# Patient Record
Sex: Male | Born: 1955 | Race: White | Hispanic: No | Marital: Single | Smoking: Current every day smoker
Health system: Southern US, Community
[De-identification: ages and names within clinical notes are randomized; demographics above are authoritative.]

## PROBLEM LIST (undated history)

## (undated) DIAGNOSIS — B1920 Unspecified viral hepatitis C without hepatic coma: Secondary | ICD-10-CM

## (undated) DIAGNOSIS — Z550 Illiteracy and low-level literacy: Secondary | ICD-10-CM

## (undated) DIAGNOSIS — C801 Malignant (primary) neoplasm, unspecified: Secondary | ICD-10-CM

## (undated) DIAGNOSIS — D649 Anemia, unspecified: Secondary | ICD-10-CM

## (undated) DIAGNOSIS — F419 Anxiety disorder, unspecified: Secondary | ICD-10-CM

## (undated) DIAGNOSIS — Z789 Other specified health status: Secondary | ICD-10-CM

## (undated) DIAGNOSIS — I739 Peripheral vascular disease, unspecified: Secondary | ICD-10-CM

## (undated) DIAGNOSIS — K219 Gastro-esophageal reflux disease without esophagitis: Secondary | ICD-10-CM

## (undated) DIAGNOSIS — F1090 Alcohol use, unspecified, uncomplicated: Secondary | ICD-10-CM

## (undated) DIAGNOSIS — I8500 Esophageal varices without bleeding: Secondary | ICD-10-CM

## (undated) DIAGNOSIS — I10 Essential (primary) hypertension: Secondary | ICD-10-CM

## (undated) DIAGNOSIS — Z720 Tobacco use: Secondary | ICD-10-CM

## (undated) DIAGNOSIS — Z7289 Other problems related to lifestyle: Secondary | ICD-10-CM

## (undated) DIAGNOSIS — B182 Chronic viral hepatitis C: Secondary | ICD-10-CM

## (undated) DIAGNOSIS — J449 Chronic obstructive pulmonary disease, unspecified: Secondary | ICD-10-CM

## (undated) DIAGNOSIS — M1990 Unspecified osteoarthritis, unspecified site: Secondary | ICD-10-CM

## (undated) DIAGNOSIS — K7460 Unspecified cirrhosis of liver: Secondary | ICD-10-CM

## (undated) DIAGNOSIS — F1010 Alcohol abuse, uncomplicated: Secondary | ICD-10-CM

## (undated) DIAGNOSIS — R188 Other ascites: Secondary | ICD-10-CM

## (undated) HISTORY — PX: PARACENTESIS: SHX844

## (undated) HISTORY — PX: SPINE SURGERY: SHX786

## (undated) HISTORY — PX: BACK SURGERY: SHX140

---

## 2004-03-03 ENCOUNTER — Inpatient Hospital Stay: Payer: Self-pay | Admitting: Internal Medicine

## 2004-03-03 ENCOUNTER — Other Ambulatory Visit: Payer: Self-pay

## 2006-06-30 ENCOUNTER — Ambulatory Visit: Payer: Self-pay | Admitting: Pain Medicine

## 2006-07-07 ENCOUNTER — Emergency Department: Payer: Self-pay | Admitting: Emergency Medicine

## 2006-07-07 ENCOUNTER — Ambulatory Visit: Payer: Self-pay | Admitting: Pain Medicine

## 2006-07-16 ENCOUNTER — Emergency Department: Payer: Self-pay | Admitting: Emergency Medicine

## 2006-07-26 ENCOUNTER — Ambulatory Visit: Payer: Self-pay | Admitting: Pain Medicine

## 2007-01-21 ENCOUNTER — Ambulatory Visit: Payer: Self-pay | Admitting: Family Medicine

## 2010-05-12 ENCOUNTER — Ambulatory Visit: Payer: Self-pay | Admitting: Internal Medicine

## 2011-12-07 ENCOUNTER — Emergency Department: Payer: Self-pay | Admitting: Emergency Medicine

## 2011-12-07 LAB — CBC
HCT: 46.2 % (ref 40.0–52.0)
HGB: 16.7 g/dL (ref 13.0–18.0)
MCH: 33.4 pg (ref 26.0–34.0)
MCHC: 36.1 g/dL — ABNORMAL HIGH (ref 32.0–36.0)
Platelet: 147 10*3/uL — ABNORMAL LOW (ref 150–440)

## 2011-12-07 LAB — COMPREHENSIVE METABOLIC PANEL
Alkaline Phosphatase: 114 U/L (ref 50–136)
BUN: 10 mg/dL (ref 7–18)
Bilirubin,Total: 0.4 mg/dL (ref 0.2–1.0)
Chloride: 100 mmol/L (ref 98–107)
Co2: 23 mmol/L (ref 21–32)
Creatinine: 0.67 mg/dL (ref 0.60–1.30)
EGFR (African American): >60
EGFR (Non-African Amer.): >60
Osmolality: 266 (ref 275–301)
Sodium: 133 mmol/L — ABNORMAL LOW (ref 136–145)

## 2011-12-07 LAB — PROTIME-INR: INR: 0.9

## 2011-12-16 ENCOUNTER — Ambulatory Visit: Payer: Self-pay | Admitting: Family Medicine

## 2013-02-24 DIAGNOSIS — G894 Chronic pain syndrome: Secondary | ICD-10-CM | POA: Insufficient documentation

## 2015-07-02 DIAGNOSIS — B182 Chronic viral hepatitis C: Secondary | ICD-10-CM | POA: Insufficient documentation

## 2016-09-07 ENCOUNTER — Other Ambulatory Visit: Payer: Self-pay | Admitting: Gastroenterology

## 2016-09-07 DIAGNOSIS — R945 Abnormal results of liver function studies: Principal | ICD-10-CM

## 2016-09-07 DIAGNOSIS — B182 Chronic viral hepatitis C: Secondary | ICD-10-CM

## 2016-09-07 DIAGNOSIS — R7989 Other specified abnormal findings of blood chemistry: Secondary | ICD-10-CM

## 2016-09-16 ENCOUNTER — Ambulatory Visit: Payer: Medicaid Other

## 2016-12-24 ENCOUNTER — Emergency Department: Payer: Medicaid Other

## 2016-12-24 ENCOUNTER — Encounter: Payer: Self-pay | Admitting: Emergency Medicine

## 2016-12-24 ENCOUNTER — Inpatient Hospital Stay
Admission: EM | Admit: 2016-12-24 | Discharge: 2016-12-27 | DRG: 433 | Disposition: A | Discharge status: Home / Self Care | Payer: Medicaid Other | Attending: Internal Medicine | Admitting: Internal Medicine

## 2016-12-24 DIAGNOSIS — D6959 Other secondary thrombocytopenia: Secondary | ICD-10-CM | POA: Diagnosis present

## 2016-12-24 DIAGNOSIS — K5900 Constipation, unspecified: Secondary | ICD-10-CM | POA: Diagnosis present

## 2016-12-24 DIAGNOSIS — Z7289 Other problems related to lifestyle: Secondary | ICD-10-CM

## 2016-12-24 DIAGNOSIS — R39198 Other difficulties with micturition: Secondary | ICD-10-CM | POA: Diagnosis not present

## 2016-12-24 DIAGNOSIS — J441 Chronic obstructive pulmonary disease with (acute) exacerbation: Secondary | ICD-10-CM | POA: Diagnosis present

## 2016-12-24 DIAGNOSIS — K7031 Alcoholic cirrhosis of liver with ascites: Secondary | ICD-10-CM | POA: Diagnosis present

## 2016-12-24 DIAGNOSIS — Z79899 Other long term (current) drug therapy: Secondary | ICD-10-CM

## 2016-12-24 DIAGNOSIS — I10 Essential (primary) hypertension: Secondary | ICD-10-CM | POA: Diagnosis present

## 2016-12-24 DIAGNOSIS — R188 Other ascites: Secondary | ICD-10-CM | POA: Diagnosis present

## 2016-12-24 DIAGNOSIS — R109 Unspecified abdominal pain: Secondary | ICD-10-CM

## 2016-12-24 DIAGNOSIS — D684 Acquired coagulation factor deficiency: Secondary | ICD-10-CM | POA: Diagnosis present

## 2016-12-24 DIAGNOSIS — F17210 Nicotine dependence, cigarettes, uncomplicated: Secondary | ICD-10-CM | POA: Diagnosis present

## 2016-12-24 DIAGNOSIS — R1084 Generalized abdominal pain: Secondary | ICD-10-CM

## 2016-12-24 DIAGNOSIS — B182 Chronic viral hepatitis C: Secondary | ICD-10-CM | POA: Diagnosis present

## 2016-12-24 HISTORY — DX: Unspecified viral hepatitis C without hepatic coma: B19.20

## 2016-12-24 HISTORY — DX: Essential (primary) hypertension: I10

## 2016-12-24 HISTORY — DX: Chronic obstructive pulmonary disease, unspecified: J44.9

## 2016-12-24 LAB — COMPREHENSIVE METABOLIC PANEL
ALBUMIN: 3.8 g/dL (ref 3.5–5.0)
ALK PHOS: 77 U/L (ref 38–126)
ALT: 46 U/L (ref 17–63)
ANION GAP: 9 (ref 5–15)
AST: 74 U/L — ABNORMAL HIGH (ref 15–41)
BILIRUBIN TOTAL: 3.3 mg/dL — AB (ref 0.3–1.2)
BUN: 7 mg/dL (ref 6–20)
CALCIUM: 9.2 mg/dL (ref 8.9–10.3)
CO2: 27 mmol/L (ref 22–32)
CREATININE: 0.76 mg/dL (ref 0.61–1.24)
Chloride: 94 mmol/L — ABNORMAL LOW (ref 101–111)
GFR calc Af Amer: >60 mL/min (ref >60)
GFR calc non Af Amer: >60 mL/min (ref >60)
GLUCOSE: 114 mg/dL — AB (ref 65–99)
Potassium: 3.6 mmol/L (ref 3.5–5.1)
SODIUM: 130 mmol/L — AB (ref 135–145)
TOTAL PROTEIN: 8.3 g/dL — AB (ref 6.5–8.1)

## 2016-12-24 LAB — CBC
HCT: 43.7 % (ref 40.0–52.0)
Hemoglobin: 15.3 g/dL (ref 13.0–18.0)
MCH: 32.6 pg (ref 26.0–34.0)
MCHC: 35 g/dL (ref 32.0–36.0)
MCV: 93.2 fL (ref 80.0–100.0)
PLATELETS: 123 10*3/uL — AB (ref 150–440)
RBC: 4.69 MIL/uL (ref 4.40–5.90)
RDW: 14.3 % (ref 11.5–14.5)
WBC: 7.6 10*3/uL (ref 3.8–10.6)

## 2016-12-24 LAB — PATHOLOGIST SMEAR REVIEW

## 2016-12-24 LAB — PROTIME-INR
INR: 1.26
Prothrombin Time: 15.7 seconds — ABNORMAL HIGH (ref 11.4–15.2)

## 2016-12-24 LAB — AMYLASE, PLEURAL OR PERITONEAL FLUID: Amylase, Fluid: 14 U/L

## 2016-12-24 LAB — BODY FLUID CELL COUNT WITH DIFFERENTIAL
EOS FL: 0 %
Lymphs, Fluid: 56 %
Monocyte-Macrophage-Serous Fluid: 32 %
NEUTROPHIL FLUID: 12 %
Total Nucleated Cell Count, Fluid: 294 cu mm

## 2016-12-24 LAB — URINALYSIS, COMPLETE (UACMP) WITH MICROSCOPIC
Bacteria, UA: NONE SEEN
Bilirubin Urine: NEGATIVE
GLUCOSE, UA: NEGATIVE mg/dL
HGB URINE DIPSTICK: NEGATIVE
Ketones, ur: NEGATIVE mg/dL
Leukocytes, UA: NEGATIVE
NITRITE: NEGATIVE
PH: 6 (ref 5.0–8.0)
PROTEIN: NEGATIVE mg/dL
SPECIFIC GRAVITY, URINE: 1.014 (ref 1.005–1.030)
Squamous Epithelial / LPF: NONE SEEN

## 2016-12-24 LAB — ALBUMIN, PLEURAL OR PERITONEAL FLUID

## 2016-12-24 LAB — TROPONIN I: Troponin I: <0.03 ng/mL (ref <0.03)

## 2016-12-24 LAB — APTT: APTT: 31 s (ref 24–36)

## 2016-12-24 LAB — LIPASE, BLOOD: Lipase: 20 U/L (ref 11–51)

## 2016-12-24 LAB — LACTATE DEHYDROGENASE, PLEURAL OR PERITONEAL FLUID: LD FL: 36 U/L — AB (ref 3–23)

## 2016-12-24 MED ORDER — METHYLPREDNISOLONE SODIUM SUCC 125 MG IJ SOLR
125.0000 mg | Freq: Once | INTRAMUSCULAR | Status: AC
  Administered 2016-12-24: 125 mg via INTRAVENOUS
  Filled 2016-12-24: qty 2

## 2016-12-24 MED ORDER — ALBUTEROL SULFATE (2.5 MG/3ML) 0.083% IN NEBU
2.5000 mg | INHALATION_SOLUTION | RESPIRATORY_TRACT | Status: DC | PRN
  Administered 2016-12-24: 2.5 mg via RESPIRATORY_TRACT
  Filled 2016-12-24: qty 3

## 2016-12-24 MED ORDER — CLONAZEPAM 0.5 MG PO TABS
0.5000 mg | ORAL_TABLET | Freq: Two times a day (BID) | ORAL | Status: DC | PRN
  Administered 2016-12-24 – 2016-12-27 (×6): 0.5 mg via ORAL
  Filled 2016-12-24 (×8): qty 1

## 2016-12-24 MED ORDER — MORPHINE SULFATE (PF) 4 MG/ML IV SOLN
4.0000 mg | Freq: Once | INTRAVENOUS | Status: AC
  Administered 2016-12-24: 4 mg via INTRAVENOUS
  Filled 2016-12-24: qty 1

## 2016-12-24 MED ORDER — TRAMADOL HCL 50 MG PO TABS
50.0000 mg | ORAL_TABLET | Freq: Four times a day (QID) | ORAL | Status: DC | PRN
  Administered 2016-12-24 – 2016-12-27 (×7): 50 mg via ORAL
  Filled 2016-12-24 (×7): qty 1

## 2016-12-24 MED ORDER — ONDANSETRON HCL 4 MG/2ML IJ SOLN
4.0000 mg | Freq: Once | INTRAMUSCULAR | Status: AC
  Administered 2016-12-24: 4 mg via INTRAVENOUS
  Filled 2016-12-24: qty 2

## 2016-12-24 MED ORDER — PREDNISONE 50 MG PO TABS
50.0000 mg | ORAL_TABLET | Freq: Every day | ORAL | Status: DC
  Administered 2016-12-25 – 2016-12-27 (×3): 50 mg via ORAL
  Filled 2016-12-24 (×3): qty 1

## 2016-12-24 MED ORDER — MOMETASONE FURO-FORMOTEROL FUM 200-5 MCG/ACT IN AERO: 2.0000 | INHALATION_SPRAY | Freq: Two times a day (BID) | RESPIRATORY_TRACT | Status: DC

## 2016-12-24 MED ORDER — SPIRONOLACTONE 25 MG PO TABS
25.0000 mg | ORAL_TABLET | Freq: Every day | ORAL | Status: DC
  Administered 2016-12-24 – 2016-12-25 (×2): 25 mg via ORAL
  Filled 2016-12-24 (×2): qty 1

## 2016-12-24 MED ORDER — FUROSEMIDE 10 MG/ML IJ SOLN
20.0000 mg | Freq: Two times a day (BID) | INTRAMUSCULAR | Status: DC
  Administered 2016-12-24 – 2016-12-27 (×6): 20 mg via INTRAVENOUS
  Filled 2016-12-24 (×6): qty 4

## 2016-12-24 MED ORDER — NICOTINE 7 MG/24HR TD PT24
7.0000 mg | MEDICATED_PATCH | Freq: Every day | TRANSDERMAL | Status: DC
  Administered 2016-12-24 – 2016-12-27 (×5): 7 mg via TRANSDERMAL
  Filled 2016-12-24 (×5): qty 1

## 2016-12-24 MED ORDER — ONDANSETRON HCL 4 MG PO TABS: 4.0000 mg | ORAL_TABLET | Freq: Four times a day (QID) | ORAL | Status: DC | PRN

## 2016-12-24 MED ORDER — DOCUSATE SODIUM 100 MG PO CAPS
100.0000 mg | ORAL_CAPSULE | Freq: Two times a day (BID) | ORAL | Status: DC
  Administered 2016-12-24 – 2016-12-25 (×3): 100 mg via ORAL
  Filled 2016-12-24 (×3): qty 1

## 2016-12-24 MED ORDER — DEXTROSE 5 % IV SOLN
500.0000 mg | Freq: Once | INTRAVENOUS | Status: AC
  Administered 2016-12-24: 500 mg via INTRAVENOUS
  Filled 2016-12-24: qty 500

## 2016-12-24 MED ORDER — MOMETASONE FURO-FORMOTEROL FUM 200-5 MCG/ACT IN AERO
2.0000 | INHALATION_SPRAY | Freq: Two times a day (BID) | RESPIRATORY_TRACT | Status: DC
  Administered 2016-12-24 – 2016-12-25 (×2): 2 via RESPIRATORY_TRACT
  Filled 2016-12-24: qty 8.8

## 2016-12-24 MED ORDER — ONDANSETRON HCL 4 MG/2ML IJ SOLN: 4.0000 mg | Freq: Four times a day (QID) | INTRAMUSCULAR | Status: DC | PRN

## 2016-12-24 MED ORDER — IPRATROPIUM-ALBUTEROL 0.5-2.5 (3) MG/3ML IN SOLN
9.0000 mL | Freq: Once | RESPIRATORY_TRACT | Status: AC
  Administered 2016-12-24: 9 mL via RESPIRATORY_TRACT
  Filled 2016-12-24: qty 9

## 2016-12-24 MED ORDER — ALBUTEROL SULFATE HFA 108 (90 BASE) MCG/ACT IN AERS: 2.0000 | INHALATION_SPRAY | RESPIRATORY_TRACT | Status: DC | PRN

## 2016-12-24 NOTE — ED Provider Notes (Signed)
[facility] Emergency Department Provider Note  ____________________________________________   First MD Initiated Contact with Patient 12/24/16 0840     (approximate)  I have reviewed the triage vital signs and the nursing notes.   HISTORY  Chief Complaint Abdominal Pain and Shortness of Breath   HPI Bradley Dunlap is a 61 y.o. male with a history of hepatitis C as well as liver cirrhosis was presenting to the emergency department with worsening abdominal pain and distention over the past several weeks. He says that it is now gotten to the point where he is having trouble breathing. He says his pain is a "12 out of 10." He denies ever having a paracentesis in the past. Says that he hasn't had anything to drink for one half months at this time.   Past Medical History:  Diagnosis Date  . COPD (chronic obstructive pulmonary disease) ([place])   . Hepatitis C   . Hypertension     There are no active problems to display for this patient.   No past surgical history on file.  Prior to Admission medications   Not on File    Allergies Patient has no known allergies.  No family history on file.  Social History Social History  Substance Use Topics  . Smoking status: Not on file  . Smokeless tobacco: Not on file  . Alcohol use Not on file    Review of Systems  Level V caveat secondary to altered mental status.  ____________________________________________   PHYSICAL EXAM:  VITAL SIGNS: ED Triage Vitals [12/24/16 0822]  Enc Vitals Group     BP 130/82     Pulse Rate (!) 110     Resp (!) 24     Temp 97.9 F (36.6 C)     Temp Source Oral     SpO2 97 %     Weight 230 lb (104.3 kg)     Height 5\' 8"  (1.727 m)     Head Circumference      Peak Flow      Pain Score 10     Pain Loc      Pain Edu?      Excl. in [place]?     Constitutional: Alert and oriented. Well appearing and in no acute distress. Eyes: Conjunctivae are normal.  Head:  Atraumatic. Nose: No congestion/rhinnorhea. Mouth/Throat: Mucous membranes are moist.  Neck: No stridor.   Cardiovascular: Normal rate, regular rhythm. Grossly normal heart sounds.   Respiratory: Normal respiratory effort.  No retractions. Wheezing throughout all fields. Prolonged history phase. Gastrointestinal: Tense with a large amount of distention and diffusely tender. No CVA tenderness. Musculoskeletal: No lower extremity tenderness nor edema.  No joint effusions. Neurologic:  Normal speech and language. No gross focal neurologic deficits are appreciated. Skin:  Skin is warm, dry and intact. No rash noted. Psychiatric: Mood and affect are normal. Speech and behavior are normal.  ____________________________________________   LABS (all labs ordered are listed, but only abnormal results are displayed)  Labs Reviewed  COMPREHENSIVE METABOLIC PANEL - Abnormal; Notable for the following:       Result Value   Sodium 130 (*)    Chloride 94 (*)    Glucose, Bld 114 (*)    Total Protein 8.3 (*)    AST 74 (*)    Total Bilirubin 3.3 (*)    All other components within normal limits  CBC - Abnormal; Notable for the following:    Platelets 123 (*)  All other components within normal limits  LIPASE, BLOOD  URINALYSIS, COMPLETE (UACMP) WITH MICROSCOPIC  TROPONIN I  PROTIME-INR  APTT   ____________________________________________  EKG   ED ECG REPORT I, Journiee Feldkamp,  Youlanda Roys, the attending physician, personally viewed and interpreted this ECG.   Date: 12/24/2016  EKG Time: 0 852  Rate: 102  Rhythm: normal EKG, normal sinus rhythm, unchanged from previous tracings, sinus tachycardia. EKG machine read as atrial fibrillation but believe this is due to baseline disturbance. There T waves seen in V4, V5 and V6 before every QRS.  Axis: Normal  Intervals:nonspecific intraventricular conduction delay  ST&T Change: No ST segment elevation or depression. No abnormal T-wave  inversion.  ____________________________________________  RADIOLOGY  Low lung vitals without any acute cardiopulmonary disease. ____________________________________________   PROCEDURES  Procedure(s) performed:   Procedures  Critical Care performed:   ____________________________________________   INITIAL IMPRESSION / ASSESSMENT AND PLAN / ED COURSE  Pertinent labs & imaging results that were available during my care of the patient were reviewed by me and considered in my medical decision making (see chart for details).  ----------------------------------------- 12:17 PM on 12/24/2016 -----------------------------------------  Patient had paracentesis and is feeling especially improved but still with diffuse abdominal tenderness to palpation. Also with continual wheezing despite COPD treatment. Patient will be admitted to the hospital. Pending abdominal fluid labs at this time. Signed out to Dr. Posey Pronto. Patient is aware of the need for Mr. the hospital for further workup and is willing to comply. Likely COPD exacerbation from abdominal competition because of his large volume ascites.      ____________________________________________   FINAL CLINICAL IMPRESSION(S) / ED DIAGNOSES  Final diagnoses:  Abdominal pain  CPD. Abdominal ascites.    NEW MEDICATIONS STARTED DURING THIS VISIT:  New Prescriptions   No medications on file     Note:  This document was prepared using Dragon voice recognition software and may include unintentional dictation errors.     Orbie Pyo, MD 12/24/16 [phone]

## 2016-12-24 NOTE — H&P (Signed)
[facility] NAME: Mccrae Speciale    MR#:  [id number]  DATE OF BIRTH:  1956/01/27  DATE OF ADMISSION:  12/24/2016  PRIMARY CARE PHYSICIAN: Patient, No Pcp Per   REQUESTING/REFERRING PHYSICIAN: Dr Cinda Quest  CHIEF COMPLAINT:   Increasing shortness of breath and bloating but distended abdomen for 2 weeks HISTORY OF PRESENT ILLNESS:  Galo Sayed  is a 61 y.o. male with a known history of cirrhosis of liver secondary to hepatitis C, COPD with ongoing tobacco abuse, hypertension comes to the emergency room with increasing shortness of breath and abdominal distention. Patient reports difficulty walking around given his significant abdominal distention. He was found to have massive ascites and underwent paracentesis ultrasound-guided with 5 L. Patient is still quite distended with tight and has significant amount of fluid still remaining. He was wheezing earlier received breathing treatments. He is being admitted with acute COPD exacerbation with massive ascites secondary to cirrhosis of liver due to hepatitis C  PAST MEDICAL HISTORY:   Past Medical History:  Diagnosis Date  . COPD (chronic obstructive pulmonary disease) ([place])   . Hepatitis C   . Hypertension     PAST SURGICAL HISTOIRY:   Past Surgical History:  Procedure Laterality Date  . BACK SURGERY      SOCIAL HISTORY:   Social History  Substance Use Topics  . Smoking status: Current Every Day Smoker    Packs/day: 0.50    Types: Cigarettes  . Smokeless tobacco: Former Systems developer  . Alcohol use 1.8 oz/week    3 Cans of beer per week     Comment: no alcohol in 4 or 5 weeks    FAMILY HISTORY:  History reviewed. No pertinent family history.  DRUG ALLERGIES:  No Known Allergies  REVIEW OF SYSTEMS:  Review of Systems  Constitutional: Negative for chills, fever and weight loss.  HENT: Negative for ear discharge, ear pain and nosebleeds.   Eyes: Negative for blurred  vision, pain and discharge.  Respiratory: Positive for shortness of breath. Negative for sputum production, wheezing and stridor.   Cardiovascular: Negative for chest pain, palpitations, orthopnea and PND.  Gastrointestinal: Positive for abdominal pain. Negative for diarrhea, nausea and vomiting.  Genitourinary: Negative for frequency and urgency.  Musculoskeletal: Negative for back pain and joint pain.  Neurological: Positive for weakness. Negative for sensory change, speech change and focal weakness.  Psychiatric/Behavioral: Negative for depression and hallucinations. The patient is not nervous/anxious.      MEDICATIONS AT HOME:   Prior to Admission medications   Medication Sig Start Date End Date Taking? Authorizing Provider  Fluticasone-Salmeterol (ADVAIR DISKUS) 250-50 MCG/DOSE AEPB Inhale 1 puff into the lungs 2 (two) times daily. 08/14/16  Yes [provider]  albuterol (PROVENTIL HFA;VENTOLIN HFA) 108 (90 Base) MCG/ACT inhaler Inhale 2 puffs into the lungs every 4 (four) hours as needed for wheezing. 08/14/16   [provider]  albuterol (PROVENTIL) (2.5 MG/3ML) 0.083% nebulizer solution Take 2.5 mg by nebulization every 4 (four) hours as needed for wheezing. 08/14/16   [provider]  clonazePAM (KLONOPIN) 0.5 MG tablet Take 0.5 mg by mouth 2 (two) times daily as needed for anxiety. 08/14/16   [provider]      VITAL SIGNS:  Blood pressure (!) 125/91, pulse 91, temperature (!) 97.5 F (36.4 C), temperature source Oral, resp. rate 18, height 5\' 8"  (1.727 m), weight 104.3 kg (230 lb), SpO2 95 %.  PHYSICAL EXAMINATION:  GENERAL:  61 y.o.-year-old patient lying in the bed with no acute distress. dishevelrd EYES: Pupils equal, round, reactive to light and accommodation. No scleral icterus. Extraocular muscles intact.  HEENT: Head atraumatic, normocephalic. Oropharynx and nasopharynx clear.  NECK:  Supple, no jugular venous distention. No thyroid  enlargement, no tenderness.  LUNGS: distantbreath sounds bilaterally, scattered wheezing, no rales,rhonchi or crepitation. No use of accessory muscles of respiration.  CARDIOVASCULAR: S1, S2 normal. No murmurs, rubs, or gallops.  ABDOMEN: Soft, nontender, nondistended. Bowel sounds present. No organomegaly or mass.  EXTREMITIES: No pedal edema, cyanosis, or clubbing.  NEUROLOGIC: Cranial nerves II through XII are intact. Muscle strength 5/5 in all extremities. Sensation intact. Gait not checked.  PSYCHIATRIC: The patient is alert and oriented x 3.  SKIN: No obvious rash, lesion, or ulcer.   LABORATORY PANEL:   CBC  Recent Labs Lab 12/24/16 0825  WBC 7.6  HGB 15.3  HCT 43.7  PLT 123*   ------------------------------------------------------------------------------------------------------------------  Chemistries   Recent Labs Lab 12/24/16 0825  NA 130*  K 3.6  CL 94*  CO2 27  GLUCOSE 114*  BUN 7  CREATININE 0.76  CALCIUM 9.2  AST 74*  ALT 46  ALKPHOS 77  BILITOT 3.3*   ------------------------------------------------------------------------------------------------------------------  Cardiac Enzymes  Recent Labs Lab 12/24/16 0854  TROPONINI <0.03   ------------------------------------------------------------------------------------------------------------------  RADIOLOGY:  Dg Chest 1 View  Result Date: 12/24/2016 CLINICAL DATA:  Abdominal pain. Shortness of breath. History of cirrhosis. EXAM: CHEST 1 VIEW COMPARISON:  12/07/2011. FINDINGS: Mediastinum hilar structures normal. Heart size normal. No focal infiltrate. Low lung volumes. No pleural effusion or pneumothorax. IMPRESSION: Low lung volumes.  No acute cardiopulmonary disease identified. Electronically Signed   By: Marcello Moores  Register   On: 12/24/2016 09:07    EKG:    IMPRESSION AND PLAN:   Shawntez Dickison  is a 61 y.o. male with a known history of cirrhosis of liver secondary to hepatitis C, COPD with  ongoing tobacco abuse, hypertension comes to the emergency room with increasing shortness of breath and abdominal distention  1. Massive ascites secondary to certain known history of cirrhosis of liver secondary to chronic hepatitis C -patient has been seen with [facility] GI clinic -he underwent ultrasound-guided paracentesis with removal of 5 L of fluid. -we'll order another ultrasound-guided paracentesis for tomorrow -Started on Lasix IV 20 twice a day and spironolactone -Patient will need to follow up with  GI clinic routine paracentesis if he feels upquickly  2.Acute COPD exacerbation with ongoing tobacco abuse -Received IV Solu-Medrol in the ER, continue nebulizers or inhalers -Oral prednisone taper  3. Hypertension -currently on Lasix and spironolactone -Add BP meds depending on blood pressure  4.chronic hyperbilirubinemia secondary to chronic cirrhosis of liver  5. Hypercoagulability secondary to cirrhosis of liver -No active bleeding  6. DVT prophylaxis  SCD  All the records are reviewed and case discussed with ED provider. Management plans discussed with the patient, family and they are in agreement.  CODE STATUS: full  TOTAL TIME TAKING CARE OF THIS PATIENT: *50* minutes.    Avacyn Kloosterman M.D on 12/24/2016 at 4:05 PM  Between 7am to 6pm - Pager - [phone]  After 6pm go to [email or web address] - password [email or web address] [email or web address]  Office  [phone]  CC: Primary care physician; Patient, No Pcp Per

## 2016-12-24 NOTE — ED Notes (Signed)
Pt returned from [place], resting in bed in no distress

## 2016-12-24 NOTE — Progress Notes (Signed)
Patient smokes 1/2 PPD or less.  Order received for nicoderm

## 2016-12-24 NOTE — ED Notes (Signed)
Patient transported to Ultrasound 

## 2016-12-24 NOTE — ED Triage Notes (Signed)
Pt reports increasing fluid collection on abdomen. Pt reports abdominal pain and shortness of breath. Pt reports history of cirrhosis and hepatitis C.

## 2016-12-24 NOTE — ED Notes (Signed)
Pt resting in bed, denies any needs, awake and alert

## 2016-12-25 ENCOUNTER — Inpatient Hospital Stay: Payer: Medicaid Other

## 2016-12-25 LAB — BASIC METABOLIC PANEL
Anion gap: 7 (ref 5–15)
BUN: 9 mg/dL (ref 6–20)
CALCIUM: 8.8 mg/dL — AB (ref 8.9–10.3)
CHLORIDE: 96 mmol/L — AB (ref 101–111)
CO2: 29 mmol/L (ref 22–32)
CREATININE: 0.7 mg/dL (ref 0.61–1.24)
Glucose, Bld: 150 mg/dL — ABNORMAL HIGH (ref 65–99)
Potassium: 3.8 mmol/L (ref 3.5–5.1)
SODIUM: 132 mmol/L — AB (ref 135–145)

## 2016-12-25 MED ORDER — BUDESONIDE 0.5 MG/2ML IN SUSP
0.5000 mg | Freq: Two times a day (BID) | RESPIRATORY_TRACT | Status: DC
  Administered 2016-12-25 – 2016-12-27 (×5): 0.5 mg via RESPIRATORY_TRACT
  Filled 2016-12-25 (×5): qty 2

## 2016-12-25 MED ORDER — LACTULOSE 10 GM/15ML PO SOLN
30.0000 g | Freq: Every day | ORAL | Status: DC
  Administered 2016-12-25: 30 g via ORAL
  Filled 2016-12-25: qty 60

## 2016-12-25 MED ORDER — CEFTRIAXONE SODIUM 1 G IJ SOLR
1.0000 g | Freq: Every day | INTRAMUSCULAR | Status: DC
  Administered 2016-12-25 – 2016-12-26 (×2): 1 g via INTRAVENOUS
  Filled 2016-12-25 (×3): qty 10

## 2016-12-25 MED ORDER — SPIRONOLACTONE 25 MG PO TABS
25.0000 mg | ORAL_TABLET | Freq: Two times a day (BID) | ORAL | Status: DC
  Administered 2016-12-25 – 2016-12-27 (×4): 25 mg via ORAL
  Filled 2016-12-25 (×4): qty 1

## 2016-12-25 MED ORDER — ALBUTEROL SULFATE (2.5 MG/3ML) 0.083% IN NEBU
2.5000 mg | INHALATION_SOLUTION | Freq: Four times a day (QID) | RESPIRATORY_TRACT | Status: DC
  Administered 2016-12-25 – 2016-12-27 (×7): 2.5 mg via RESPIRATORY_TRACT
  Filled 2016-12-25 (×9): qty 3

## 2016-12-25 NOTE — Progress Notes (Signed)
Patient ID: Bradley Dunlap, male   DOB: 1955-12-18, 61 y.o.   MRN: [id number]  [employer] PROGRESS NOTE  Bradley Dunlap [id number] DOB: 1955/07/16 DOA: 12/24/2016 PCP: Patient, No Pcp Per  HPI/Subjective: The patient had 8.6 L taken off with paracentesis yesterday and today. He still feels distended in his abdomen. He is urinating well. He has a history of COPD and wheezing. He's complains of constipation.  Objective: Vitals:   12/25/16 0943 12/25/16 1227  BP: 108/65 119/68  Pulse: 84 81  Resp: 18   Temp: 98 F (36.7 C) 97.8 F (36.6 C)  SpO2: 93% 93%    Filed Weights   12/24/16 0822  Weight: 104.3 kg (230 lb)    ROS: Review of Systems  Constitutional: Negative for chills and fever.  Eyes: Negative for blurred vision.  Respiratory: Positive for shortness of breath and wheezing. Negative for cough.   Cardiovascular: Negative for chest pain.  Gastrointestinal: Positive for abdominal pain and constipation. Negative for diarrhea, nausea and vomiting.  Genitourinary: Negative for dysuria.  Musculoskeletal: Negative for joint pain.  Neurological: Negative for dizziness and headaches.   Exam: Physical Exam  Constitutional: He is oriented to person, place, and time.  HENT:  Nose: No mucosal edema.  Mouth/Throat: No oropharyngeal exudate or posterior oropharyngeal edema.  Eyes: Pupils are equal, round, and reactive to light. Conjunctivae, EOM and lids are normal.  Neck: No JVD present. Carotid bruit is not present. No edema present. No thyroid mass and no thyromegaly present.  Cardiovascular: S1 normal and S2 normal.  Exam reveals no gallop.   No murmur heard. Pulses:      Dorsalis pedis pulses are 2+ on the right side, and 2+ on the left side.  Respiratory: No respiratory distress. He has decreased breath sounds in the right middle field, the right lower field, the left middle field and the left lower field. He has wheezes in the right middle field, the right lower  field, the left middle field and the left lower field. He has no rhonchi. He has no rales.  GI: Soft. Bowel sounds are normal. He exhibits distension. There is no tenderness.  Musculoskeletal:       Right shoulder: He exhibits no swelling.  Lymphadenopathy:    He has no cervical adenopathy.  Neurological: He is alert and oriented to person, place, and time. No cranial nerve deficit.  Skin: Skin is warm. No rash noted. Nails show no clubbing.  Psychiatric: He has a normal mood and affect.      Data Reviewed: Basic Metabolic Panel:  Recent Labs Lab 12/24/16 0825 12/25/16 0322  NA 130* 132*  K 3.6 3.8  CL 94* 96*  CO2 27 29  GLUCOSE 114* 150*  BUN 7 9  CREATININE 0.76 0.70  CALCIUM 9.2 8.8*   Liver Function Tests:  Recent Labs Lab 12/24/16 0825  AST 74*  ALT 46  ALKPHOS 77  BILITOT 3.3*  PROT 8.3*  ALBUMIN 3.8    Recent Labs Lab 12/24/16 0825  LIPASE 20   CBC:  Recent Labs Lab 12/24/16 0825  WBC 7.6  HGB 15.3  HCT 43.7  MCV 93.2  PLT 123*   Cardiac Enzymes:  Recent Labs Lab 12/24/16 0854  TROPONINI <0.03    Recent Results (from the past 240 hour(s))  Body fluid culture     Status: None (Preliminary result)   Collection Time: 12/24/16 10:18 AM  Result Value Ref Range Status   Specimen Description PERITONEAL  Final  Special Requests NONE  Final   Gram Stain   Final    ABUNDANT WBC PRESENT, PREDOMINANTLY MONONUCLEAR NO ORGANISMS SEEN    Culture   Final    NO GROWTH < 24 HOURS Performed at [facility] [address], [place], [place] [address]    Report Status PENDING  Incomplete     Studies: Dg Chest 1 View  Result Date: 12/24/2016 CLINICAL DATA:  Abdominal pain. Shortness of breath. History of cirrhosis. EXAM: CHEST 1 VIEW COMPARISON:  12/07/2011. FINDINGS: Mediastinum hilar structures normal. Heart size normal. No focal infiltrate. Low lung volumes. No pleural effusion or pneumothorax. IMPRESSION: Low lung volumes.  No acute  cardiopulmonary disease identified. Electronically Signed   By: Marcello Moores  Register   On: 12/24/2016 09:07   US Paracentesis  Result Date: 12/25/2016 INDICATION: History of cirrhosis secondary to hepatitis C. Ascites present. Request for therapeutic paracentesis with a limit of 5L. EXAM: ULTRASOUND GUIDED THERAPEUTIC PARACENTESIS MEDICATIONS: 1% lidocaine COMPLICATIONS: None immediate. PROCEDURE: Informed written consent was obtained from the patient after a discussion of the risks, benefits and alternatives to treatment. A timeout was performed prior to the initiation of the procedure. Initial ultrasound scanning demonstrates a moderate amount of ascites within the right lower abdominal quadrant. The right lower abdomen was prepped and draped in the usual sterile fashion. 1% lidocaine was used for local anesthesia. Following this, a Safe-T-Centesis catheter was introduced. An ultrasound image was saved for documentation purposes. The paracentesis was performed. The catheter was removed and a dressing was applied. The patient tolerated the procedure well without immediate post procedural complication. FINDINGS: A total of approximately 3.8 L of serous fluid was removed. IMPRESSION: Successful ultrasound-guided paracentesis yielding 3.8 liters of peritoneal fluid. Read by: Saverio Danker, PA-C Electronically Signed   By: Markus Daft M.D.   On: 12/25/2016 09:30   US Paracentesis  Result Date: 12/24/2016 INDICATION: 61 year old male with large volume symptomatic ascites. EXAM: ULTRASOUND GUIDED  PARACENTESIS MEDICATIONS: None. COMPLICATIONS: None immediate. PROCEDURE: Informed written consent was obtained from the patient after a discussion of the risks, benefits and alternatives to treatment. A timeout was performed prior to the initiation of the procedure. Initial ultrasound scanning demonstrates a large amount of ascites within the right lower abdominal quadrant. The right lower abdomen was prepped and draped in  the usual sterile fashion. 1% lidocaine with epinephrine was used for local anesthesia. Following this, a 6 Fr Safe-T-Centesis catheter was introduced. An ultrasound image was saved for documentation purposes. The paracentesis was performed. The catheter was removed and a dressing was applied. The patient tolerated the procedure well without immediate post procedural complication. FINDINGS: A total of approximately 5000 mL of clear yellow ascitic fluid was removed. Samples were sent to the laboratory as requested by the clinical team. IMPRESSION: Successful ultrasound-guided paracentesis yielding 5 liters of peritoneal fluid. Electronically Signed   By: Jacqulynn Cadet M.D.   On: 12/24/2016 16:47    Scheduled Meds: . albuterol  2.5 mg Nebulization Q6H  . budesonide (PULMICORT) nebulizer solution  0.5 mg Nebulization BID  . furosemide  20 mg Intravenous BID  . lactulose  30 g Oral Daily  . nicotine  7 mg Transdermal Daily  . predniSONE  50 mg Oral Q breakfast  . spironolactone  25 mg Oral BID    Assessment/Plan:  1. Massive ascites with history of cirrhosis of the liver and hepatitis C. Patient is status post 2 paracentesis removing 8.6 L. Increase spironolactone to twice a day  dosing. Continue IV Lasix. Will need follow-up in the gastroenterology clinic to get order for paracentesis on a regular basis. We'll give empiric antibiotics for high white blood cell count in fluid. 2. COPD exacerbation. Continue prednisone nebulizer treatments. Add budesonide nebulizers. 3. Essential hypertension on Lasix and spironolactone 4. 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     Disposition Plan: Likely home over the weekend  Time spent: 28 minutes  [place], [place]

## 2016-12-25 NOTE — Plan of Care (Signed)
Problem: Bowel/Gastric: Goal: Will not experience complications related to bowel motility Outcome: Not Progressing Abdominal distension continues.  Pain relieved with prn meds this shift.

## 2016-12-25 NOTE — Care Management (Signed)
RNCM consult for new PCP.  Patient has Medicaid and has [facility] listed as his PCP.  Patient states that he wishes to switch practices.  I showed the patient the phone number on his card to call, in order to contact the Medicaid office to request to switch practices.   Patient expressers concerns on obtaining medication .  Patient states that he only fills his prescriptions at [facility] pharmacy.  RNCM called [facility].  Saturday they close at 1pm, Sunday - Closed, Monday are closed for the Holiday.  I have informed the patient and MD that should the patient discharge after the pharmacy closes tomorrow he will have to take his prescriptions and his Medicaid card to a pharmacy of his choice and pick them up there.

## 2016-12-25 NOTE — Procedures (Signed)
Ultrasound-guided therapeutic paracentesis performed yielding 3.8 liters of serous colored fluid. No immediate complications.  Bradley Dunlap E 9:28 AM 12/25/2016

## 2016-12-26 LAB — BASIC METABOLIC PANEL
ANION GAP: 6 (ref 5–15)
BUN: 10 mg/dL (ref 6–20)
CHLORIDE: 99 mmol/L — AB (ref 101–111)
CO2: 29 mmol/L (ref 22–32)
Calcium: 8.6 mg/dL — ABNORMAL LOW (ref 8.9–10.3)
Creatinine, Ser: 0.75 mg/dL (ref 0.61–1.24)
GFR calc non Af Amer: >60 mL/min (ref >60)
Glucose, Bld: 107 mg/dL — ABNORMAL HIGH (ref 65–99)
POTASSIUM: 3.6 mmol/L (ref 3.5–5.1)
SODIUM: 134 mmol/L — AB (ref 135–145)

## 2016-12-26 MED ORDER — POLYETHYLENE GLYCOL 3350 17 G PO PACK
17.0000 g | PACK | Freq: Every day | ORAL | Status: DC
  Administered 2016-12-26 – 2016-12-27 (×2): 17 g via ORAL
  Filled 2016-12-26 (×2): qty 1

## 2016-12-26 MED ORDER — TAMSULOSIN HCL 0.4 MG PO CAPS
0.4000 mg | ORAL_CAPSULE | Freq: Every day | ORAL | Status: DC
  Administered 2016-12-26 – 2016-12-27 (×2): 0.4 mg via ORAL
  Filled 2016-12-26 (×2): qty 1

## 2016-12-26 MED ORDER — FLEET ENEMA 7-19 GM/118ML RE ENEM
1.0000 | ENEMA | Freq: Every day | RECTAL | Status: DC | PRN
  Administered 2016-12-27: 1 via RECTAL
  Filled 2016-12-26: qty 1

## 2016-12-26 NOTE — Progress Notes (Signed)
Patient ID: Bradley Dunlap, male   DOB: [birth year], 61 y.o.   MRN: [id number]  [facility] PROGRESS NOTE  SOLLIE VULTAGGIO GLO:[id number] DOB: 08-Jun-1955 DOA: 12/24/2016 PCP: Patient, No Pcp Per  HPI/Subjective: Patient's wheezing. Abdomen still distended. Patient states that he still has not had a bowel movement yet.  Objective: Vitals:   12/26/16 0759 12/26/16 1159  BP:  129/75  Pulse:  90  Resp:    Temp:  97.9 F (36.6 C)  SpO2: 92% 95%    Filed Weights   12/24/16 0822  Weight: 104.3 kg (230 lb)    ROS: Review of Systems  Constitutional: Negative for chills and fever.  Eyes: Negative for blurred vision.  Respiratory: Positive for shortness of breath and wheezing. Negative for cough.   Cardiovascular: Negative for chest pain.  Gastrointestinal: Positive for abdominal pain and constipation. Negative for diarrhea, nausea and vomiting.  Genitourinary: Negative for dysuria.  Musculoskeletal: Negative for joint pain.  Neurological: Negative for dizziness and headaches.   Exam: Physical Exam  Constitutional: He is oriented to person, place, and time.  HENT:  Nose: No mucosal edema.  Mouth/Throat: No oropharyngeal exudate or posterior oropharyngeal edema.  Eyes: Pupils are equal, round, and reactive to light. Conjunctivae, EOM and lids are normal.  Neck: No JVD present. Carotid bruit is not present. No edema present. No thyroid mass and no thyromegaly present.  Cardiovascular: S1 normal and S2 normal.  Exam reveals no gallop.   No murmur heard. Pulses:      Dorsalis pedis pulses are 2+ on the right side, and 2+ on the left side.  Respiratory: No respiratory distress. He has no wheezes. He has no rhonchi. He has no rales.  GI: Soft. Bowel sounds are normal. There is no tenderness.  Musculoskeletal:       Right ankle: He exhibits no swelling.       Left ankle: He exhibits no swelling.  Lymphadenopathy:    He has no cervical adenopathy.  Neurological: He is alert and  oriented to person, place, and time. No cranial nerve deficit.  Skin: Skin is warm. No rash noted. Nails show no clubbing.  Psychiatric: He has a normal mood and affect.      Data Reviewed: Basic Metabolic Panel:  Recent Labs Lab 12/24/16 0825 12/25/16 0322 12/26/16 0355  NA 130* 132* 134*  K 3.6 3.8 3.6  CL 94* 96* 99*  CO2 27 29 29   GLUCOSE 114* 150* 107*  BUN 7 9 10   CREATININE 0.76 0.70 0.75  CALCIUM 9.2 8.8* 8.6*   Liver Function Tests:  Recent Labs Lab 12/24/16 0825  AST 74*  ALT 46  ALKPHOS 77  BILITOT 3.3*  PROT 8.3*  ALBUMIN 3.8    Recent Labs Lab 12/24/16 0825  LIPASE 20   CBC:  Recent Labs Lab 12/24/16 0825  WBC 7.6  HGB 15.3  HCT 43.7  MCV 93.2  PLT 123*   Cardiac Enzymes:  Recent Labs Lab 12/24/16 0854  TROPONINI <0.03     Recent Results (from the past 240 hour(s))  Body fluid culture     Status: None (Preliminary result)   Collection Time: 12/24/16 10:18 AM  Result Value Ref Range Status   Specimen Description PERITONEAL  Final   Special Requests NONE  Final   Gram Stain   Final    ABUNDANT WBC PRESENT, PREDOMINANTLY MONONUCLEAR NO ORGANISMS SEEN    Culture   Final    NO GROWTH 2 DAYS Performed at  [facility], [place] [address] [place], [place] [address]    Report Status PENDING  Incomplete     Studies: US Paracentesis  Result Date: 12/25/2016 INDICATION: History of cirrhosis secondary to hepatitis C. Ascites present. Request for therapeutic paracentesis with a limit of 5L. EXAM: ULTRASOUND GUIDED THERAPEUTIC PARACENTESIS MEDICATIONS: 1% lidocaine COMPLICATIONS: None immediate. PROCEDURE: Informed written consent was obtained from the patient after a discussion of the risks, benefits and alternatives to treatment. A timeout was performed prior to the initiation of the procedure. Initial ultrasound scanning demonstrates a moderate amount of ascites within the right lower abdominal quadrant. The right lower abdomen was  prepped and draped in the usual sterile fashion. 1% lidocaine was used for local anesthesia. Following this, a Safe-T-Centesis catheter was introduced. An ultrasound image was saved for documentation purposes. The paracentesis was performed. The catheter was removed and a dressing was applied. The patient tolerated the procedure well without immediate post procedural complication. FINDINGS: A total of approximately 3.8 L of serous fluid was removed. IMPRESSION: Successful ultrasound-guided paracentesis yielding 3.8 liters of peritoneal fluid. Read by: Saverio Danker, PA-C Electronically Signed   By: Markus Daft M.D.   On: 12/25/2016 09:30    Scheduled Meds: . albuterol  2.5 mg Nebulization Q6H  . budesonide (PULMICORT) nebulizer solution  0.5 mg Nebulization BID  . furosemide  20 mg Intravenous BID  . nicotine  7 mg Transdermal Daily  . polyethylene glycol  17 g Oral Daily  . predniSONE  50 mg Oral Q breakfast  . spironolactone  25 mg Oral BID  . tamsulosin  0.4 mg Oral Daily   Continuous Infusions: . cefTRIAXone (ROCEPHIN)  IV Stopped (12/25/16 1544)    Assessment/Plan:  1. Massive ascites with history of cirrhosis of the liver and hepatitis C. The patient has had 2 paracentesis removing 8.6 L. Continue IV Lasix if able to get IV. Continue spironolactone. Empiric antibiotics for a high white blood cell count in the paracentesis fluid. 2. COPD exacerbation. Continue prednisone and nebulizer treatments. Added budesonide nebulizers yesterday. 3. Essential hypertension on Lasix and spironolactone 4. 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 Constipation. Start MiraLAX. Patient states this has helped him. 7. Difficult getting his urine out. Start Flomax  Code Status:     Code Status Orders        Start     Ordered   12/24/16 1338  Full code  Continuous     12/24/16 1337    Code Status History    Date Active Date Inactive Code  Status Order ID Comments User Context   This patient has a current code status but no historical code status.     Disposition Plan: Hopefully home will be able to get out of the hospital next day or so  Antibiotics:  Rocephin  Time spent: 25 minutes  [place], [place]

## 2016-12-27 LAB — COMPREHENSIVE METABOLIC PANEL
ALBUMIN: 3.2 g/dL — AB (ref 3.5–5.0)
ALK PHOS: 86 U/L (ref 38–126)
ALT: 42 U/L (ref 17–63)
ANION GAP: 6 (ref 5–15)
AST: 56 U/L — AB (ref 15–41)
BILIRUBIN TOTAL: 0.9 mg/dL (ref 0.3–1.2)
BUN: 9 mg/dL (ref 6–20)
CO2: 26 mmol/L (ref 22–32)
CREATININE: 0.68 mg/dL (ref 0.61–1.24)
Calcium: 9 mg/dL (ref 8.9–10.3)
Chloride: 99 mmol/L — ABNORMAL LOW (ref 101–111)
GFR calc Af Amer: >60 mL/min (ref >60)
GFR calc non Af Amer: >60 mL/min (ref >60)
GLUCOSE: 100 mg/dL — AB (ref 65–99)
Potassium: 4 mmol/L (ref 3.5–5.1)
SODIUM: 131 mmol/L — AB (ref 135–145)
TOTAL PROTEIN: 7.1 g/dL (ref 6.5–8.1)

## 2016-12-27 MED ORDER — CIPROFLOXACIN HCL 500 MG PO TABS: 500.0000 mg | ORAL_TABLET | Freq: Every day | ORAL | 0 refills | Status: DC

## 2016-12-27 MED ORDER — ALBUTEROL SULFATE HFA 108 (90 BASE) MCG/ACT IN AERS: 2.0000 | INHALATION_SPRAY | RESPIRATORY_TRACT | 0 refills | Status: DC | PRN

## 2016-12-27 MED ORDER — ALBUTEROL SULFATE (2.5 MG/3ML) 0.083% IN NEBU: 2.5000 mg | INHALATION_SOLUTION | RESPIRATORY_TRACT | 0 refills | Status: AC | PRN

## 2016-12-27 MED ORDER — NICOTINE 7 MG/24HR TD PT24: 7.0000 mg | MEDICATED_PATCH | Freq: Every day | TRANSDERMAL | 0 refills | Status: DC

## 2016-12-27 MED ORDER — POLYETHYLENE GLYCOL 3350 17 G PO PACK
17.0000 g | PACK | Freq: Every day | ORAL | 0 refills | Status: DC | PRN
Start: 2016-12-27 — End: 2017-12-06

## 2016-12-27 MED ORDER — TIOTROPIUM BROMIDE MONOHYDRATE 18 MCG IN CAPS: 18.0000 ug | ORAL_CAPSULE | Freq: Every day | RESPIRATORY_TRACT | 0 refills | Status: DC

## 2016-12-27 MED ORDER — SPIRONOLACTONE 25 MG PO TABS: 25.0000 mg | ORAL_TABLET | Freq: Two times a day (BID) | ORAL | 0 refills | Status: DC

## 2016-12-27 MED ORDER — TRAMADOL HCL 50 MG PO TABS: 50.0000 mg | ORAL_TABLET | Freq: Four times a day (QID) | ORAL | 0 refills | Status: DC | PRN

## 2016-12-27 MED ORDER — BISACODYL 10 MG RE SUPP: 10.0000 mg | Freq: Every day | RECTAL | Status: DC | PRN

## 2016-12-27 MED ORDER — PREDNISONE 10 MG PO TABS: ORAL_TABLET | ORAL | 0 refills | Status: DC

## 2016-12-27 MED ORDER — FUROSEMIDE 20 MG PO TABS
20.0000 mg | ORAL_TABLET | Freq: Two times a day (BID) | ORAL | 0 refills | Status: DC
Start: 2016-12-27 — End: 2017-01-05

## 2016-12-27 MED ORDER — FLUTICASONE-SALMETEROL 250-50 MCG/DOSE IN AEPB: 1.0000 | INHALATION_SPRAY | Freq: Two times a day (BID) | RESPIRATORY_TRACT | 0 refills | Status: DC

## 2016-12-27 MED ORDER — CLONAZEPAM 0.5 MG PO TABS: 0.5000 mg | ORAL_TABLET | Freq: Two times a day (BID) | ORAL | 0 refills | Status: DC | PRN

## 2016-12-27 MED ORDER — LACTULOSE 10 GM/15ML PO SOLN: 30.0000 g | Freq: Every day | ORAL | Status: DC | PRN

## 2016-12-27 NOTE — Discharge Summary (Signed)
[facility] [place] NAME: Bradley Dunlap    MR#:  [id number]  DATE OF BIRTH:  [birth year]  DATE OF ADMISSION:  12/24/2016 ADMITTING PHYSICIAN: Fritzi Mandes, MD  DATE OF DISCHARGE: 12/27/2016  PRIMARY CARE PHYSICIAN: Doctor on his medicaid card    ADMISSION DIAGNOSIS:  Generalized abdominal pain [R10.84] COPD exacerbation ([place]) [J44.1] Abdominal pain [R10.9] Ascites due to alcoholic cirrhosis ([place]) [A35.57]  DISCHARGE DIAGNOSIS:  Active Problems:   Ascites   SECONDARY DIAGNOSIS:   Past Medical History:  Diagnosis Date  . COPD (chronic obstructive pulmonary disease) ([place])   . Hepatitis C   . Hypertension     HOSPITAL COURSE:   1. Massive ascites with history of cirrhosis of the liver and hepatitis C. During the hospital course he had 2 paracentesis removing a total of 8.6 L.  The patient was diuresed with IV Lasix during the hospital course. Patient be switched over to oral Lasix 20 mg twice daily upon discharge home. Patient was started on spironolactone and increased to twice a day dosing on that. Continue titrating the spironolactone as outpatient.  SBP prophylaxis with Cipro. Patient will likely need paracentesis every few weeks. He will need a prescription for this from either his medical doctor or gastroenterologist. I did give him the number for interventional radiology. 2. COPD exacerbation. Continue prednisone and nebulizer treatments. Scripts written for albuterol inhaler, Spiriva and Advair. Also 2 more days of prednisone. 3. Essential hypertension on Lasix and spironolactone 4. Hyperbilirubinemia secondary liver disease 5. Thrombocytopenia secondary liver disease 6. Constipation. MiraLAX at home. In the hospital use Fleet enema suppository lactulose and MiraLAX.  DISCHARGE CONDITIONS:   satisfactory  CONSULTS OBTAINED:  none  DRUG ALLERGIES:  No Known Allergies  DISCHARGE MEDICATIONS:   Current Discharge Medication  List    START taking these medications   Details  ciprofloxacin (CIPRO) 500 MG tablet Take 1 tablet (500 mg total) by mouth daily with breakfast. Qty: 30 tablet, Refills: 0    furosemide (LASIX) 20 MG tablet Take 1 tablet (20 mg total) by mouth 2 (two) times daily. Qty: 60 tablet, Refills: 0    nicotine (NICODERM CQ - DOSED IN MG/24 HR) 7 mg/24hr patch Place 1 patch (7 mg total) onto the skin daily. Qty: 28 patch, Refills: 0    polyethylene glycol (MIRALAX / GLYCOLAX) packet Take 17 g by mouth daily as needed. Qty: 30 each, Refills: 0    predniSONE (DELTASONE) 10 MG tablet 4 tablets daily for 2 more days Qty: 8 tablet, Refills: 0    spironolactone (ALDACTONE) 25 MG tablet Take 1 tablet (25 mg total) by mouth 2 (two) times daily. Qty: 60 tablet, Refills: 0    tiotropium (SPIRIVA HANDIHALER) 18 MCG inhalation capsule Place 1 capsule (18 mcg total) into inhaler and inhale daily. Qty: 30 capsule, Refills: 0    traMADol (ULTRAM) 50 MG tablet Take 1 tablet (50 mg total) by mouth every 6 (six) hours as needed for moderate pain. Qty: 20 tablet, Refills: 0      CONTINUE these medications which have CHANGED   Details  albuterol (PROVENTIL HFA;VENTOLIN HFA) 108 (90 Base) MCG/ACT inhaler Inhale 2 puffs into the lungs every 4 (four) hours as needed for wheezing. Qty: 1 Inhaler, Refills: 0    albuterol (PROVENTIL) (2.5 MG/3ML) 0.083% nebulizer solution Take 3 mLs (2.5 mg total) by nebulization every 4 (four) hours as needed for wheezing. Qty: 100 vial, Refills: 0    clonazePAM (KLONOPIN)  0.5 MG tablet Take 1 tablet (0.5 mg total) by mouth 2 (two) times daily as needed for anxiety. Qty: 10 tablet, Refills: 0    Fluticasone-Salmeterol (ADVAIR DISKUS) 250-50 MCG/DOSE AEPB Inhale 1 puff into the lungs 2 (two) times daily. Qty: 31 each, Refills: 0         DISCHARGE INSTRUCTIONS:   Follow-up PMD one week Follow-up [facility] gastroenterology   If you experience worsening of  your admission symptoms, develop shortness of breath, life threatening emergency, suicidal or homicidal thoughts you must seek medical attention immediately by calling 911 or calling your MD immediately  if symptoms less severe.  You Must read complete instructions/literature along with all the possible adverse reactions/side effects for all the Medicines you take and that have been prescribed to you. Take any new Medicines after you have completely understood and accept all the possible adverse reactions/side effects.   Please note  You were cared for by a hospitalist during your hospital stay. If you have any questions about your discharge medications or the care you received while you were in the hospital after you are discharged, you can call the unit and asked to speak with the hospitalist on call if the hospitalist that took care of you is not available. Once you are discharged, your primary care physician will handle any further medical issues. Please note that NO REFILLS for any discharge medications will be authorized once you are discharged, as it is imperative that you return to your primary care physician (or establish a relationship with a primary care physician if you do not have one) for your aftercare needs so that they can reassess your need for medications and monitor your lab values.    Today   CHIEF COMPLAINT:   Chief Complaint  Patient presents with  . Abdominal Pain  . Shortness of Breath    HISTORY OF PRESENT ILLNESS:  Bradley Dunlap  is a 61 y.o. male with a known history of Cirrhosis presents with abdominal pain and shortness of breath   VITAL SIGNS:  Blood pressure 120/62, pulse 84, temperature 97.8 F (36.6 C), temperature source Oral, resp. rate 20, height 5\' 8"  (1.727 m), weight 104.3 kg (230 lb), SpO2 93 %.    PHYSICAL EXAMINATION:  GENERAL:  61 y.o.-year-old patient lying in the bed with no acute distress.  EYES: Pupils equal, round, reactive to light and  accommodation. No scleral icterus. Extraocular muscles intact.  HEENT: Head atraumatic, normocephalic. Oropharynx and nasopharynx clear.  NECK:  Supple, no jugular venous distention. No thyroid enlargement, no tenderness.  LUNGS:  betterbreath sounds bilaterally, slight expiratory wheezing. norales,rhonchi or crepitation. No use of accessory muscles of respiration.  CARDIOVASCULAR: S1, S2 normal. No murmurs, rubs, or gallops.  ABDOMEN: Soft, non-tender, distended. Bowel sounds present. No organomegaly or mass.  EXTREMITIES: No pedal edema, cyanosis, or clubbing.  NEUROLOGIC: Cranial nerves II through XII are intact. Muscle strength 5/5 in all extremities. Sensation intact. Gait not checked.  PSYCHIATRIC: The patient is alert and oriented x 3.  SKIN: No obvious rash, lesion, or ulcer.   DATA REVIEW:   CBC  Recent Labs Lab 12/24/16 0825  WBC 7.6  HGB 15.3  HCT 43.7  PLT 123*    Chemistries   Recent Labs Lab 12/27/16 0420  NA 131*  K 4.0  CL 99*  CO2 26  GLUCOSE 100*  BUN 9  CREATININE 0.68  CALCIUM 9.0  AST 56*  ALT 42  ALKPHOS 86  BILITOT 0.9  Cardiac Enzymes  Recent Labs Lab 12/24/16 0854  TROPONINI <0.03    Microbiology Results  Results for orders placed or performed during the hospital encounter of 12/24/16  Body fluid culture     Status: None (Preliminary result)   Collection Time: 12/24/16 10:18 AM  Result Value Ref Range Status   Specimen Description PERITONEAL  Final   Special Requests NONE  Final   Gram Stain   Final    ABUNDANT WBC PRESENT, PREDOMINANTLY MONONUCLEAR NO ORGANISMS SEEN    Culture   Final    NO GROWTH 3 DAYS Performed at [facility], [place] [address], [place], [place] [address]    Report Status PENDING  Incomplete     Management plans discussed with the patient, and he is in agreement.  CODE STATUS:     Code Status Orders        Start     Ordered   12/24/16 1338  Full code  Continuous     12/24/16 1337     Code Status History    Date Active Date Inactive Code Status Order ID Comments User Context   This patient has a current code status but no historical code status.      TOTAL TIME TAKING CARE OF THIS PATIENT: 35 minutes.    Loletha Grayer M.D on 12/27/2016 at 1:58 PM  Between 7am to 6pm - Pager - [phone]  After 6pm go to [email or web address] - password [email or web address] [facility]  [facility] Office  [phone]  CC: Primary care physician; Patient, No Pcp Per

## 2016-12-27 NOTE — Care Management Note (Signed)
Case Management Note  Patient Details  Name: Bradley Dunlap MRN: [id number] Date of Birth: [birth year]  Subjective/Objective:     Levada Dy, RN to give [name] his prescriptions to take to the pharmacy of his choice today because his regular pharmacy, [facility], is closed on Sunday.                Action/Plan:   Expected Discharge Date:  12/27/16               Expected Discharge Plan:     In-House Referral:     Discharge planning Services     Post Acute Care Choice:    Choice offered to:     DME Arranged:    DME Agency:     HH Arranged:    HH Agency:     Status of Service:     If discussed at [employer], dates discussed:    Additional Comments:  Tali Cleaves A, RN 12/27/2016, 10:42 AM

## 2016-12-27 NOTE — Discharge Instructions (Signed)
Ascites °Ascites is a collection of excess fluid in the abdomen. Ascites can range from mild to severe. It can get worse without treatment. °What are the causes? °Possible causes include: °· Cirrhosis. This is the most common cause of ascites. °· Infection or inflammation in the abdomen. °· Cancer in the abdomen. °· Heart failure. °· Kidney disease. °· Inflammation of the pancreas. °· Clots in the veins of the liver. ° °What are the signs or symptoms? °Signs and symptoms may include: °· A feeling of fullness in your abdomen. This is common. °· An increase in the size of your abdomen or your waist. °· Swelling in your legs. °· Swelling of the scrotum in men. °· Difficulty breathing. °· Abdominal pain. °· Sudden weight gain. ° °If the condition is mild, you may not have symptoms. °How is this diagnosed? °To make a diagnosis, your health care provider will: °· Ask about your medical history. °· Perform a physical exam. °· Order imaging tests, such as an ultrasound or CT scan of your abdomen. ° °How is this treated? °Treatment depends on the cause of the ascites. It may include: °· Taking a pill to make you urinate. This is called a water pill (diuretic pill). °· Strictly reducing your salt (sodium) intake. Salt can cause extra fluid to be kept in the body, and this makes ascites worse. °· Having a procedure to remove fluid from your abdomen (paracentesis). °· Having a procedure to transfer fluid from your abdomen into a vein. °· Having a procedure that connects two of the major veins within your liver and relieves pressure on your liver (TIPS procedure). ° °Ascites may go away or improve with treatment of the condition that caused it. °Follow these instructions at home: °· Keep track of your weight. To do this, weigh yourself at the same time every day and record your weight. °· Keep track of how much you drink and any changes in the amount you urinate. °· Follow any instructions that your health care provider gives  you about how much to drink. °· Try not to eat salty (high-sodium) foods. °· Take medicines only as directed by your health care provider. °· Keep all follow-up visits as directed by your health care provider. This is important. °· Report any changes in your health to your health care provider, especially if you develop new symptoms or your symptoms get worse. °Contact a health care provider if: °· Your gain more than 3 pounds in 3 days. °· Your abdominal size or your waist size increases. °· You have new swelling in your legs. °· The swelling in your legs gets worse. °Get help right away if: °· You develop a fever. °· You develop confusion. °· You develop new or worsening difficulty breathing. °· You develop new or worsening abdominal pain. °· You develop new or worsening swelling in the scrotum (in men). °This information is not intended to replace advice given to you by your health care provider. Make sure you discuss any questions you have with your health care provider. °Document Released: 04/13/2005 Document Revised: 08/21/2015 Document Reviewed: 11/10/2013 °Elsevier Interactive Patient Education © 2018 [employer] ° °

## 2016-12-27 NOTE — Progress Notes (Signed)
Patient IV removed. Discharged instructions giving to patient and patient verbalizes understanding of discharge instructions. Discharged via wheelchair by staff. Family waiting at the visitors entrance.

## 2016-12-28 LAB — BODY FLUID CULTURE: CULTURE: NO GROWTH

## 2016-12-30 ENCOUNTER — Other Ambulatory Visit: Payer: Self-pay | Admitting: Gastroenterology

## 2016-12-30 ENCOUNTER — Other Ambulatory Visit
Admission: RE | Admit: 2016-12-30 | Discharge: 2016-12-30 | Disposition: A | Discharge status: Home / Self Care | Payer: Medicaid Other | Source: Ambulatory Visit | Attending: Gastroenterology | Admitting: Gastroenterology

## 2016-12-30 ENCOUNTER — Ambulatory Visit
Admission: RE | Admit: 2016-12-30 | Discharge: 2016-12-30 | Disposition: A | Discharge status: Home / Self Care | Payer: Medicaid Other | Source: Ambulatory Visit | Attending: Gastroenterology | Admitting: Gastroenterology

## 2016-12-30 DIAGNOSIS — K7031 Alcoholic cirrhosis of liver with ascites: Secondary | ICD-10-CM

## 2016-12-30 DIAGNOSIS — R1084 Generalized abdominal pain: Secondary | ICD-10-CM

## 2016-12-30 DIAGNOSIS — R109 Unspecified abdominal pain: Secondary | ICD-10-CM | POA: Diagnosis not present

## 2016-12-30 LAB — CBC WITH DIFFERENTIAL/PLATELET
BASOS PCT: 0 %
Basophils Absolute: 0 10*3/uL (ref 0–0.1)
EOS ABS: 0.6 10*3/uL (ref 0–0.7)
Eosinophils Relative: 4 %
HCT: 43.1 % (ref 40.0–52.0)
HEMOGLOBIN: 15.3 g/dL (ref 13.0–18.0)
Lymphocytes Relative: 15 %
Lymphs Abs: 2.1 10*3/uL (ref 1.0–3.6)
MCH: 33.4 pg (ref 26.0–34.0)
MCHC: 35.4 g/dL (ref 32.0–36.0)
MCV: 94.3 fL (ref 80.0–100.0)
MONO ABS: 1.5 10*3/uL — AB (ref 0.2–1.0)
MONOS PCT: 11 %
NEUTROS PCT: 70 %
Neutro Abs: 9.9 10*3/uL — ABNORMAL HIGH (ref 1.4–6.5)
Platelets: 142 10*3/uL — ABNORMAL LOW (ref 150–440)
RBC: 4.58 MIL/uL (ref 4.40–5.90)
RDW: 14.6 % — AB (ref 11.5–14.5)
WBC: 14.1 10*3/uL — ABNORMAL HIGH (ref 3.8–10.6)

## 2016-12-30 LAB — BASIC METABOLIC PANEL
Anion gap: 8 (ref 5–15)
BUN: 11 mg/dL (ref 6–20)
CALCIUM: 9.3 mg/dL (ref 8.9–10.3)
CO2: 24 mmol/L (ref 22–32)
CREATININE: 0.78 mg/dL (ref 0.61–1.24)
Chloride: 96 mmol/L — ABNORMAL LOW (ref 101–111)
Glucose, Bld: 101 mg/dL — ABNORMAL HIGH (ref 65–99)
Potassium: 3.7 mmol/L (ref 3.5–5.1)
Sodium: 128 mmol/L — ABNORMAL LOW (ref 135–145)

## 2016-12-30 LAB — ALBUMIN, PLEURAL OR PERITONEAL FLUID: Albumin, Fluid: <1 g/dL

## 2016-12-30 LAB — HEPATIC FUNCTION PANEL
ALBUMIN: 3.8 g/dL (ref 3.5–5.0)
ALK PHOS: 106 U/L (ref 38–126)
ALT: 63 U/L (ref 17–63)
AST: 75 U/L — ABNORMAL HIGH (ref 15–41)
Bilirubin, Direct: 0.3 mg/dL (ref 0.1–0.5)
Indirect Bilirubin: 0.9 mg/dL (ref 0.3–0.9)
Total Bilirubin: 1.2 mg/dL (ref 0.3–1.2)
Total Protein: 8 g/dL (ref 6.5–8.1)

## 2016-12-30 LAB — PROTIME-INR
INR: 1.21
PROTHROMBIN TIME: 15.2 s (ref 11.4–15.2)

## 2016-12-30 LAB — BODY FLUID CELL COUNT WITH DIFFERENTIAL
Eos, Fluid: 0 %
LYMPHS FL: 31 %
MONOCYTE-MACROPHAGE-SEROUS FLUID: 40 %
NEUTROPHIL FLUID: 29 %
Other Cells, Fluid: 0 %
WBC FLUID: 110 uL

## 2016-12-30 LAB — PROTEIN, PLEURAL OR PERITONEAL FLUID: Total protein, fluid: <3 g/dL

## 2016-12-30 LAB — APTT: aPTT: 25 seconds (ref 24–36)

## 2016-12-31 ENCOUNTER — Other Ambulatory Visit: Payer: Self-pay | Admitting: Gastroenterology

## 2016-12-31 DIAGNOSIS — R1084 Generalized abdominal pain: Secondary | ICD-10-CM

## 2016-12-31 LAB — MISC LABCORP TEST (SEND OUT): LABCORP TEST CODE: 19588

## 2017-01-01 LAB — CYTOLOGY - NON PAP

## 2017-01-02 LAB — BODY FLUID CULTURE: Culture: NO GROWTH

## 2017-01-03 ENCOUNTER — Inpatient Hospital Stay
Admission: EM | Admit: 2017-01-03 | Discharge: 2017-01-05 | DRG: 432 | Disposition: A | Discharge status: Home Health | Payer: Medicaid Other | Attending: Internal Medicine | Admitting: Internal Medicine

## 2017-01-03 ENCOUNTER — Encounter: Payer: Self-pay | Admitting: Anesthesiology

## 2017-01-03 ENCOUNTER — Encounter
Admission: EM | Disposition: A | Discharge status: Home Health | Payer: Self-pay | Source: Home / Self Care | Attending: Internal Medicine

## 2017-01-03 DIAGNOSIS — D72829 Elevated white blood cell count, unspecified: Secondary | ICD-10-CM | POA: Diagnosis present

## 2017-01-03 DIAGNOSIS — R140 Abdominal distension (gaseous): Secondary | ICD-10-CM | POA: Diagnosis present

## 2017-01-03 DIAGNOSIS — K7031 Alcoholic cirrhosis of liver with ascites: Principal | ICD-10-CM

## 2017-01-03 DIAGNOSIS — K921 Melena: Secondary | ICD-10-CM | POA: Diagnosis present

## 2017-01-03 DIAGNOSIS — B182 Chronic viral hepatitis C: Secondary | ICD-10-CM | POA: Diagnosis present

## 2017-01-03 DIAGNOSIS — E871 Hypo-osmolality and hyponatremia: Secondary | ICD-10-CM | POA: Diagnosis present

## 2017-01-03 DIAGNOSIS — Z7982 Long term (current) use of aspirin: Secondary | ICD-10-CM | POA: Diagnosis not present

## 2017-01-03 DIAGNOSIS — R188 Other ascites: Secondary | ICD-10-CM | POA: Diagnosis not present

## 2017-01-03 DIAGNOSIS — J449 Chronic obstructive pulmonary disease, unspecified: Secondary | ICD-10-CM | POA: Diagnosis present

## 2017-01-03 DIAGNOSIS — C229 Malignant neoplasm of liver, not specified as primary or secondary: Secondary | ICD-10-CM | POA: Diagnosis present

## 2017-01-03 DIAGNOSIS — Z79899 Other long term (current) drug therapy: Secondary | ICD-10-CM | POA: Diagnosis not present

## 2017-01-03 DIAGNOSIS — I8511 Secondary esophageal varices with bleeding: Secondary | ICD-10-CM | POA: Diagnosis present

## 2017-01-03 DIAGNOSIS — K920 Hematemesis: Secondary | ICD-10-CM | POA: Diagnosis present

## 2017-01-03 DIAGNOSIS — Z6833 Body mass index (BMI) 33.0-33.9, adult: Secondary | ICD-10-CM | POA: Diagnosis not present

## 2017-01-03 DIAGNOSIS — C220 Liver cell carcinoma: Secondary | ICD-10-CM | POA: Diagnosis not present

## 2017-01-03 DIAGNOSIS — K766 Portal hypertension: Secondary | ICD-10-CM | POA: Diagnosis present

## 2017-01-03 DIAGNOSIS — Z7952 Long term (current) use of systemic steroids: Secondary | ICD-10-CM

## 2017-01-03 DIAGNOSIS — I10 Essential (primary) hypertension: Secondary | ICD-10-CM | POA: Diagnosis present

## 2017-01-03 DIAGNOSIS — I8500 Esophageal varices without bleeding: Secondary | ICD-10-CM

## 2017-01-03 DIAGNOSIS — K3189 Other diseases of stomach and duodenum: Secondary | ICD-10-CM | POA: Diagnosis present

## 2017-01-03 DIAGNOSIS — F17210 Nicotine dependence, cigarettes, uncomplicated: Secondary | ICD-10-CM | POA: Diagnosis present

## 2017-01-03 DIAGNOSIS — I8501 Esophageal varices with bleeding: Secondary | ICD-10-CM | POA: Diagnosis not present

## 2017-01-03 DIAGNOSIS — Z7951 Long term (current) use of inhaled steroids: Secondary | ICD-10-CM | POA: Diagnosis not present

## 2017-01-03 DIAGNOSIS — F419 Anxiety disorder, unspecified: Secondary | ICD-10-CM | POA: Diagnosis present

## 2017-01-03 HISTORY — DX: Alcohol use, unspecified, uncomplicated: F10.90

## 2017-01-03 HISTORY — DX: Other specified health status: Z78.9

## 2017-01-03 HISTORY — DX: Other problems related to lifestyle: Z72.89

## 2017-01-03 HISTORY — DX: Tobacco use: Z72.0

## 2017-01-03 HISTORY — DX: Other ascites: R18.8

## 2017-01-03 HISTORY — DX: Unspecified cirrhosis of liver: K74.60

## 2017-01-03 LAB — COMPREHENSIVE METABOLIC PANEL
ALT: 51 U/L (ref 17–63)
AST: 59 U/L — AB (ref 15–41)
Albumin: 3.6 g/dL (ref 3.5–5.0)
Alkaline Phosphatase: 115 U/L (ref 38–126)
Anion gap: 10 (ref 5–15)
BUN: 17 mg/dL (ref 6–20)
CHLORIDE: 92 mmol/L — AB (ref 101–111)
CO2: 26 mmol/L (ref 22–32)
CREATININE: 0.95 mg/dL (ref 0.61–1.24)
Calcium: 9 mg/dL (ref 8.9–10.3)
Glucose, Bld: 118 mg/dL — ABNORMAL HIGH (ref 65–99)
POTASSIUM: 3.8 mmol/L (ref 3.5–5.1)
SODIUM: 128 mmol/L — AB (ref 135–145)
Total Bilirubin: 1.7 mg/dL — ABNORMAL HIGH (ref 0.3–1.2)
Total Protein: 8 g/dL (ref 6.5–8.1)

## 2017-01-03 LAB — TYPE AND SCREEN
ABO/RH(D): O NEG
Antibody Screen: NEGATIVE

## 2017-01-03 LAB — CBC
HEMATOCRIT: 41.2 % (ref 40.0–52.0)
Hemoglobin: 14.4 g/dL (ref 13.0–18.0)
MCH: 32.6 pg (ref 26.0–34.0)
MCHC: 34.9 g/dL (ref 32.0–36.0)
MCV: 93.4 fL (ref 80.0–100.0)
Platelets: 153 10*3/uL (ref 150–440)
RBC: 4.42 MIL/uL (ref 4.40–5.90)
RDW: 14 % (ref 11.5–14.5)
WBC: 13.9 10*3/uL — AB (ref 3.8–10.6)

## 2017-01-03 LAB — PROTIME-INR
INR: 1.13
PROTHROMBIN TIME: 14.4 s (ref 11.4–15.2)

## 2017-01-03 LAB — HEMOGLOBIN: Hemoglobin: 13.1 g/dL (ref 13.0–18.0)

## 2017-01-03 SURGERY — EGD (ESOPHAGOGASTRODUODENOSCOPY)
Anesthesia: General

## 2017-01-03 MED ORDER — SODIUM CHLORIDE 0.9 % IV SOLN
8.0000 mg/h | INTRAVENOUS | Status: DC
  Administered 2017-01-03 – 2017-01-05 (×4): 8 mg/h via INTRAVENOUS
  Filled 2017-01-03 (×4): qty 80

## 2017-01-03 MED ORDER — SODIUM CHLORIDE 0.9 % IV BOLUS (SEPSIS)
500.0000 mL | Freq: Once | INTRAVENOUS | Status: AC
  Administered 2017-01-03: 500 mL via INTRAVENOUS

## 2017-01-03 MED ORDER — ONDANSETRON HCL 4 MG/2ML IJ SOLN
4.0000 mg | Freq: Four times a day (QID) | INTRAMUSCULAR | Status: DC | PRN
  Administered 2017-01-05: 4 mg via INTRAVENOUS
  Filled 2017-01-03: qty 2

## 2017-01-03 MED ORDER — TRAMADOL HCL 50 MG PO TABS
50.0000 mg | ORAL_TABLET | Freq: Four times a day (QID) | ORAL | Status: DC | PRN
  Administered 2017-01-03 – 2017-01-05 (×5): 50 mg via ORAL
  Filled 2017-01-03 (×5): qty 1

## 2017-01-03 MED ORDER — SODIUM CHLORIDE 0.9 % IV SOLN
80.0000 mg | Freq: Once | INTRAVENOUS | Status: AC
  Administered 2017-01-03: 80 mg via INTRAVENOUS
  Filled 2017-01-03: qty 80

## 2017-01-03 MED ORDER — ACETAMINOPHEN 325 MG PO TABS
650.0000 mg | ORAL_TABLET | Freq: Four times a day (QID) | ORAL | Status: DC | PRN
  Administered 2017-01-05: 650 mg via ORAL
  Filled 2017-01-03: qty 2

## 2017-01-03 MED ORDER — SODIUM CHLORIDE 0.9 % IV SOLN
50.0000 ug/h | INTRAVENOUS | Status: DC
  Administered 2017-01-03 – 2017-01-05 (×4): 50 ug/h via INTRAVENOUS
  Filled 2017-01-03 (×9): qty 1

## 2017-01-03 MED ORDER — NICOTINE 14 MG/24HR TD PT24
14.0000 mg | MEDICATED_PATCH | Freq: Every day | TRANSDERMAL | Status: DC
  Administered 2017-01-03 – 2017-01-05 (×3): 14 mg via TRANSDERMAL
  Filled 2017-01-03 (×3): qty 1

## 2017-01-03 MED ORDER — ONDANSETRON HCL 4 MG PO TABS
4.0000 mg | ORAL_TABLET | Freq: Four times a day (QID) | ORAL | Status: DC | PRN
  Administered 2017-01-05: 4 mg via ORAL
  Filled 2017-01-03: qty 1

## 2017-01-03 MED ORDER — DEXTROSE 5 % IV SOLN
2.0000 g | INTRAVENOUS | Status: DC
  Administered 2017-01-04: 2 g via INTRAVENOUS
  Filled 2017-01-03 (×2): qty 2

## 2017-01-03 MED ORDER — ONDANSETRON HCL 4 MG/2ML IJ SOLN
4.0000 mg | Freq: Once | INTRAMUSCULAR | Status: AC
  Administered 2017-01-03: 4 mg via INTRAVENOUS
  Filled 2017-01-03: qty 2

## 2017-01-03 MED ORDER — SPIRONOLACTONE 25 MG PO TABS
100.0000 mg | ORAL_TABLET | Freq: Every day | ORAL | Status: DC
  Administered 2017-01-03 – 2017-01-04 (×2): 100 mg via ORAL
  Filled 2017-01-03 (×2): qty 4

## 2017-01-03 MED ORDER — MOMETASONE FURO-FORMOTEROL FUM 200-5 MCG/ACT IN AERO
2.0000 | INHALATION_SPRAY | Freq: Two times a day (BID) | RESPIRATORY_TRACT | Status: DC
  Administered 2017-01-03 – 2017-01-05 (×4): 2 via RESPIRATORY_TRACT
  Filled 2017-01-03: qty 8.8

## 2017-01-03 MED ORDER — IPRATROPIUM-ALBUTEROL 0.5-2.5 (3) MG/3ML IN SOLN
3.0000 mL | Freq: Four times a day (QID) | RESPIRATORY_TRACT | Status: DC
  Administered 2017-01-03 – 2017-01-05 (×7): 3 mL via RESPIRATORY_TRACT
  Filled 2017-01-03 (×7): qty 3

## 2017-01-03 MED ORDER — DEXTROSE 5 % IV SOLN
1.0000 g | Freq: Once | INTRAVENOUS | Status: DC
  Filled 2017-01-03 (×2): qty 10

## 2017-01-03 MED ORDER — OCTREOTIDE LOAD VIA INFUSION
50.0000 ug | Freq: Once | INTRAVENOUS | Status: AC
  Administered 2017-01-03: 50 ug via INTRAVENOUS
  Filled 2017-01-03: qty 25

## 2017-01-03 MED ORDER — CLONAZEPAM 0.5 MG PO TABS
0.5000 mg | ORAL_TABLET | Freq: Two times a day (BID) | ORAL | Status: DC
  Administered 2017-01-03 – 2017-01-05 (×4): 0.5 mg via ORAL
  Filled 2017-01-03 (×4): qty 1

## 2017-01-03 MED ORDER — ACETAMINOPHEN 650 MG RE SUPP: 650.0000 mg | Freq: Four times a day (QID) | RECTAL | Status: DC | PRN

## 2017-01-03 MED ORDER — FUROSEMIDE 10 MG/ML IJ SOLN
40.0000 mg | Freq: Every day | INTRAMUSCULAR | Status: DC
  Administered 2017-01-03 – 2017-01-04 (×2): 40 mg via INTRAVENOUS
  Filled 2017-01-03 (×2): qty 4

## 2017-01-03 MED ORDER — ALBUMIN HUMAN 25 % IV SOLN
12.5000 g | Freq: Once | INTRAVENOUS | Status: DC
  Filled 2017-01-03: qty 50

## 2017-01-03 MED ORDER — DEXTROSE 5 % IV SOLN
1.0000 g | Freq: Once | INTRAVENOUS | Status: AC
  Administered 2017-01-03: 1 g via INTRAVENOUS
  Filled 2017-01-03: qty 10

## 2017-01-03 NOTE — ED Provider Notes (Signed)
[facility] Emergency Department Provider Note  ____________________________________________   First MD Initiated Contact with Patient 01/03/17 1253     (approximate)  I have reviewed the triage vital signs and the nursing notes.   HISTORY  Chief Complaint Hematemesis  HPI Bradley Dunlap is a 61 y.o. male here for vomiting blood  Reports he was at the store buying things, suddenly felt nauseated and then vomited a large amount of blood. No shortness of breath. Reports mild discomfort in the upper abdomen. Feels nauseous and though he wants to vomit again. Denies seeing any blood thinners, does take aspirin occasionally. Gets headaches from time to time but reports this is a not unusual for him.  No fevers or chills. Stomach is been swollen, reports this goes on and off and is not having any trouble breathing reports it is swollen but not as much it has been in the past   Past Medical History:  Diagnosis Date  . Alcohol use   . COPD (chronic obstructive pulmonary disease) ([place])   . Hepatitis C   . Hypertension   . Liver cirrhosis ([place])   . Tobacco use     Patient Active Problem List   Diagnosis Date Noted  . Ascites 12/24/2016    Past Surgical History:  Procedure Laterality Date  . BACK SURGERY      Prior to Admission medications   Medication Sig Start Date End Date Taking? Authorizing Provider  lisinopril (PRINIVIL,ZESTRIL) 20 MG tablet Take 20 mg by mouth daily. 08/14/16 08/14/17 Yes [provider]  albuterol (PROVENTIL HFA;VENTOLIN HFA) 108 (90 Base) MCG/ACT inhaler Inhale 2 puffs into the lungs every 4 (four) hours as needed for wheezing. 12/27/16   Loletha Grayer, MD  albuterol (PROVENTIL) (2.5 MG/3ML) 0.083% nebulizer solution Take 3 mLs (2.5 mg total) by nebulization every 4 (four) hours as needed for wheezing. 12/27/16   Loletha Grayer, MD  ciprofloxacin (CIPRO) 500 MG tablet Take 1 tablet (500 mg total) by mouth daily with  breakfast. 12/27/16   Loletha Grayer, MD  clonazePAM (KLONOPIN) 0.5 MG tablet Take 1 tablet (0.5 mg total) by mouth 2 (two) times daily as needed for anxiety. 12/27/16   Loletha Grayer, MD  Fluticasone-Salmeterol (ADVAIR DISKUS) 250-50 MCG/DOSE AEPB Inhale 1 puff into the lungs 2 (two) times daily. 12/27/16   Loletha Grayer, MD  furosemide (LASIX) 20 MG tablet Take 1 tablet (20 mg total) by mouth 2 (two) times daily. 12/27/16 12/27/17  Loletha Grayer, MD  nicotine (NICODERM CQ - DOSED IN MG/24 HR) 7 mg/24hr patch Place 1 patch (7 mg total) onto the skin daily. 12/27/16   Loletha Grayer, MD  polyethylene glycol (MIRALAX / GLYCOLAX) packet Take 17 g by mouth daily as needed. 12/27/16   Loletha Grayer, MD  predniSONE (DELTASONE) 10 MG tablet 4 tablets daily for 2 more days 12/27/16   Loletha Grayer, MD  spironolactone (ALDACTONE) 25 MG tablet Take 1 tablet (25 mg total) by mouth 2 (two) times daily. 12/27/16   Loletha Grayer, MD  tiotropium (SPIRIVA HANDIHALER) 18 MCG inhalation capsule Place 1 capsule (18 mcg total) into inhaler and inhale daily. 12/27/16 12/27/17  Loletha Grayer, MD  traMADol (ULTRAM) 50 MG tablet Take 1 tablet (50 mg total) by mouth every 6 (six) hours as needed for moderate pain. 12/27/16   Loletha Grayer, MD    Allergies Patient has no known allergies.  No family history on file.  Social History Social History  Substance Use Topics  .  Smoking status: Current Every Day Smoker    Packs/day: 0.50    Types: Cigarettes  . Smokeless tobacco: Former Systems developer  . Alcohol use 1.8 oz/week    3 Cans of beer per week     Comment: no alcohol in 4 or 5 weeks    Review of Systems Constitutional: No fever/chills Eyes: No visual changes. ENT: No sore throat. Cardiovascular: Denies chest pain. Respiratory: Denies shortness of breath. Gastrointestinal:   No diarrhea.  No constipation. Genitourinary: Negative for dysuria. Musculoskeletal: Negative for back pain. Skin: Negative for  rash. Neurological: Negative for  focal weakness or numbness. Occasional headaches off and on for years. Had one last night, but that is gone now.    ____________________________________________   PHYSICAL EXAM:  VITAL SIGNS: ED Triage Vitals  Enc Vitals Group     BP 01/03/17 1226 132/82     Pulse Rate 01/03/17 1226 (!) 118     Resp 01/03/17 1226 20     Temp 01/03/17 1226 98.6 F (37 C)     Temp Source 01/03/17 1226 Oral     SpO2 01/03/17 1226 98 %     Weight 01/03/17 1227 230 lb (104.3 kg)     Height 01/03/17 1227 5\' 8"  (1.727 m)     Head Circumference --      Peak Flow --      Pain Score 01/03/17 1226 9     Pain Loc --      Pain Edu? --      Excl. in [place]? --     Constitutional: Alert and oriented. Well appearing and in no acute distress. Eyes: Conjunctivae are normal. Head: Atraumatic. Nose: No congestion/rhinnorhea. Mouth/Throat: Mucous membranes are Lightly dry with dried blood noted around the lips and slightly around his. Neck: No stridor.   Cardiovascular: Tachycardic rate, regular rhythm. Grossly normal heart sounds.  Good peripheral circulation. Respiratory: Normal respiratory effort.  No retractions. Lungs CTAB. Gastrointestinal: Soft and nontender. Moderately distended with fluid wave. Denies pain to palpation or evaluation of the abdomen. Musculoskeletal: No lower extremity tenderness nor edema. Neurologic:  Normal speech and language. No gross focal neurologic deficits are appreciated.  Skin:  Skin is warm, dry and intact. No rash noted. Psychiatric: Mood and affect are normal. Speech and behavior are normal.  ____________________________________________   LABS (all labs ordered are listed, but only abnormal results are displayed)  Labs Reviewed  COMPREHENSIVE METABOLIC PANEL - Abnormal; Notable for the following:       Result Value   Sodium 128 (*)    Chloride 92 (*)    Glucose, Bld 118 (*)    AST 59 (*)    Total Bilirubin 1.7 (*)    All other  components within normal limits  CBC - Abnormal; Notable for the following:    WBC 13.9 (*)    All other components within normal limits  PROTIME-INR  POC OCCULT BLOOD, ED  TYPE AND SCREEN   ____________________________________________  EKG  Reviewed injury by me at 1232 Heart rate 1:15 QRS 100 C4 50 Sinus tachycardia, probable incomplete run up from the branch block. No clear evidence of serious disease ____________________________________________  RADIOLOGY   ____________________________________________   PROCEDURES  Procedure(s) performed: None  Procedures  Critical Care performed: No  ____________________________________________   INITIAL IMPRESSION / ASSESSMENT AND PLAN / ED COURSE  Pertinent labs & imaging results that were available during my care of the patient were reviewed by me and considered in my medical decision making (  see chart for details).  Symptoms for evaluation after vomiting blood with nausea. 4 sudden onset of emesis of blood. Describes a large amount of bloody emesis 1. Denies history of same in the past. Has a history of known cirrhosis, unknown if there is serious but does occasionally get ascites  Denies fevers or chills. No chest pain or trouble breathing. Denies taking anticoagulants but does take aspirin occasionally for headaches which she last used yesterday  Discussed case with Dr. Marius Ditch recommends admission with octreotide drip, proton pump inhibitor drip, and ceftriaxone. She will see patient shortly this afternoon. Remain nothing by mouth.  ----------------------------------------- 1:42 PM on 01/03/2017 -----------------------------------------  No ongoing hematemesis. Patient remains stable with mild tachycardia at this time. Based on his presentation, elevated risk for upper GI bleeding and varices patient will be admitted for evaluation with gastroenterology. Appears stable at this time continuing admission orders       ____________________________________________   FINAL CLINICAL IMPRESSION(S) / ED DIAGNOSES  Final diagnoses:  Hematemesis with nausea      NEW MEDICATIONS STARTED DURING THIS VISIT:  New Prescriptions   No medications on file     Note:  This document was prepared using Dragon voice recognition software and may include unintentional dictation errors.     Delman Kitten, MD 01/03/17 1346

## 2017-01-03 NOTE — ED Triage Notes (Signed)
Pt c/o throat pain all night and today having bright red emesis with abd pain. Pt has a hx liver disease and states he was here this week and had to have fluid drawn off his abd.Bradley Dunlap

## 2017-01-03 NOTE — Consult Note (Signed)
[place]by, MD [address]  [address]  [place],  [address]  Main: [phone]  Fax: [phone] Pager: [phone]   Consultation  Referring Provider:     No ref. provider found Primary Care Physician:  Patient, No Pcp Per Primary Gastroenterologist:   Bradley Dunlap     Reason for Consultation:     Hematemesis  Date of Admission:  01/03/2017 Date of Consultation:  01/03/2017         HPI:   Bradley Dunlap is a 61 y.o. male decompensated alcoholic and chronic HCV cirrhosis with recent admission for worsening abdominal distention secondary to ascites, underwent therapeutic paracentesis, 4 L of fluid was removed on 9/5 and was discharged home on furosemide and spironolactone. He also had total of 8.5 L drained out on 12/24/2016 and 12/25/16. He presented to the ER with complaints of throat pain all night and today morning he had vomited bright red blood after he ate some fruit from store. He also reports diffuse abdominal pain. His hemoglobin in the ER was 14.4, dropped from 15.3, 4 days ago. Platelets 153, INR 1.13, LFTs are stable. He has mild leukocytosis. He is being admitted to the floor for further management and GI is consulted for hematemesis. He reports taking OTC aspirin for chronic head aches since several years. He was told that he cant take tylenol. He in not on any blood thinners. He denies f/c. He has dark brown loose stools on miralax.   He was recently seen by the NP in [place] clinic on 12/31/16 who adjusted his diuretics due to hyponatremia. He never had an EGD before and she was planning to arrange endoscopies for variceal screening along with colonoscopy for colon cancer screening during next clinic visit. He has chronic hepatitis C genotype 1A treatment nave.   GI Procedures: None  Past Medical History:  Diagnosis Date  . Alcohol use   . COPD (chronic obstructive pulmonary disease) ([place])   . Hepatitis C   . Hypertension   . Liver  cirrhosis ([place])   . Tobacco use     Past Surgical History:  Procedure Laterality Date  . BACK SURGERY      Prior to Admission medications   Medication Sig Start Date End Date Taking? Authorizing Provider  albuterol (PROVENTIL HFA;VENTOLIN HFA) 108 (90 Base) MCG/ACT inhaler Inhale 2 puffs into the lungs every 4 (four) hours as needed for wheezing. 12/27/16   Loletha Grayer, MD  albuterol (PROVENTIL) (2.5 MG/3ML) 0.083% nebulizer solution Take 3 mLs (2.5 mg total) by nebulization every 4 (four) hours as needed for wheezing. 12/27/16   Loletha Grayer, MD  ciprofloxacin (CIPRO) 500 MG tablet Take 1 tablet (500 mg total) by mouth daily with breakfast. 12/27/16   Loletha Grayer, MD  clonazePAM (KLONOPIN) 0.5 MG tablet Take 1 tablet (0.5 mg total) by mouth 2 (two) times daily as needed for anxiety. 12/27/16   Loletha Grayer, MD  Fluticasone-Salmeterol (ADVAIR DISKUS) 250-50 MCG/DOSE AEPB Inhale 1 puff into the lungs 2 (two) times daily. 12/27/16   Loletha Grayer, MD  furosemide (LASIX) 20 MG tablet Take 1 tablet (20 mg total) by mouth 2 (two) times daily. 12/27/16 12/27/17  Loletha Grayer, MD  nicotine (NICODERM CQ - DOSED IN MG/24 HR) 7 mg/24hr patch Place 1 patch (7 mg total) onto the skin daily. 12/27/16   Loletha Grayer, MD  polyethylene glycol (MIRALAX / GLYCOLAX) packet Take 17 g by mouth daily as needed. 12/27/16   Wieting, Richard,  MD  predniSONE (DELTASONE) 10 MG tablet 4 tablets daily for 2 more days 12/27/16   Loletha Grayer, MD  spironolactone (ALDACTONE) 25 MG tablet Take 1 tablet (25 mg total) by mouth 2 (two) times daily. 12/27/16   Loletha Grayer, MD  tiotropium (SPIRIVA HANDIHALER) 18 MCG inhalation capsule Place 1 capsule (18 mcg total) into inhaler and inhale daily. 12/27/16 12/27/17  Loletha Grayer, MD  traMADol (ULTRAM) 50 MG tablet Take 1 tablet (50 mg total) by mouth every 6 (six) hours as needed for moderate pain. 12/27/16   Loletha Grayer, MD    No family history on file.    Social History  Substance Use Topics  . Smoking status: Current Every Day Smoker    Packs/day: 0.50    Types: Cigarettes  . Smokeless tobacco: Former Systems developer  . Alcohol use 1.8 oz/week    3 Cans of beer per week     Comment: no alcohol in 4 or 5 weeks    Allergies as of 01/03/2017  . (No Known Allergies)    Review of Systems:    All systems reviewed and negative except where noted in HPI.   Physical Exam:  Vital signs in last 24 hours: Temp:  [98.6 F (37 C)] 98.6 F (37 C) (09/09 1226) Pulse Rate:  [109-118] 109 (09/09 1300) Resp:  [19-20] 19 (09/09 1300) BP: (128-132)/(82) 128/82 (09/09 1300) SpO2:  [97 %-98 %] 97 % (09/09 1300) Weight:  [230 lb (104.3 kg)] 230 lb (104.3 kg) (09/09 1227)   General:   Pleasant, cooperative in NAD Head:  Normocephalic and atraumatic. Eyes:   No icterus.   Conjunctiva pink. PERRLA. Ears:  Normal auditory acuity. Neck:  Supple; no masses or thyroidomegaly Lungs: Respirations even and unlabored. Lungs clear to auscultation bilaterally.   No wheezes, crackles, or rhonchi.  Heart:  Regular rate and rhythm;  Without murmur, clicks, rubs or gallops Abdomen:  Soft, severely distended, mild diffuse tenderness. Normal bowel sounds. No appreciable masses or hepatomegaly.  No rebound or guarding.  Rectal:  Not performed. Msk:  Symmetrical without gross deformities.  Strength normal  Extremities:  Without edema, cyanosis or clubbing. Neurologic:  Alert and oriented x3;  grossly normal neurologically. Skin: pruritic lesions on UEs, spider nevi on chest Cervical Nodes:  No significant cervical adenopathy. Psych:  Alert and cooperative. Normal affect.  LAB RESULTS:  Recent Labs  01/03/17 1233  WBC 13.9*  HGB 14.4  HCT 41.2  PLT 153   BMET  Recent Labs  01/03/17 1233  NA 128*  K 3.8  CL 92*  CO2 26  GLUCOSE 118*  BUN 17  CREATININE 0.95  CALCIUM 9.0   LFT  Recent Labs  01/03/17 1233  PROT 8.0  ALBUMIN 3.6  AST 59*  ALT 51   ALKPHOS 115  BILITOT 1.7*   PT/INR  Recent Labs  01/03/17 1233  LABPROT 14.4  INR 1.13    STUDIES: No results found.    Impression / Plan:   Bradley Dunlap is a 61 y.o. 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 His Hb is overall stable. He reports history of daily intake of regular aspirin for chronic HAs  - PPI and octreotide drips - Ceftriaxone for SBP prophylaxis - Therapeutic paracentesis, fluid analysis for cell count and differential, cultures - Administer IV albumin 54mcg bottle during paracetesis - NPO except ice chips - Monitor CBC closely and transfuse  as needed - EGD tomorrow or sooner if he develops recurrence of hematemesis or hemodynamic instability - Lasix 40mg  IV BID, aldactone 150mg  daily - No need of sodium restriction due to hyponatremia - Monitor serum Na, K, Mag closely   Thank you for involving me in the care of this patient. I will follow    LOS: 0 days   Sherri Sear, MD  01/03/2017, 2:42 PM   Note: This dictation was prepared with Dragon dictation along with smaller phrase technology. Any transcriptional errors that result from this process are unintentional.

## 2017-01-03 NOTE — H&P (Signed)
[place] [facility] [place] NAME: Zenon Leaf    MR#:  [id number]  DATE OF BIRTH:  Nov 21, 1955  DATE OF ADMISSION:  01/03/2017  PRIMARY CARE PHYSICIAN: Patient, No Pcp Per   REQUESTING/REFERRING PHYSICIAN: Dr. Delman Kitten  CHIEF COMPLAINT:   Chief Complaint  Patient presents with  . Hematemesis    HISTORY OF PRESENT ILLNESS:  Sumedh Shinsato  is a 61 y.o. male with a known history of liver cirrhosis from alcohol use and also hepatitis C, hypertension, COPD, ongoing smoking presents to hospital secondary to hematemesis that started last night. Patient was admitted in the hospital last week for ascites and had almost 4 L drained out on 12/30/2016. Prior to that he had a total of 8.5 L drained out on 12/24/2016 and 12/25/16. He went to see his GI physician about 3 days ago. They have started lactulose some omeprazole for him and were planning to do an endoscopy and colonoscopy in 2 days. Last night he was coughing and suddenly had huge bloody vomitus. The episode happened a few times and then once in the try just this morning. Admission hemoglobin is 14 and repeat is pending at this time. Abdomen is still distended but he doesn't feel very uncomfortable.he does take baby aspirin occasionally. Not on any blood thinners. INR is at 1.1  PAST MEDICAL HISTORY:   Past Medical History:  Diagnosis Date  . Alcohol use   . COPD (chronic obstructive pulmonary disease) ([place])   . Hepatitis C   . Hypertension   . Liver cirrhosis ([place])   . Tobacco use     PAST SURGICAL HISTORY:   Past Surgical History:  Procedure Laterality Date  . BACK SURGERY      SOCIAL HISTORY:   Social History  Substance Use Topics  . Smoking status: Current Every Day Smoker    Packs/day: 0.50    Types: Cigarettes  . Smokeless tobacco: Former Systems developer  . Alcohol use 1.8 oz/week    3 Cans of beer per week     Comment: no alcohol in 4 or 5 weeks    FAMILY HISTORY:  No family history  on file.  DRUG ALLERGIES:  No Known Allergies  REVIEW OF SYSTEMS:   Review of Systems  Constitutional: Positive for malaise/fatigue. Negative for chills, fever and weight loss.  HENT: Negative for ear discharge, ear pain, hearing loss and nosebleeds.   Eyes: Negative for blurred vision, double vision and photophobia.  Respiratory: Positive for cough, shortness of breath and wheezing. Negative for hemoptysis.   Cardiovascular: Negative for chest pain, palpitations, orthopnea and leg swelling.  Gastrointestinal: Positive for abdominal pain and nausea. Negative for constipation, diarrhea, heartburn, melena and vomiting.       Hemetemesis  Genitourinary: Negative for dysuria, frequency, hematuria and urgency.  Musculoskeletal: Negative for back pain, myalgias and neck pain.  Skin: Negative for rash.  Neurological: Negative for dizziness, sensory change, speech change, focal weakness and headaches.  Endo/Heme/Allergies: Does not bruise/bleed easily.  Psychiatric/Behavioral: Negative for depression.    MEDICATIONS AT HOME:   Prior to Admission medications   Medication Sig Start Date End Date Taking? Authorizing Provider  lisinopril (PRINIVIL,ZESTRIL) 20 MG tablet Take 20 mg by mouth daily. 08/14/16 08/14/17 Yes [provider]  albuterol (PROVENTIL HFA;VENTOLIN HFA) 108 (90 Base) MCG/ACT inhaler Inhale 2 puffs into the lungs every 4 (four) hours as needed for wheezing. 12/27/16   Loletha Grayer, MD  albuterol (PROVENTIL) (2.5 MG/3ML) 0.083%  nebulizer solution Take 3 mLs (2.5 mg total) by nebulization every 4 (four) hours as needed for wheezing. 12/27/16   Loletha Grayer, MD  ciprofloxacin (CIPRO) 500 MG tablet Take 1 tablet (500 mg total) by mouth daily with breakfast. 12/27/16   Loletha Grayer, MD  clonazePAM (KLONOPIN) 0.5 MG tablet Take 1 tablet (0.5 mg total) by mouth 2 (two) times daily as needed for anxiety. 12/27/16   Loletha Grayer, MD  Fluticasone-Salmeterol (ADVAIR DISKUS)  250-50 MCG/DOSE AEPB Inhale 1 puff into the lungs 2 (two) times daily. 12/27/16   Loletha Grayer, MD  furosemide (LASIX) 20 MG tablet Take 1 tablet (20 mg total) by mouth 2 (two) times daily. 12/27/16 12/27/17  Loletha Grayer, MD  nicotine (NICODERM CQ - DOSED IN MG/24 HR) 7 mg/24hr patch Place 1 patch (7 mg total) onto the skin daily. 12/27/16   Loletha Grayer, MD  polyethylene glycol (MIRALAX / GLYCOLAX) packet Take 17 g by mouth daily as needed. 12/27/16   Loletha Grayer, MD  predniSONE (DELTASONE) 10 MG tablet 4 tablets daily for 2 more days 12/27/16   Loletha Grayer, MD  spironolactone (ALDACTONE) 25 MG tablet Take 1 tablet (25 mg total) by mouth 2 (two) times daily. 12/27/16   Loletha Grayer, MD  tiotropium (SPIRIVA HANDIHALER) 18 MCG inhalation capsule Place 1 capsule (18 mcg total) into inhaler and inhale daily. 12/27/16 12/27/17  Loletha Grayer, MD  traMADol (ULTRAM) 50 MG tablet Take 1 tablet (50 mg total) by mouth every 6 (six) hours as needed for moderate pain. 12/27/16   Loletha Grayer, MD      VITAL SIGNS:  Blood pressure 128/82, pulse (!) 109, temperature 98.6 F (37 C), temperature source Oral, resp. rate 19, height 5\' 8"  (1.727 m), weight 104.3 kg (230 lb), SpO2 97 %.  PHYSICAL EXAMINATION:   Physical Exam  GENERAL:  61 y.o.-year-old patient lying in the bed with no acute distress.  EYES: Pupils equal, round, reactive to light and accommodation. No scleral icterus. Extraocular muscles intact.  HEENT: Head atraumatic, normocephalic. Oropharynx and nasopharynx clear. Dried blood noted around the mouth NECK:  Supple, no jugular venous distention. No thyroid enlargement, no tenderness.  LUNGS: moving air bilaterally, scattered expiratorywheezing, no rales,rhonchi or crepitation. No use of accessory muscles of respiration.  CARDIOVASCULAR: S1, S2 normal. No murmurs, rubs, or gallops.  ABDOMEN: Soft, distended, generalized discomfort on palpation. Bowel sounds present. No organomegaly  or mass.  EXTREMITIES: No pedal edema, cyanosis, or clubbing. No tremors noted NEUROLOGIC: Cranial nerves II through XII are intact. Muscle strength 5/5 in all extremities. Sensation intact. Gait not checked.  PSYCHIATRIC: The patient is alert and oriented x 3.  SKIN: palmar erythema noted. Significant spider nevae on the thorax.  LABORATORY PANEL:   CBC  Recent Labs Lab 01/03/17 1233  WBC 13.9*  HGB 14.4  HCT 41.2  PLT 153   ------------------------------------------------------------------------------------------------------------------  Chemistries   Recent Labs Lab 01/03/17 1233  NA 128*  K 3.8  CL 92*  CO2 26  GLUCOSE 118*  BUN 17  CREATININE 0.95  CALCIUM 9.0  AST 59*  ALT 51  ALKPHOS 115  BILITOT 1.7*   ------------------------------------------------------------------------------------------------------------------  Cardiac Enzymes No results for input(s): TROPONINI in the last 168 hours. ------------------------------------------------------------------------------------------------------------------  RADIOLOGY:  No results found.  EKG:   Orders placed or performed during the hospital encounter of 01/03/17  . EKG 12-Lead  . EKG 12-Lead    IMPRESSION AND PLAN:   Jordan Pardini  is a 61 y.o. male with  a known history of liver cirrhosis from alcohol use and also hepatitis C, hypertension, COPD, ongoing smoking presents to hospital secondary to hematemesis that started last night.  #1 hematemesis-has significant liver cirrhosis, so it could be a variceal bleed. -GI consulted. Hemoglobin checked every 8 hours -Type and crossmatch. Started on octreotide drip and Protonix drip. -Keep nothing by mouth for now  #2 massive recurrent ascites-paracentesis done 3 times in the last 10 days, the last one being last week an almost 4 L taken out. -Abdomen is significantly distended again. Discussed with GI, they have recommended paracentesis prior to EGD. -We'll  start on IV Lasix and also Aldactone. -empirically on Rocephin  #3 liver cirrhosis-secondary to history of alcohol use and also hepatitis C. -Outpatient follow-up with GI for hepatitis C treatment. Patient states he has stopped drinking for almost 2 months now.  #4 COPD-with ongoing smoking. -Duo nebs when necessary,  counseled against smoking.  #5 anxiety-continue Klonopin  #6 DVT prophylaxis-with the GI bleed, hold off blood thinners. On teds and SCDs    All the records are reviewed and case discussed with ED provider. Management plans discussed with the patient, family and they are in agreement.  CODE STATUS: Full code  TOTAL TIME TAKING CARE OF THIS PATIENT: 50 minutes.    Gladstone Lighter M.D on 01/03/2017 at 1:27 PM  Between 7am to 6pm - Pager - [phone]  After 6pm go to [email or web address] - password [email or web address] [facility]  Office  [phone]  CC: Primary care physician; Patient, No Pcp Per

## 2017-01-03 NOTE — Progress Notes (Signed)
Pharmacy Antibiotic Note  Bradley Dunlap is a 61 y.o. male admitted on 01/03/2017 with intra abdominal.  Pharmacy has been consulted for ceftriaxone dosing.  Plan: ceftriaxone 2gm iv q24h    Height: 5\' 8"  (172.7 cm) Weight: 230 lb (104.3 kg) IBW/kg (Calculated) : 68.4  Temp (24hrs), Avg:98.7 F (37.1 C), Min:98.6 F (37 C), Max:98.8 F (37.1 C)   Recent Labs Lab 12/30/16 1155 01/03/17 1233  WBC 14.1* 13.9*  CREATININE 0.78 0.95    Estimated Creatinine Clearance: 96.8 mL/min (by C-G formula based on SCr of 0.95 mg/dL).    No Known Allergies  Antimicrobials this admission: 9/9 ceftriaxone >>    Microbiology results: Recent Results (from the past 240 hour(s))  Body fluid culture     Status: None   Collection Time: 12/30/16  1:05 PM  Result Value Ref Range Status   Specimen Description PERITONEAL  Final   Special Requests PERITONEAL  Final   Gram Stain   Final    MODERATE WBC PRESENT,BOTH PMN AND MONONUCLEAR NO ORGANISMS SEEN    Culture   Final    NO GROWTH 3 DAYS Performed at [facility], [address] [place], [place] [address]    Report Status 01/02/2017 FINAL  Final    Thank you for allowing pharmacy to be a part of this patient's care.  Donna Christen Caralina Nop 01/03/2017 6:29 PM

## 2017-01-03 NOTE — ED Notes (Signed)
Pt transported to room 219

## 2017-01-03 NOTE — Progress Notes (Signed)
Per Ultrasound tech, paracentesis will be done 01/04/2017.

## 2017-01-04 ENCOUNTER — Inpatient Hospital Stay: Payer: Medicaid Other | Admitting: Anesthesiology

## 2017-01-04 ENCOUNTER — Encounter
Admission: EM | Disposition: A | Discharge status: Home Health | Payer: Self-pay | Source: Home / Self Care | Attending: Internal Medicine

## 2017-01-04 ENCOUNTER — Ambulatory Visit: Payer: Medicaid Other | Attending: Gastroenterology

## 2017-01-04 ENCOUNTER — Inpatient Hospital Stay: Payer: Medicaid Other

## 2017-01-04 ENCOUNTER — Encounter: Payer: Self-pay | Admitting: Anesthesiology

## 2017-01-04 HISTORY — PX: ESOPHAGOGASTRODUODENOSCOPY: SHX5428

## 2017-01-04 LAB — BASIC METABOLIC PANEL
ANION GAP: 7 (ref 5–15)
BUN: 19 mg/dL (ref 6–20)
CHLORIDE: 97 mmol/L — AB (ref 101–111)
CO2: 25 mmol/L (ref 22–32)
Calcium: 8.2 mg/dL — ABNORMAL LOW (ref 8.9–10.3)
Creatinine, Ser: 0.8 mg/dL (ref 0.61–1.24)
GFR calc Af Amer: >60 mL/min (ref >60)
GLUCOSE: 112 mg/dL — AB (ref 65–99)
POTASSIUM: 3.9 mmol/L (ref 3.5–5.1)
Sodium: 129 mmol/L — ABNORMAL LOW (ref 135–145)

## 2017-01-04 LAB — BODY FLUID CELL COUNT WITH DIFFERENTIAL
Eos, Fluid: 0 %
Lymphs, Fluid: 15 %
Monocyte-Macrophage-Serous Fluid: 44 %
Neutrophil Count, Fluid: 41 %
Total Nucleated Cell Count, Fluid: 373 cu mm

## 2017-01-04 LAB — PROTEIN, PLEURAL OR PERITONEAL FLUID

## 2017-01-04 LAB — CBC
HEMATOCRIT: 35.1 % — AB (ref 40.0–52.0)
HEMOGLOBIN: 12.3 g/dL — AB (ref 13.0–18.0)
MCH: 32.7 pg (ref 26.0–34.0)
MCHC: 35 g/dL (ref 32.0–36.0)
MCV: 93.5 fL (ref 80.0–100.0)
Platelets: 114 10*3/uL — ABNORMAL LOW (ref 150–440)
RBC: 3.76 MIL/uL — ABNORMAL LOW (ref 4.40–5.90)
RDW: 13.9 % (ref 11.5–14.5)
WBC: 9.7 10*3/uL (ref 3.8–10.6)

## 2017-01-04 LAB — HEMOGLOBIN
HEMOGLOBIN: 12 g/dL — AB (ref 13.0–18.0)
Hemoglobin: 12.5 g/dL — ABNORMAL LOW (ref 13.0–18.0)

## 2017-01-04 LAB — LACTATE DEHYDROGENASE, PLEURAL OR PERITONEAL FLUID: LD FL: 27 U/L — AB (ref 3–23)

## 2017-01-04 LAB — HEMOGLOBIN AND HEMATOCRIT, BLOOD
HEMATOCRIT: 34.2 % — AB (ref 40.0–52.0)
HEMOGLOBIN: 12.1 g/dL — AB (ref 13.0–18.0)

## 2017-01-04 LAB — PATHOLOGIST SMEAR REVIEW

## 2017-01-04 SURGERY — EGD (ESOPHAGOGASTRODUODENOSCOPY)
Anesthesia: General

## 2017-01-04 MED ORDER — MIDAZOLAM HCL 2 MG/2ML IJ SOLN
INTRAMUSCULAR | Status: AC
  Filled 2017-01-04: qty 2

## 2017-01-04 MED ORDER — LIDOCAINE HCL (CARDIAC) 20 MG/ML IV SOLN
INTRAVENOUS | Status: DC | PRN
  Administered 2017-01-04: 30 mg via INTRAVENOUS

## 2017-01-04 MED ORDER — ONDANSETRON HCL 4 MG/2ML IJ SOLN
4.0000 mg | Freq: Once | INTRAMUSCULAR | Status: DC
  Filled 2017-01-04: qty 2

## 2017-01-04 MED ORDER — PROPOFOL 500 MG/50ML IV EMUL
INTRAVENOUS | Status: AC
  Filled 2017-01-04: qty 50

## 2017-01-04 MED ORDER — GLYCOPYRROLATE 0.2 MG/ML IJ SOLN
INTRAMUSCULAR | Status: DC | PRN
  Administered 2017-01-04: 0.1 mg via INTRAVENOUS

## 2017-01-04 MED ORDER — MORPHINE SULFATE (PF) 4 MG/ML IV SOLN
4.0000 mg | INTRAVENOUS | Status: DC | PRN
  Administered 2017-01-04 – 2017-01-05 (×3): 4 mg via INTRAVENOUS
  Filled 2017-01-04 (×3): qty 1

## 2017-01-04 MED ORDER — ONDANSETRON HCL 4 MG/2ML IJ SOLN
INTRAMUSCULAR | Status: AC
Start: 2017-01-04 — End: 2017-01-04
  Administered 2017-01-04: 13:00:00
  Filled 2017-01-04: qty 2

## 2017-01-04 MED ORDER — IPRATROPIUM-ALBUTEROL 0.5-2.5 (3) MG/3ML IN SOLN
3.0000 mL | Freq: Once | RESPIRATORY_TRACT | Status: AC
  Administered 2017-01-04: 3 mL via RESPIRATORY_TRACT

## 2017-01-04 MED ORDER — GUAIFENESIN-DM 100-10 MG/5ML PO SYRP
5.0000 mL | ORAL_SOLUTION | ORAL | Status: DC | PRN
  Administered 2017-01-04 – 2017-01-05 (×2): 5 mL via ORAL
  Filled 2017-01-04 (×3): qty 5

## 2017-01-04 MED ORDER — ALBUMIN HUMAN 25 % IV SOLN
12.5000 g | Freq: Once | INTRAVENOUS | Status: AC
  Administered 2017-01-04: 12.5 g via INTRAVENOUS
  Filled 2017-01-04: qty 50

## 2017-01-04 MED ORDER — FENTANYL CITRATE (PF) 100 MCG/2ML IJ SOLN
INTRAMUSCULAR | Status: DC | PRN
  Administered 2017-01-04: 50 ug via INTRAVENOUS

## 2017-01-04 MED ORDER — EPHEDRINE SULFATE 50 MG/ML IJ SOLN
INTRAMUSCULAR | Status: DC | PRN
  Administered 2017-01-04: 5 mg via INTRAVENOUS
  Administered 2017-01-04: 10 mg via INTRAVENOUS

## 2017-01-04 MED ORDER — LIDOCAINE HCL (PF) 2 % IJ SOLN
INTRAMUSCULAR | Status: AC
  Filled 2017-01-04: qty 2

## 2017-01-04 MED ORDER — MIDAZOLAM HCL 2 MG/2ML IJ SOLN
INTRAMUSCULAR | Status: DC | PRN
  Administered 2017-01-04: 1 mg via INTRAVENOUS

## 2017-01-04 MED ORDER — PROPOFOL 500 MG/50ML IV EMUL
INTRAVENOUS | Status: DC | PRN
  Administered 2017-01-04: 100 ug/kg/min via INTRAVENOUS

## 2017-01-04 MED ORDER — GLYCOPYRROLATE 0.2 MG/ML IJ SOLN
INTRAMUSCULAR | Status: AC
  Filled 2017-01-04: qty 1

## 2017-01-04 MED ORDER — SPIRONOLACTONE 25 MG PO TABS
100.0000 mg | ORAL_TABLET | Freq: Every day | ORAL | Status: DC
Start: 2017-01-05 — End: 2017-01-05
  Administered 2017-01-05: 100 mg via ORAL
  Filled 2017-01-04: qty 4

## 2017-01-04 MED ORDER — SODIUM CHLORIDE 0.9 % IV SOLN
INTRAVENOUS | Status: DC
  Administered 2017-01-04: 11:00:00 via INTRAVENOUS

## 2017-01-04 MED ORDER — IOPAMIDOL (ISOVUE-300) INJECTION 61%
100.0000 mL | Freq: Once | INTRAVENOUS | Status: AC | PRN
Start: 2017-01-04 — End: 2017-01-04
  Administered 2017-01-04: 100 mL via INTRAVENOUS

## 2017-01-04 MED ORDER — FUROSEMIDE 20 MG PO TABS
20.0000 mg | ORAL_TABLET | Freq: Every day | ORAL | Status: DC
  Administered 2017-01-04 – 2017-01-05 (×2): 20 mg via ORAL
  Filled 2017-01-04 (×2): qty 1

## 2017-01-04 MED ORDER — IPRATROPIUM-ALBUTEROL 0.5-2.5 (3) MG/3ML IN SOLN
RESPIRATORY_TRACT | Status: AC
  Filled 2017-01-04: qty 3

## 2017-01-04 MED ORDER — FENTANYL CITRATE (PF) 100 MCG/2ML IJ SOLN
INTRAMUSCULAR | Status: AC
  Filled 2017-01-04: qty 2

## 2017-01-04 NOTE — OR Nursing (Signed)
Report called to 2nd floor by jessie,rn. Pt to remain npo except ice ,  With repeat h/h at 1700. Pt transferred back to floor with orderly

## 2017-01-04 NOTE — Anesthesia Postprocedure Evaluation (Signed)
Anesthesia Post Note  Patient: Bradley Dunlap  Procedure(s) Performed: Procedure(s) (LRB): ESOPHAGOGASTRODUODENOSCOPY (EGD) (N/A)  Patient location during evaluation: Endoscopy Anesthesia Type: General Level of consciousness: awake and alert Pain management: pain level controlled Vital Signs Assessment: post-procedure vital signs reviewed and stable Respiratory status: spontaneous breathing, nonlabored ventilation, respiratory function stable and patient connected to nasal cannula oxygen Cardiovascular status: blood pressure returned to baseline and stable Postop Assessment: no signs of nausea or vomiting Anesthetic complications: no     Last Vitals:  Vitals:   01/04/17 1200 01/04/17 1210  BP: 129/82 108/67  Pulse: 93 93  Resp: 13 (!) 23  Temp:    SpO2: 100% 96%    Last Pain:  Vitals:   01/04/17 1150  TempSrc: Tympanic  PainSc:                  Precious Haws Zaeem Kandel

## 2017-01-04 NOTE — H&P (Signed)
Cephas Darby, MD [address]  [place]  [place], [place] [address]  Main: [phone]  Fax: [phone] Pager: [phone]  Primary Care Physician:  Patient, No Pcp Per Primary Gastroenterologist:  Dr. Cephas Darby  Pre-Procedure History & Physical: HPI:  Bradley Dunlap is a 61 y.o. male is here for an endoscopy.   Past Medical History:  Diagnosis Date  . Alcohol use   . Ascites   . COPD (chronic obstructive pulmonary disease) ([facility])   . Hepatitis C   . Hypertension   . Liver cirrhosis ([place])   . Tobacco use     Past Surgical History:  Procedure Laterality Date  . BACK SURGERY      Prior to Admission medications   Medication Sig Start Date End Date Taking? Authorizing Provider  albuterol (PROVENTIL HFA;VENTOLIN HFA) 108 (90 Base) MCG/ACT inhaler Inhale 2 puffs into the lungs every 4 (four) hours as needed for wheezing. 12/27/16  Yes Wieting, Richard, MD  albuterol (PROVENTIL) (2.5 MG/3ML) 0.083% nebulizer solution Take 3 mLs (2.5 mg total) by nebulization every 4 (four) hours as needed for wheezing. 12/27/16  Yes Loletha Grayer, MD  ciprofloxacin (CIPRO) 500 MG tablet Take 1 tablet (500 mg total) by mouth daily with breakfast. 12/27/16  Yes Wieting, Richard, MD  clonazePAM (KLONOPIN) 0.5 MG tablet Take 1 tablet (0.5 mg total) by mouth 2 (two) times daily as needed for anxiety. 12/27/16  Yes Wieting, Richard, MD  Fluticasone-Salmeterol (ADVAIR DISKUS) 250-50 MCG/DOSE AEPB Inhale 1 puff into the lungs 2 (two) times daily. 12/27/16  Yes Wieting, Richard, MD  furosemide (LASIX) 20 MG tablet Take 1 tablet (20 mg total) by mouth 2 (two) times daily. 12/27/16 12/27/17 Yes Wieting, Richard, MD  nicotine (NICODERM CQ - DOSED IN MG/24 HR) 7 mg/24hr patch Place 1 patch (7 mg total) onto the skin daily. 12/27/16  Yes Wieting, Richard, MD  polyethylene glycol (MIRALAX / GLYCOLAX) packet Take 17 g by mouth daily as needed. 12/27/16  Yes Wieting, Richard, MD  spironolactone (ALDACTONE) 25 MG  tablet Take 1 tablet (25 mg total) by mouth 2 (two) times daily. 12/27/16  Yes Wieting, Richard, MD  traMADol (ULTRAM) 50 MG tablet Take 1 tablet (50 mg total) by mouth every 6 (six) hours as needed for moderate pain. 12/27/16  Yes Wieting, Richard, MD  lisinopril (PRINIVIL,ZESTRIL) 20 MG tablet Take 20 mg by mouth daily. 08/14/16 08/14/17  [provider]  predniSONE (DELTASONE) 10 MG tablet 4 tablets daily for 2 more days Patient not taking: Reported on 01/03/2017 12/27/16   Loletha Grayer, MD  tiotropium (SPIRIVA HANDIHALER) 18 MCG inhalation capsule Place 1 capsule (18 mcg total) into inhaler and inhale daily. Patient not taking: Reported on 01/03/2017 12/27/16 12/27/17  Loletha Grayer, MD    Allergies as of 01/03/2017  . (No Known Allergies)    History reviewed. No pertinent family history.  Social History   Social History  . Marital status: Single    Spouse name: N/A  . Number of children: N/A  . Years of education: N/A   Occupational History  . Not on file.   Social History Main Topics  . Smoking status: Current Every Day Smoker    Packs/day: 0.50    Types: Cigarettes  . Smokeless tobacco: Former Systems developer  . Alcohol use 1.8 oz/week    3 Cans of beer per week     Comment: 2-3 months  . Drug use: No  . Sexual activity: Not Currently  Birth control/ protection: Abstinence   Other Topics Concern  . Not on file   Social History Narrative   Lives at home by himself. Independent at baseline    Review of Systems: See HPI, otherwise negative ROS  Physical Exam: BP 99/68   Pulse 76   Temp 97.8 F (36.6 C) (Tympanic)   Resp 18   Ht 5\' 8"  (1.727 m)   Wt 220 lb (99.8 kg)   SpO2 99%   BMI 33.45 kg/m  General:   Alert,  pleasant and cooperative in NAD Head:  Normocephalic and atraumatic. Neck:  Supple; no masses or thyromegaly. Lungs:  Clear throughout to auscultation.    Heart:  Regular rate and rhythm. Abdomen:  Soft, nontender and distended. Normal bowel sounds,  without guarding, and without rebound.   Neurologic:  Alert and  oriented x4;  grossly normal neurologically.  Impression/Plan: Bradley Dunlap is here for an endoscopy to be performed for hematemesis  Risks, benefits, limitations, and alternatives regarding  endoscopy have been reviewed with the patient.  Questions have been answered.  All parties agreeable.   Sherri Sear, MD  01/04/2017, 11:16 AM

## 2017-01-04 NOTE — Anesthesia Preprocedure Evaluation (Signed)
Anesthesia Evaluation  Patient identified by MRN, date of birth, ID band Patient awake    Reviewed: Allergy & Precautions, H&P , NPO status , Patient's Chart, lab work & pertinent test results  History of Anesthesia Complications (+) PROLONGED EMERGENCE and history of anesthetic complications  Airway Mallampati: III  TM Distance: <3 FB Neck ROM: limited    Dental  (+) Poor Dentition, Chipped, Missing   Pulmonary shortness of breath and with exertion, COPD, Current Smoker,           Cardiovascular Exercise Tolerance: Poor hypertension, (-) angina(-) Past MI      Neuro/Psych negative neurological ROS  negative psych ROS   GI/Hepatic negative GI ROS, neg GERD  ,(+) Cirrhosis       , Hepatitis -, C  Endo/Other  negative endocrine ROS  Renal/GU negative Renal ROS  negative genitourinary   Musculoskeletal   Abdominal   Peds  Hematology negative hematology ROS (+)   Anesthesia Other Findings Past Medical History: No date: Alcohol use No date: Ascites No date: COPD (chronic obstructive pulmonary disease) ([facility]) No date: Hepatitis C No date: Hypertension No date: Liver cirrhosis ([facility]) No date: Tobacco use  Past Surgical History: No date: BACK SURGERY  BMI    Body Mass Index:  33.45 kg/m      Reproductive/Obstetrics negative OB ROS                             Anesthesia Physical Anesthesia Plan  ASA: IV  Anesthesia Plan: General   Post-op Pain Management:    Induction: Intravenous  PONV Risk Score and Plan: Propofol infusion  Airway Management Planned: Natural Airway and Nasal Cannula  Additional Equipment:   Intra-op Plan:   Post-operative Plan:   Informed Consent: I have reviewed the patients History and Physical, chart, labs and discussed the procedure including the risks, benefits and alternatives for the proposed anesthesia with the patient or authorized  representative who has indicated his/her understanding and acceptance.   Dental Advisory Given  Plan Discussed with: Anesthesiologist, CRNA and Surgeon  Anesthesia Plan Comments: (Patient informed that they are higher risk for complications from anesthesia during this procedure due to their medical history.  Patient voiced understanding.  Patient consented for risks of anesthesia including but not limited to:  - adverse reactions to medications - risk of intubation if required - damage to teeth, lips or other oral mucosa - sore throat or hoarseness - Damage to heart, brain, lungs or loss of life  Patient voiced understanding.)        Anesthesia Quick Evaluation

## 2017-01-04 NOTE — Procedures (Signed)
  US guided RLQ paracentesis  5.4 Liters cloudy yellow fluid obtained Sent for labs per MD  Tolerated well

## 2017-01-04 NOTE — Progress Notes (Signed)
Bradley Darby, MD [address]  [place]  [place], [place] [address]  Main: [phone]  Fax: [phone] Pager: [phone]   Bradley Dunlap is being followed for hematemesis Day 1 of follow up    Subjective: He had paracentesis today and 5.4 liters of cloudy yellow fluid was removed. He denies further episodes of hematemesis or melena since admission. He reports feeling significantly better after paracentesis   Objective: Vital signs in last 24 hours: Vitals:   01/04/17 0953 01/04/17 1058 01/04/17 1150 01/04/17 1200  BP: 102/65 99/68 (!) 101/54   Pulse: 71 76 91 93  Resp: 16 18 (!) 22 13  Temp: 98.8 F (37.1 C) 97.8 F (36.6 C) 97.8 F (36.6 C)   TempSrc: Oral Tympanic Tympanic   SpO2: 96% 99% 97% 100%  Weight:  220 lb (99.8 kg)    Height:  5\' 8"  (1.727 m)     Weight change:   Intake/Output Summary (Last 24 hours) at 01/04/17 1205 Last data filed at 01/04/17 1130  Gross per 24 hour  Intake          1478.27 ml  Output              650 ml  Net           828.27 ml     Exam: Heart:: Regular rate and rhythm or S1S2 present Lungs: clear to auscultation Abdomen: soft, nontender, normal bowel sounds   Lab Results: @LABTEST2 @ Micro Results: Recent Results (from the past 240 hour(s))  Body fluid culture     Status: None   Collection Time: 12/30/16  1:05 PM  Result Value Ref Range Status   Specimen Description PERITONEAL  Final   Special Requests PERITONEAL  Final   Gram Stain   Final    MODERATE WBC PRESENT,BOTH PMN AND MONONUCLEAR NO ORGANISMS SEEN    Culture   Final    NO GROWTH 3 DAYS Performed at [facility], [address] [place], [place] [address]    Report Status 01/02/2017 FINAL  Final   Studies/Results: US Paracentesis  Result Date: 01/04/2017 INDICATION: Recurrent ascites EXAM: ULTRASOUND-GUIDED PARACENTESIS COMPARISON:  Previous paracentesis. MEDICATIONS: 10 cc 1% lidocaine. COMPLICATIONS: None immediate. TECHNIQUE: Informed  written consent was obtained from the patient after a discussion of the risks, benefits and alternatives to treatment. A timeout was performed prior to the initiation of the procedure. Initial ultrasound scanning demonstrates a large amount of ascites within the right lower abdominal quadrant. The right lower abdomen was prepped and draped in the usual sterile fashion. 1% lidocaine with epinephrine was used for local anesthesia. Under direct ultrasound guidance, a 19 gauge, 7-cm, Yueh catheter was introduced. An ultrasound image was saved for documentation purposed. The paracentesis was performed. The catheter was removed and a dressing was applied. The patient tolerated the procedure well without immediate post procedural complication. FINDINGS: A total of approximately 5.4 liters of cloudy yellow fluid was removed. Samples were sent to the laboratory as requested by the clinical team. IMPRESSION: Successful ultrasound-guided paracentesis yielding 5.4 liters of peritoneal fluid. Read by Lavonia Drafts [facility] Electronically Signed   By: Inez Catalina M.D.   On: 01/04/2017 09:25   Medications: I have reviewed the patient's current medications. Scheduled Meds: . [MAR Hold] clonazePAM  0.5 mg Oral BID  . [MAR Hold] furosemide  40 mg Intravenous Daily  . [MAR Hold] ipratropium-albuterol  3 mL Nebulization Q6H  . ipratropium-albuterol      . [  MAR Hold] mometasone-formoterol  2 puff Inhalation BID  . [MAR Hold] nicotine  14 mg Transdermal Daily  . [MAR Hold] spironolactone  100 mg Oral Daily   Continuous Infusions: . sodium chloride 20 mL/hr at 01/04/17 1110  . [MAR Hold] albumin human Stopped (01/03/17 1400)  . albumin human    . [MAR Hold] cefTRIAXone (ROCEPHIN)  IV    . [MAR Hold] cefTRIAXone (ROCEPHIN)  IV    . octreotide  (SANDOSTATIN)    IV infusion 50 mcg/hr (01/04/17 0020)  . pantoprozole (PROTONIX) infusion 8 mg/hr (01/04/17 0020)   PRN Meds:.[MAR Hold] acetaminophen **OR** [MAR Hold]  acetaminophen, guaiFENesin-dextromethorphan, [MAR Hold] ondansetron **OR** [MAR Hold] ondansetron (ZOFRAN) IV, [MAR Hold] traMADol   Assessment: Active Problems:   Hematemesis  Bradley Dunlap is a 61 y.o. 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 His Hb is overall stable. He reports history of daily intake of regular aspirin for chronic Has. S/p therapeutic paracentesis, he also has SBP based on ascitic fluid analysis (this was performed after starting ceftriaxone) S/p EGD 01/04/17, showed large esophageal varices with stigmata of recent bleeding and one actively bleeding esophageal varix in mid esophagus, bleeding controlled after variceal ligation. Moderate portal HTN gastropathy. No evidence of ulcers.   Plan:  - Continue PPI and octreotide drips - Ceftriaxone 2gm IV daily to treat SBP - NPO except ice chips - Monitor CBC closely and transfuse as needed - Lasix 40mg  IV daily, start aldactone 100mg  daily - Monitor serum Na, K closely  - GI follow up at [facility] clinic after discharge - Needs repeat EGD for repeat variceal ligation in 2-3 weeks after discharge  Thank you for involving me in the care of this patient. I will continue to follow   LOS: 1 day   [name] 01/04/2017, 12:05 PM

## 2017-01-04 NOTE — Progress Notes (Signed)
[place] [facility] [place] NAME: Bradley Dunlap    MR#:  [id number]  DATE OF BIRTH:  [birth year]  SUBJECTIVE:  CHIEF COMPLAINT:   Chief Complaint  Patient presents with  . Hematemesis   Melena and better abdominal distention. Status post paracentesis this am with 5 L fluid. REVIEW OF SYSTEMS:  Review of Systems  Constitutional: Positive for malaise/fatigue. Negative for chills and fever.  HENT: Negative for sore throat.   Eyes: Negative for blurred vision and double vision.  Respiratory: Negative for cough, hemoptysis, shortness of breath, wheezing and stridor.   Cardiovascular: Negative for chest pain, palpitations, orthopnea and leg swelling.  Gastrointestinal: Positive for melena. Negative for abdominal pain, blood in stool, diarrhea, nausea and vomiting.  Genitourinary: Negative for dysuria, flank pain and hematuria.  Musculoskeletal: Negative for back pain and joint pain.  Skin: Negative for rash.  Neurological: Positive for weakness. Negative for dizziness, sensory change, focal weakness, seizures, loss of consciousness and headaches.  Endo/Heme/Allergies: Negative for polydipsia.  Psychiatric/Behavioral: Negative for depression. The patient is not nervous/anxious.     DRUG ALLERGIES:  No Known Allergies VITALS:  Blood pressure 126/71, pulse 87, temperature (!) 97.4 F (36.3 C), temperature source Oral, resp. rate 18, height 5\' 8"  (1.727 m), weight 220 lb (99.8 kg), SpO2 97 %. PHYSICAL EXAMINATION:  Physical Exam  Constitutional: He is oriented to person, place, and time.  Morbid obesity.  HENT:  Head: Normocephalic.  Mouth/Throat: Oropharynx is clear and moist.  Eyes: Pupils are equal, round, and reactive to light. Conjunctivae and EOM are normal. No scleral icterus.  Neck: Normal range of motion. Neck supple. No JVD present. No tracheal deviation present.  Cardiovascular: Normal rate, regular rhythm and normal heart sounds.   Exam reveals no gallop.   No murmur heard. Pulmonary/Chest: Effort normal and breath sounds normal. No respiratory distress. He has no wheezes. He has no rales.  Abdominal: Soft. Bowel sounds are normal. He exhibits distension. There is no tenderness. There is no rebound.  Musculoskeletal: Normal range of motion. He exhibits no edema or tenderness.  Neurological: He is alert and oriented to person, place, and time. No cranial nerve deficit.  Skin: No rash noted. No erythema.  Psychiatric: Affect normal.   LABORATORY PANEL:  Male CBC  Recent Labs Lab 01/04/17 0144 01/04/17 1041  WBC 9.7  --   HGB 12.3* 12.5*  HCT 35.1*  --   PLT 114*  --    ------------------------------------------------------------------------------------------------------------------ Chemistries   Recent Labs Lab 01/03/17 1233 01/04/17 0144  NA 128* 129*  K 3.8 3.9  CL 92* 97*  CO2 26 25  GLUCOSE 118* 112*  BUN 17 19  CREATININE 0.95 0.80  CALCIUM 9.0 8.2*  AST 59*  --   ALT 51  --   ALKPHOS 115  --   BILITOT 1.7*  --    RADIOLOGY:  US Paracentesis  Result Date: 01/04/2017 INDICATION: Recurrent ascites EXAM: ULTRASOUND-GUIDED PARACENTESIS COMPARISON:  Previous paracentesis. MEDICATIONS: 10 cc 1% lidocaine. COMPLICATIONS: None immediate. TECHNIQUE: Informed written consent was obtained from the patient after a discussion of the risks, benefits and alternatives to treatment. A timeout was performed prior to the initiation of the procedure. Initial ultrasound scanning demonstrates a large amount of ascites within the right lower abdominal quadrant. The right lower abdomen was prepped and draped in the usual sterile fashion. 1% lidocaine with epinephrine was used for local anesthesia. Under direct ultrasound guidance, a 19 gauge, 7-cm, Teressa Lower  catheter was introduced. An ultrasound image was saved for documentation purposed. The paracentesis was performed. The catheter was removed and a dressing was applied.  The patient tolerated the procedure well without immediate post procedural complication. FINDINGS: A total of approximately 5.4 liters of cloudy yellow fluid was removed. Samples were sent to the laboratory as requested by the clinical team. IMPRESSION: Successful ultrasound-guided paracentesis yielding 5.4 liters of peritoneal fluid. Read by Lavonia Drafts [facility] Electronically Signed   By: Inez Catalina M.D.   On: 01/04/2017 09:25   ASSESSMENT AND PLAN:   Bradley Dunlap  is a 61 y.o. male with a known history of liver cirrhosis from alcohol use and also hepatitis C, hypertension, COPD, ongoing smoking presents to hospital secondary to hematemesis that started last night.  #1 hematemesis due to GIB secondary to variceal bleed. EGD today shows Portal hypertensive gastropathy. - Grade III and large (> 5 mm) esophageal varices. Incompletely eradicated. Banded. Per Dr. Marius Ditch, - NPO except ice chips. - Continue present medications. - Continue PPI and octreotide drips - Ceftriaxone for SBP prophylaxis - Monitor CBC closely, every 6hrs - Repeat upper endoscopy in 2 weeks for endoscopic band ligation.  #2 massive recurrent ascites-paracentesis done 3 times in the last 10 days, the last one being last week an almost 4 L taken out. s/p paracentesis with 5 L fluid drawal prior to EGD today. continue IV Lasix and Aldactone. Continue Rocephin.  #3 liver cirrhosis-secondary to history of alcohol use and also hepatitis C. -Outpatient follow-up with GI for hepatitis C treatment. Patient states he has stopped drinking for almost 2 months ago.  #4 COPD-with ongoing smoking. -Duo nebs when necessary, stable. Smoking cessation was counseled for 3-4 minutes.  #5 anxiety-continue Klonopin  Hyponatremia. Due to liver cirrhosis. Follow-up BMP. Morbid obesity. Generalized weakness. PT evaluation.  I discussed with Dr. Marius Ditch. All the records are reviewed and case discussed with Care Management/Social  Worker. Management plans discussed with the patient, family and they are in agreement.  CODE STATUS: Full Code  TOTAL TIME TAKING CARE OF THIS PATIENT: 41 minutes.   More than 50% of the time was spent in counseling/coordination of care: YES  POSSIBLE D/C IN 3 DAYS, DEPENDING ON CLINICAL CONDITION.   Demetrios Loll M.D on 01/04/2017 at 4:48 PM  Between 7am to 6pm - Pager - [phone]  After 6pm go to [email or web address] - [facility]

## 2017-01-04 NOTE — Transfer of Care (Signed)
   Immediate Anesthesia Transfer of Care Note  Patient: Bradley Dunlap  Procedure(s) Performed: Procedure(s): ESOPHAGOGASTRODUODENOSCOPY (EGD) (N/A)  Patient Location: PACU  Anesthesia Type:General  Level of Consciousness: awake and oriented  Airway & Oxygen Therapy: Patient Spontanous Breathing and Patient connected to nasal cannula oxygen  Post-op Assessment: Report given to RN and Post -op Vital signs reviewed and stable  Post vital signs: Reviewed and stable  Last Vitals:  Vitals:   01/04/17 0953 01/04/17 1058  BP: 102/65 99/68  Pulse: 71 76  Resp: 16 18  Temp: 37.1 C 36.6 C  SpO2: 96% 99%    Last Pain:  Vitals:   01/04/17 1058  TempSrc: Tympanic  PainSc: 9       Patients Stated Pain Goal: 0 (72/09/47 0962)  Complications: No apparent anesthesia complications

## 2017-01-04 NOTE — Anesthesia Procedure Notes (Signed)
Performed by: COOK-MARTIN, Bedie Dominey Pre-anesthesia Checklist: Patient identified, Emergency Drugs available, Suction available, Patient being monitored and Timeout performed Patient Re-evaluated:Patient Re-evaluated prior to induction Oxygen Delivery Method: Nasal cannula Preoxygenation: Pre-oxygenation with 100% oxygen Induction Type: IV induction Airway Equipment and Method: Bite block Placement Confirmation: CO2 detector and positive ETCO2       

## 2017-01-04 NOTE — Anesthesia Post-op Follow-up Note (Signed)
Anesthesia QCDR form completed.        

## 2017-01-04 NOTE — Progress Notes (Signed)
Per Dr Marius Ditch, will hold protonix drip until completion of Albumin

## 2017-01-04 NOTE — Plan of Care (Signed)
Problem: Nutrition: Goal: Adequate nutrition will be maintained Outcome: Progressing Patient was NPO. Has progressed to a clear liquid diet.

## 2017-01-04 NOTE — Op Note (Signed)
[facility] Gastroenterology Patient Name: Bradley Dunlap Procedure Date: 01/04/2017 11:15 AM MRN: [id number] Account #: [id number] Date of Birth: 07/02/1955 Admit Type: Outpatient Age: 61 Room: [facility] ENDO ROOM 3 Gender: Male Note Status: Finalized Procedure:            Upper GI endoscopy Indications:          Hematemesis Providers:            Lin Landsman MD, MD Referring MD:         No Local Md, MD (Referring MD) Medicines:            Monitored Anesthesia Care Complications:        No immediate complications. Estimated blood loss:                        Minimal. Procedure:            Pre-Anesthesia Assessment:                       - Prior to the procedure, a History and Physical was                        performed, and patient medications and allergies were                        reviewed. The patient is competent. The risks and                        benefits of the procedure and the sedation options and                        risks were discussed with the patient. All questions                        were answered and informed consent was obtained.                        Patient identification and proposed procedure were                        verified by the physician, the nurse, the                        anesthesiologist, the anesthetist and the technician in                        the pre-procedure area in the procedure room. Mental                        Status Examination: alert and oriented. Airway                        Examination: normal oropharyngeal airway and neck                        mobility. Respiratory Examination: clear to                        auscultation. CV Examination: normal. Prophylactic  Antibiotics: The patient does not require prophylactic                        antibiotics. Prior Anticoagulants: The patient has                        taken no previous anticoagulant or antiplatelet agents.             ASA Grade Assessment: III - A patient with severe                        systemic disease. After reviewing the risks and                        benefits, the patient was deemed in satisfactory                        condition to undergo the procedure. The anesthesia plan                        was to use monitored anesthesia care (MAC). Immediately                        prior to administration of medications, the patient was                        re-assessed for adequacy to receive sedatives. The                        heart rate, respiratory rate, oxygen saturations, blood                        pressure, adequacy of pulmonary ventilation, and                        response to care were monitored throughout the                        procedure. The physical status of the patient was                        re-assessed after the procedure.                       After obtaining informed consent, the endoscope was                        passed under direct vision. Throughout the procedure,                        the patient's blood pressure, pulse, and oxygen                        saturations were monitored continuously. The                        Colonoscope was introduced through the mouth, and                        advanced to the second part of duodenum. The upper GI  endoscopy was accomplished without difficulty. The                        patient tolerated the procedure well. Findings:      The duodenal bulb and second portion of the duodenum were normal.      Moderate portal hypertensive gastropathy was found in the gastric fundus       and in the gastric body, mild portal hypertensive gastroapthy in antrum.      Grade III, large (> 5 mm) varices were found in the middle third of the       esophagus and in the lower third of the esophagus with red wale signs       and ulcers, represent recent bleeding. Three bands were successfully       placed with  incomplete eradication of distal varices. One varix at 30cm       from incisors started bleeding, Bleeding was stopped by ligation of this       varix. There was no bleeding by the end of the procedure. Impression:           - Normal duodenal bulb and second portion of the                        duodenum.                       - Portal hypertensive gastropathy.                       - Grade III and large (> 5 mm) esophageal varices.                        Incompletely eradicated. Banded.                       - No specimens collected. Recommendation:       - Return patient to hospital ward for ongoing care.                       - NPO except ice chips.                       - Continue present medications.                       - Continue PPI and octreotide drips                       - Ceftriaxone for SBP prophylaxis                       - Monitor CBC closely, every 6hrs                       - Repeat upper endoscopy in 2 weeks for endoscopic band                        ligation. Procedure Code(s):    --- Professional ---                       [id number], Esophagogastroduodenoscopy, flexible, transoral;  with band ligation of esophageal/gastric varices Diagnosis Code(s):    --- Professional ---                       K76.6, Portal hypertension                       K31.89, Other diseases of stomach and duodenum                       I85.00, Esophageal varices without bleeding                       K92.0, Hematemesis CPT copyright 2016 American Medical Association. All rights reserved. The codes documented in this report are preliminary and upon coder review may  be revised to meet current compliance requirements. Dr. Ulyess Mort Lin Landsman MD, MD 01/04/2017 12:02:41 PM This report has been signed electronically. Number of Addenda: 0 Note Initiated On: 01/04/2017 11:15 AM      [facility]

## 2017-01-04 NOTE — Progress Notes (Signed)
PT Cancellation Note  PT consult received.  Pt currently off unit for procedure.  Will re-attempt PT eval at a later date/time.  Leitha Bleak, PT 01/04/17, 12:36 PM [phone]

## 2017-01-05 ENCOUNTER — Encounter: Payer: Self-pay | Admitting: Gastroenterology

## 2017-01-05 DIAGNOSIS — R188 Other ascites: Secondary | ICD-10-CM

## 2017-01-05 DIAGNOSIS — C220 Liver cell carcinoma: Secondary | ICD-10-CM

## 2017-01-05 DIAGNOSIS — I8501 Esophageal varices with bleeding: Secondary | ICD-10-CM

## 2017-01-05 LAB — CBC
HEMATOCRIT: 36 % — AB (ref 40.0–52.0)
Hemoglobin: 12.6 g/dL — ABNORMAL LOW (ref 13.0–18.0)
MCH: 33.2 pg (ref 26.0–34.0)
MCHC: 35.1 g/dL (ref 32.0–36.0)
MCV: 94.6 fL (ref 80.0–100.0)
Platelets: 105 10*3/uL — ABNORMAL LOW (ref 150–440)
RBC: 3.8 MIL/uL — AB (ref 4.40–5.90)
RDW: 14.4 % (ref 11.5–14.5)
WBC: 9.5 10*3/uL (ref 3.8–10.6)

## 2017-01-05 LAB — MISC LABCORP TEST (SEND OUT): Labcorp test code: 19588

## 2017-01-05 LAB — BASIC METABOLIC PANEL
ANION GAP: 8 (ref 5–15)
BUN: 18 mg/dL (ref 6–20)
CHLORIDE: 95 mmol/L — AB (ref 101–111)
CO2: 25 mmol/L (ref 22–32)
Calcium: 8.4 mg/dL — ABNORMAL LOW (ref 8.9–10.3)
Creatinine, Ser: 0.85 mg/dL (ref 0.61–1.24)
Glucose, Bld: 119 mg/dL — ABNORMAL HIGH (ref 65–99)
POTASSIUM: 3.8 mmol/L (ref 3.5–5.1)
SODIUM: 128 mmol/L — AB (ref 135–145)

## 2017-01-05 LAB — HIV ANTIBODY (ROUTINE TESTING W REFLEX): HIV SCREEN 4TH GENERATION: NONREACTIVE

## 2017-01-05 MED ORDER — PANTOPRAZOLE SODIUM 40 MG PO TBEC: DELAYED_RELEASE_TABLET | ORAL | 0 refills | Status: DC

## 2017-01-05 MED ORDER — FUROSEMIDE 40 MG PO TABS: 40.0000 mg | ORAL_TABLET | Freq: Every day | ORAL | 1 refills | Status: DC

## 2017-01-05 MED ORDER — LIDOCAINE VISCOUS 2 % MT SOLN
15.0000 mL | Freq: Four times a day (QID) | OROMUCOSAL | Status: DC | PRN
  Filled 2017-01-05: qty 15

## 2017-01-05 MED ORDER — CIPROFLOXACIN HCL 500 MG PO TABS: 500.0000 mg | ORAL_TABLET | Freq: Every day | ORAL | 0 refills | Status: DC

## 2017-01-05 MED ORDER — SPIRONOLACTONE 100 MG PO TABS: 100.0000 mg | ORAL_TABLET | Freq: Every day | ORAL | 0 refills | Status: DC

## 2017-01-05 MED ORDER — PANTOPRAZOLE SODIUM 40 MG PO TBEC: 40.0000 mg | DELAYED_RELEASE_TABLET | Freq: Two times a day (BID) | ORAL | Status: DC

## 2017-01-05 NOTE — Progress Notes (Signed)
Physical Therapy Evaluation Patient Details Name: Bradley Dunlap MRN: [id number] DOB: 11/13/1955 Today's Date: 01/05/2017   History of Present Illness  Pt was admitted for hematemesis, complaints of nausea and vomiting. PMH of COPD, HTN, liver cirrhosis, and smoking. Pt had paracentesis on 9/10 with 5.4 L removed, endo on 9/10 no significant findings.   Clinical Impression  Pt is a pleasant 61 year old male who was admitted for hematemesis, continues to experience nausea. Pt performs bed mob/transfers independently and amb with CGA, held onto IV pole while walking. Pt requested use of a SPC to help with walking safely at home and in the community. Pt demonstrates deficits with UE/LE strength and amb.  Pt would benefit from skilled PT to address above deficits and promote optimal return to home. Pt function appears close to baseline. Recommend transition to [place] upon DC from acute hospitalization.     Follow Up Recommendations Home health PT    Equipment Recommendations  Cane    Recommendations for Other Services       Precautions / Restrictions Precautions Precautions: None Restrictions Weight Bearing Restrictions: No      Mobility  Bed Mobility Overal bed mobility: Independent             General bed mobility comments: Pt able to get from supine to seated EOB, grabbed R bed rail with both hands to help pull himself up. No assist needed.   Transfers Overall transfer level: Independent Equipment used: None             General transfer comment: Pt able to sit to stand with no assist, no dizziness.   Ambulation/Gait Ambulation/Gait assistance: Min guard Ambulation Distance (Feet): 50 Feet Assistive device:  (pt held onto IV pole during amb, would recommend SPC) Gait Pattern/deviations: Step-through pattern     General Gait Details: Pt able to amb with CGA, no reported dizziness, slow pace due to feeling nauseated since morning.  Stairs             Wheelchair Mobility    Modified Rankin (Stroke Patients Only)       Balance Overall balance assessment: Needs assistance Sitting-balance support: No upper extremity supported;Feet supported Sitting balance-Leahy Scale: Good Sitting balance - Comments: Pt able to sit unsupported EOB, no dizziness   Standing balance support: No upper extremity supported Standing balance-Leahy Scale: Good Standing balance comment: Pt able to stand unsupported, able to fasten belt around shorts while standing, no dizziness.                             Pertinent Vitals/Pain Pain Assessment: 0-10 Pain Score: 9  Pain Location: abdomen/chest Pain Intervention(s): Limited activity within patient's tolerance;Monitored during session;Repositioned    Home Living Family/patient expects to be discharged to:: Private residence Living Arrangements: Alone   Type of Home: Apartment Home Access: Stairs to enter Entrance Stairs-Rails: Psychiatric nurse of Steps: one flight Home Layout: One level Home Equipment: None      Prior Function Level of Independence: Independent         Comments: Pt independent with ADLs, walks to mailbox, take out trash, spends most of his time within the home.      Hand Dominance        Extremity/Trunk Assessment   Upper Extremity Assessment Upper Extremity Assessment: Generalized weakness (UE MMT grossly 4/5)    Lower Extremity Assessment Lower Extremity Assessment: Generalized weakness (LE MMT grossly 4/5)  Cervical / Trunk Assessment Cervical / Trunk Assessment: Normal  Communication   Communication: No difficulties  Cognition Arousal/Alertness: Awake/alert Behavior During Therapy: WFL for tasks assessed/performed Overall Cognitive Status: Within Functional Limits for tasks assessed                                        General Comments      Exercises Other Exercises Other Exercises: B ankle pumps,  SLRs, hip abd/add 10x. Pt able to perform exercises with verbal cues for proper technique. Noted slight increase in breathing with exercise progression.   Assessment/Plan    PT Assessment Patient needs continued PT services  PT Problem List Decreased strength;Decreased activity tolerance;Decreased mobility;Decreased knowledge of use of DME;Cardiopulmonary status limiting activity       PT Treatment Interventions DME instruction;Gait training;Stair training;Functional mobility training;Therapeutic exercise;Patient/family education    PT Goals (Current goals can be found in the Care Plan section)  Acute Rehab PT Goals Patient Stated Goal: To return home PT Goal Formulation: With patient Time For Goal Achievement: 01/19/17 Potential to Achieve Goals: Good Additional Goals Additional Goal #1: Pt will increase LE strength to develop endurance to go up/down stairs to his apartment    Frequency Min 2X/week   Barriers to discharge        Co-evaluation               AM-PAC PT "6 Clicks" Daily Activity  Outcome Measure Difficulty turning over in bed (including adjusting bedclothes, sheets and blankets)?: A Lot Difficulty moving from lying on back to sitting on the side of the bed? : A Little Difficulty sitting down on and standing up from a chair with arms (e.g., wheelchair, bedside commode, etc,.)?: A Little Help needed moving to and from a bed to chair (including a wheelchair)?: A Little Help needed walking in hospital room?: A Little Help needed climbing 3-5 steps with a railing? : A Little 6 Click Score: 17    End of Session   Activity Tolerance: Patient limited by fatigue (and nausea) Patient left: in bed;with call bell/phone within reach;with family/visitor present Nurse Communication: Mobility status PT Visit Diagnosis: Other abnormalities of gait and mobility (R26.89);Muscle weakness (generalized) (M62.81);Difficulty in walking, not elsewhere classified  (R26.2);Pain Pain - part of body:  (abdomen)    Time: 9485-4627 PT Time Calculation (min) (ACUTE ONLY): 15 min   Charges:   PT Evaluation $PT Eval Low Complexity: 1 Low PT Treatments $Therapeutic Exercise: 8-22 mins   PT G Codes:   PT G-Codes **NOT FOR INPATIENT CLASS** Functional Assessment Tool Used: AM-PAC 6 Clicks Basic Mobility Functional Limitation: Mobility: Walking and moving around Mobility: Walking and Moving Around Current Status (O3500): At least 40 percent but less than 60 percent impaired, limited or restricted Mobility: Walking and Moving Around Goal Status [phone]): At least 20 percent but less than 40 percent impaired, limited or restricted    Manfred Arch, SPT  Manfred Arch 01/05/2017, 11:37 AM

## 2017-01-05 NOTE — Progress Notes (Signed)
Bradley Darby, MD [address]  [place], [place] [address]  Main: [phone]  Fax: [phone] Pager: [phone]   Bradley Dunlap is being followed for hematemesis Day 1 of follow up    Subjective: Denies any further hematemesis, no melena. Hb stable since EGD Abdomen is distended, feels gassy, passing gas Afebrile, no swelling of legs  Objective: Vital signs in last 24 hours: Vitals:   01/04/17 2129 01/05/17 0216 01/05/17 0408 01/05/17 0746  BP: (!) 104/51  123/70   Pulse: 86  76   Resp: 18  12   Temp: 98.4 F (36.9 C)  97.7 F (36.5 C)   TempSrc: Oral  Oral   SpO2: 95% 98% 94% 95%  Weight:      Height:       Weight change: -10 lb (-4.536 kg)  Intake/Output Summary (Last 24 hours) at 01/05/17 0844 Last data filed at 01/05/17 0403  Gross per 24 hour  Intake             1098 ml  Output             1025 ml  Net               73 ml     Exam: Heart:: Regular rate and rhythm or S1S2 present Lungs: clear to auscultation Abdomen: soft, nontender, distended, tympanic, normal bowel sounds   Lab Results: CBC Latest Ref Rng & Units 01/05/2017 01/04/2017 01/04/2017  WBC 3.8 - 10.6 K/uL 9.5 - -  Hemoglobin 13.0 - 18.0 g/dL 12.6(L) 12.0(L) 12.1(L)  Hematocrit 40.0 - 52.0 % 36.0(L) - 34.2(L)  Platelets 150 - 440 K/uL 105(L) - -   BMP Latest Ref Rng & Units 01/05/2017 01/04/2017 01/03/2017  Glucose 65 - 99 mg/dL 119(H) 112(H) 118(H)  BUN 6 - 20 mg/dL '18 19 17  ' Creatinine 0.61 - 1.24 mg/dL 0.85 0.80 0.95  Sodium 135 - 145 mmol/L 128(L) 129(L) 128(L)  Potassium 3.5 - 5.1 mmol/L 3.8 3.9 3.8  Chloride 101 - 111 mmol/L 95(L) 97(L) 92(L)  CO2 22 - 32 mmol/L '25 25 26  ' Calcium 8.9 - 10.3 mg/dL 8.4(L) 8.2(L) 9.0   Hepatic Function Latest Ref Rng & Units 01/03/2017 12/30/2016 12/27/2016  Total Protein 6.5 - 8.1 g/dL 8.0 8.0 7.1  Albumin 3.5 - 5.0 g/dL 3.6 3.8 3.2(L)  AST 15 - 41 U/L 59(H) 75(H) 56(H)  ALT 17 - 63 U/L 51 63 42  Alk Phosphatase 38 - 126 U/L 115 106  86  Total Bilirubin 0.3 - 1.2 mg/dL 1.7(H) 1.2 0.9  Bilirubin, Direct 0.1 - 0.5 mg/dL - 0.3 -    Micro Results: Recent Results (from the past 240 hour(s))  Body fluid culture     Status: None   Collection Time: 12/30/16  1:05 PM  Result Value Ref Range Status   Specimen Description PERITONEAL  Final   Special Requests PERITONEAL  Final   Gram Stain   Final    MODERATE WBC PRESENT,BOTH PMN AND MONONUCLEAR NO ORGANISMS SEEN    Culture   Final    NO GROWTH 3 DAYS Performed at [facility], [place] [address] [place], [place] [address]    Report Status 01/02/2017 FINAL  Final  Body fluid culture     Status: None (Preliminary result)   Collection Time: 01/04/17  8:35 AM  Result Value Ref Range Status   Specimen Description PERITONEAL  Final   Special Requests NONE  Final  Gram Stain   Final    RARE WBC PRESENT,BOTH PMN AND MONONUCLEAR NO ORGANISMS SEEN    Culture   Final    NO GROWTH < 24 HOURS Performed at [facility] [address], [place], [place] [address]    Report Status PENDING  Incomplete   Studies/Results: US Paracentesis  Result Date: 01/04/2017 INDICATION: Recurrent ascites EXAM: ULTRASOUND-GUIDED PARACENTESIS COMPARISON:  Previous paracentesis. MEDICATIONS: 10 cc 1% lidocaine. COMPLICATIONS: None immediate. TECHNIQUE: Informed written consent was obtained from the patient after a discussion of the risks, benefits and alternatives to treatment. A timeout was performed prior to the initiation of the procedure. Initial ultrasound scanning demonstrates a large amount of ascites within the right lower abdominal quadrant. The right lower abdomen was prepped and draped in the usual sterile fashion. 1% lidocaine with epinephrine was used for local anesthesia. Under direct ultrasound guidance, a 19 gauge, 7-cm, Yueh catheter was introduced. An ultrasound image was saved for documentation purposed. The paracentesis was performed. The catheter was removed and a  dressing was applied. The patient tolerated the procedure well without immediate post procedural complication. FINDINGS: A total of approximately 5.4 liters of cloudy yellow fluid was removed. Samples were sent to the laboratory as requested by the clinical team. IMPRESSION: Successful ultrasound-guided paracentesis yielding 5.4 liters of peritoneal fluid. Read by Lavonia Drafts [facility] Electronically Signed   By: Inez Catalina M.D.   On: 01/04/2017 09:25   Ct Abdomen W Wo Contrast  Result Date: 01/04/2017 CLINICAL DATA:  Status post EGD earlier today with demonstration of large esophageal varices. EXAM: CT ABDOMEN WITHOUT AND WITH CONTRAST TECHNIQUE: Multidetector CT imaging of the abdomen was performed following the standard protocol before and following the bolus administration of intravenous contrast. CONTRAST:  112m ISOVUE-300 IOPAMIDOL (ISOVUE-300) INJECTION 61% COMPARISON:  None. FINDINGS: Lower chest:  Basilar atelectasis noted bilaterally. Hepatobiliary: Markedly nodular liver contour compatible with reported clinical history of cirrhosis. 16 mm hypervascular lesion posterior right liver (segment VI) seen on image 32 of series 3 is highly suspicious for hepatoma given the rapid washout. A second 10 mm hypervascular lesion is seen just inferior to the above described lesion (image 39 series 3) raising concern for multifocal right hepatic disease. Gallbladder is distended. No intrahepatic or extrahepatic biliary dilation. Pancreas: No focal mass lesion. No dilatation of the main duct. No intraparenchymal cyst. No peripancreatic edema. Spleen: Spleen measures 13.7 cm craniocaudal length, upper normal. Adrenals/Urinary Tract: Adrenal glands unremarkable. Right kidney has normal CT imaging features. Left kidney is identified in the pelvis and incompletely visualized on this abdomen only study. Stomach/Bowel: Hypervascularity/hyperenhancement in the esophagus and esophagogastric junction is compatible with  reported clinical history of varices. No gross gastric varices are evident by CT. Duodenum is normally positioned as is the ligament of Treitz. Some small bowel loops measure upper normal for diameter. There is diffuse edema and wall thickening in the abdominal segments of the colon. Vascular/Lymphatic: There is abdominal aortic atherosclerosis without aneurysm. Portal vein and superior mesenteric vein are patent. Portal vein is nondistended. Splenic vein is patent. Left renal vein is nonenlarged and follows atypical course given the pelvic location of the left kidney. Increase in number of nondistended vessels in the left upper quadrant likely representing collateralization. Recanalization of the paraumbilical vein is compatible with portal venous hypertension. Other: Small volume ascites noted around the inferior liver and in the para colic gutters bilaterally. Musculoskeletal: Patient is status post lumbosacral fusion IMPRESSION: 1. Left kidney incompletely visualized due  to its pelvic location. The left renal vein is nonenlarged and no substantial collateralization can be identified feeding into the left renal venous anatomy. Increased number of small vessels in the left abdomen likely related to collateralization and recanalization of the paraumbilical vein is consistent with portal venous hypertension. 2. 16 mm hypervascular lesion in the posterior right liver with washout but no rim enhancement. Imaging findings in the setting of cirrhosis are diagnostic of hepatocellular carcinoma by AASLD criteria, but due to the small (<2cm) lesion size, do not meet criteria for transplantation evaluation. Multidisciplinary (surgical, oncological and interventional radiology) consultation may be warranted. A second similar but smaller lesion is identified more inferiorly in the right liver. 3. Diffuse wall thickening in the abdominal colon. This may be related to colitis, but can be seen in the setting of cirrhosis and  hypoalbuminemia. 4. Small volume ascites. 5.  Aortic Atherosclerois (ICD10-170.0) Electronically Signed   By: Misty Stanley M.D.   On: 01/04/2017 17:46   Medications: I have reviewed the patient's current medications. Scheduled Meds: . clonazePAM  0.5 mg Oral BID  . furosemide  20 mg Oral Daily  . ipratropium-albuterol  3 mL Nebulization Q6H  . mometasone-formoterol  2 puff Inhalation BID  . nicotine  14 mg Transdermal Daily  . ondansetron (ZOFRAN) IV  4 mg Intravenous Once  . spironolactone  100 mg Oral Daily   Continuous Infusions: . albumin human Stopped (01/03/17 1400)  . cefTRIAXone (ROCEPHIN)  IV    . cefTRIAXone (ROCEPHIN)  IV Stopped (01/04/17 1929)  . octreotide  (SANDOSTATIN)    IV infusion 50 mcg/hr (01/05/17 0203)  . pantoprozole (PROTONIX) infusion 8 mg/hr (01/05/17 0403)   PRN Meds:.acetaminophen **OR** acetaminophen, guaiFENesin-dextromethorphan, morphine injection, ondansetron **OR** ondansetron (ZOFRAN) IV, traMADol   Assessment: Active Problems:   Hematemesis  Bradley Dunlap is a 61 y.o. 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 His Hb is overall stable. He reports history of daily intake of regular aspirin for chronic Has. S/p therapeutic paracentesis, he also has SBP based on ascitic fluid analysis (this was performed after starting ceftriaxone) S/p EGD 01/04/17, showed large esophageal varices with stigmata of recent bleeding and one actively bleeding esophageal varix in mid esophagus, bleeding controlled after variceal ligation. Moderate portal HTN gastropathy. No evidence of ulcers. No recurrence of variceal bleed since admission and after EGD. 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   LOS: 2 days   Danise Dehne 01/05/2017, 8:44 AM

## 2017-01-05 NOTE — Discharge Instructions (Signed)
Smoking cessation. Low sodium diet.

## 2017-01-05 NOTE — Progress Notes (Signed)
IV's were removed. Discharge instructions, follow-up appointments, and prescriptions were provided to the pt and sister at bedside. The pt was taken downstairs via wheelchair by volunteer services.

## 2017-01-05 NOTE — Care Management Note (Signed)
Case Management Note  Patient Details  Name: Bradley Dunlap MRN: [id number] Date of Birth: 31-Mar-1956   Patient admitted for hematemesis.  Patient lives at home alone.  Sister at bedside.  PT has assessed patient and recommends cane at discharge. Cane delivered to room by Corene Cornea from [facility].  Patient provided home health agency preference.  Patient states that he does not have a preference.  Referral made to [place] with [facility] for nursing for medication management.  RNCM signing off.   Subjective/Objective:                    Action/Plan:   Expected Discharge Date:  01/05/17               Expected Discharge Plan:  [place]  In-House Referral:     Discharge planning Services  CM Consult  Post Acute Care Choice:  Durable Medical Equipment, Home Health Choice offered to:  Patient  DME Arranged:  Kasandra Knudsen DME Agency:  [facility] Arranged:  RN [facility] Agency:  [facility]  Status of Service:  Completed, signed off  If discussed at [place] of Stay Meetings, dates discussed:    Additional Comments:  Beverly Sessions, RN 01/05/2017, 11:36 AM

## 2017-01-05 NOTE — Discharge Summary (Signed)
[place] at [place] NAME: Bradley Dunlap    MR#:  [id number]  DATE OF BIRTH:  1955-08-07  DATE OF ADMISSION:  01/03/2017   ADMITTING PHYSICIAN: Gladstone Lighter, MD  DATE OF DISCHARGE: 01/05/2017 12:42 PM  PRIMARY CARE PHYSICIAN: Bradley Dunlap, No Pcp Per   ADMISSION DIAGNOSIS:  Ascites [R18.8] Hematemesis with nausea [K92.0] DISCHARGE DIAGNOSIS:  Active Problems:   Hematemesis  SECONDARY DIAGNOSIS:   Past Medical History:  Diagnosis Date  . Alcohol use   . Ascites   . COPD (chronic obstructive pulmonary disease) ([place])   . Hepatitis C   . Hypertension   . Liver cirrhosis ([place])   . Tobacco use    HOSPITAL COURSE:   Bradley Dunlap a 61 y.o. malewith a known history of liver cirrhosis from alcohol use and also hepatitis C, hypertension, COPD, ongoing smoking presents to hospital secondary to hematemesis that started last night.  #1 hematemesis due to GIB secondary to variceal bleed. EGD shows Portal hypertensive gastropathy. - Grade III and large (> 5 mm) esophageal varices. Incompletely eradicated. Banded. Per Dr. Marius Ditch, Start liquid diet, discontinued PPI and octreotide drips. Start Protonix twice a day for 2 weeks then daily. Recommend repeat EGD for repeat variceal ligation in 2-3 weeks after discharge.  #2 massive recurrent ascites and SBP. -paracentesis done 3 times in the last 10 days, the last one being last week an almost 4 L taken out. s/p paracentesis with 5 L fluid drawl yesterday. Per Dr. Marius Ditch, He was on Ceftriaxone for SBP prophylaxis Start ciprofloxacin 500mg  BID for 7days to treat SBP Lasix 40mg  po daily - Start aldactone 100mg  daily  #3 liver cirrhosis-secondary to history of alcohol use and also hepatitis C. -Outpatient follow-up with GI for hepatitis C treatment. Bradley Dunlap states he has stopped drinking for almost 2 months ago.  * Hepatic cancer per abdomen CT.  GI follow up at [facility] clinic after  discharge in 1-2 weeks per Dr. Marius Ditch.  #4 COPD-with ongoing smoking. -Duo nebs when necessary, stable. Smoking cessation was counseled for 3-4 minutes.  #5 anxiety-continue Klonopin  Hyponatremia. Due to liver cirrhosis. Chronic and stable. Follow-up BMP as outpatient. Morbid obesity. Generalized weakness. PT evaluation: home health and PT.  I discussed with Dr. Marius Ditch. Per case manager, the Bradley Dunlap need home health with RN. DISCHARGE CONDITIONS:  Stable, discharged to home with home health today. CONSULTS OBTAINED:  Treatment Team:  Lin Landsman, MD DRUG ALLERGIES:  No Known Allergies DISCHARGE MEDICATIONS:   Allergies as of 01/05/2017   No Known Allergies     Medication List    STOP taking these medications   lisinopril 20 MG tablet Commonly known as:  PRINIVIL,ZESTRIL   predniSONE 10 MG tablet Commonly known as:  DELTASONE     TAKE these medications   albuterol 108 (90 Base) MCG/ACT inhaler Commonly known as:  PROVENTIL HFA;VENTOLIN HFA Inhale 2 puffs into the lungs every 4 (four) hours as needed for wheezing.   albuterol (2.5 MG/3ML) 0.083% nebulizer solution Commonly known as:  PROVENTIL Take 3 mLs (2.5 mg total) by nebulization every 4 (four) hours as needed for wheezing.   ciprofloxacin 500 MG tablet Commonly known as:  CIPRO Take 1 tablet (500 mg total) by mouth daily with breakfast.   clonazePAM 0.5 MG tablet Commonly known as:  KLONOPIN Take 1 tablet (0.5 mg total) by mouth 2 (two) times daily as needed for anxiety.   Fluticasone-Salmeterol 250-50 MCG/DOSE Aepb Commonly known as:  ADVAIR DISKUS Inhale 1 puff into the lungs 2 (two) times daily.   furosemide 40 MG tablet Commonly known as:  LASIX Take 1 tablet (40 mg total) by mouth daily. What changed:  medication strength  how much to take  when to take this   nicotine 7 mg/24hr patch Commonly known as:  NICODERM CQ - dosed in mg/24 hr Place 1 patch (7 mg total) onto the skin  daily.   pantoprazole 40 MG tablet Commonly known as:  PROTONIX 40 mg po bid before meal for 2 weeks, then daily.   polyethylene glycol packet Commonly known as:  MIRALAX / GLYCOLAX Take 17 g by mouth daily as needed.   spironolactone 100 MG tablet Commonly known as:  ALDACTONE Take 1 tablet (100 mg total) by mouth daily. What changed:  medication strength  how much to take  when to take this   tiotropium 18 MCG inhalation capsule Commonly known as:  SPIRIVA HANDIHALER Place 1 capsule (18 mcg total) into inhaler and inhale daily.   traMADol 50 MG tablet Commonly known as:  ULTRAM Take 1 tablet (50 mg total) by mouth every 6 (six) hours as needed for moderate pain.            Discharge Care Instructions        Start     Ordered   01/06/17 0000  spironolactone (ALDACTONE) 100 MG tablet  Daily     01/05/17 1227   01/05/17 0000  Increase activity slowly     01/05/17 1015   01/05/17 0000  Diet - low sodium heart healthy     01/05/17 1015   01/05/17 0000  pantoprazole (PROTONIX) 40 MG tablet     01/05/17 1112   01/05/17 0000  ciprofloxacin (CIPRO) 500 MG tablet  Daily with breakfast     01/05/17 1227   01/05/17 0000  furosemide (LASIX) 40 MG tablet  Daily     01/05/17 1227       DISCHARGE INSTRUCTIONS:  See AVS.  If you experience worsening of your admission symptoms, develop shortness of breath, life threatening emergency, suicidal or homicidal thoughts you must seek medical attention immediately by calling 911 or calling your MD immediately  if symptoms less severe.  You Must read complete instructions/literature along with all the possible adverse reactions/side effects for all the Medicines you take and that have been prescribed to you. Take any new Medicines after you have completely understood and accpet all the possible adverse reactions/side effects.   Please note  You were cared for by a hospitalist during your hospital stay. If you have any  questions about your discharge medications or the care you received while you were in the hospital after you are discharged, you can call the unit and asked to speak with the hospitalist on call if the hospitalist that took care of you is not available. Once you are discharged, your primary care physician will handle any further medical issues. Please note that NO REFILLS for any discharge medications will be authorized once you are discharged, as it is imperative that you return to your primary care physician (or establish a relationship with a primary care physician if you do not have one) for your aftercare needs so that they can reassess your need for medications and monitor your lab values.    On the day of Discharge:  VITAL SIGNS:  Blood pressure 123/76, pulse 76, temperature 97.6 F (36.4 C), temperature source Oral, resp. rate 12, height 5\' 8"  (  1.727 m), weight 220 lb (99.8 kg), SpO2 99 %. PHYSICAL EXAMINATION:  GENERAL:  61 y.o.-year-old Bradley Dunlap lying in the bed with no acute distress. Morbid obesity. EYES: Pupils equal, round, reactive to light and accommodation. No scleral icterus. Extraocular muscles intact.  HEENT: Head atraumatic, normocephalic. Oropharynx and nasopharynx clear.  NECK:  Supple, no jugular venous distention. No thyroid enlargement, no tenderness.  LUNGS: Normal breath sounds bilaterally, no wheezing, rales,rhonchi or crepitation. No use of accessory muscles of respiration.  CARDIOVASCULAR: S1, S2 normal. No murmurs, rubs, or gallops.  ABDOMEN: Soft, non-tender, distended. Bowel sounds present. EXTREMITIES: No pedal edema, cyanosis, or clubbing.  NEUROLOGIC: Cranial nerves II through XII are intact. Muscle strength 5/5 in all extremities. Sensation intact. Gait not checked.  PSYCHIATRIC: The Bradley Dunlap is alert and oriented x 3.  SKIN: No obvious rash, lesion, or ulcer.  DATA REVIEW:   CBC  Recent Labs Lab 01/05/17 1100  WBC 9.5  HGB 12.6*  HCT 36.0*  PLT 105*      Chemistries   Recent Labs Lab 01/03/17 1233  01/05/17 0413  NA 128*  < > 128*  K 3.8  < > 3.8  CL 92*  < > 95*  CO2 26  < > 25  GLUCOSE 118*  < > 119*  BUN 17  < > 18  CREATININE 0.95  < > 0.85  CALCIUM 9.0  < > 8.4*  AST 59*  --   --   ALT 51  --   --   ALKPHOS 115  --   --   BILITOT 1.7*  --   --   < > = values in this interval not displayed.   Microbiology Results  Results for orders placed or performed during the hospital encounter of 01/03/17  Body fluid culture     Status: None (Preliminary result)   Collection Time: 01/04/17  8:35 AM  Result Value Ref Range Status   Specimen Description PERITONEAL  Final   Special Requests NONE  Final   Gram Stain   Final    RARE WBC PRESENT,BOTH PMN AND MONONUCLEAR NO ORGANISMS SEEN    Culture   Final    NO GROWTH < 24 HOURS Performed at [facility], [place] [address] [place], [place] [address]    Report Status PENDING  Incomplete    RADIOLOGY:  No results found.   Management plans discussed with the Bradley Dunlap, family and they are in agreement.  CODE STATUS: Prior   TOTAL TIME TAKING CARE OF THIS Bradley Dunlap: 38 minutes.    Demetrios Loll M.D on 01/05/2017 at 5:41 PM  Between 7am to 6pm - Pager - [phone]  After 6pm go to [email or web address] - Proofreader  [facility] [place] [facility]  Office  [phone]  CC: Primary care physician; Bradley Dunlap, No Pcp Per   Note: This dictation was prepared with Dragon dictation along with smaller phrase technology. Any transcriptional errors that result from this process are unintentional.

## 2017-01-06 LAB — LIPASE, FLUID: Lipase-Fluid: <3 U/L

## 2017-01-07 LAB — BODY FLUID CULTURE: CULTURE: NO GROWTH

## 2017-01-13 ENCOUNTER — Encounter: Payer: Self-pay | Admitting: *Deleted

## 2017-01-13 ENCOUNTER — Inpatient Hospital Stay
Admission: EM | Admit: 2017-01-13 | Discharge: 2017-01-14 | DRG: 433 | Disposition: A | Discharge status: Home Health | Payer: Medicaid Other | Attending: Internal Medicine | Admitting: Internal Medicine

## 2017-01-13 DIAGNOSIS — R859 Unspecified abnormal finding in specimens from digestive organs and abdominal cavity: Secondary | ICD-10-CM

## 2017-01-13 DIAGNOSIS — Z881 Allergy status to other antibiotic agents status: Secondary | ICD-10-CM | POA: Diagnosis not present

## 2017-01-13 DIAGNOSIS — I10 Essential (primary) hypertension: Secondary | ICD-10-CM | POA: Diagnosis present

## 2017-01-13 DIAGNOSIS — B182 Chronic viral hepatitis C: Secondary | ICD-10-CM | POA: Diagnosis present

## 2017-01-13 DIAGNOSIS — K7031 Alcoholic cirrhosis of liver with ascites: Secondary | ICD-10-CM | POA: Diagnosis present

## 2017-01-13 DIAGNOSIS — Z23 Encounter for immunization: Secondary | ICD-10-CM | POA: Diagnosis not present

## 2017-01-13 DIAGNOSIS — F17210 Nicotine dependence, cigarettes, uncomplicated: Secondary | ICD-10-CM | POA: Diagnosis present

## 2017-01-13 DIAGNOSIS — K659 Peritonitis, unspecified: Secondary | ICD-10-CM | POA: Diagnosis present

## 2017-01-13 DIAGNOSIS — Z79899 Other long term (current) drug therapy: Secondary | ICD-10-CM

## 2017-01-13 DIAGNOSIS — Z885 Allergy status to narcotic agent status: Secondary | ICD-10-CM | POA: Diagnosis not present

## 2017-01-13 DIAGNOSIS — F1010 Alcohol abuse, uncomplicated: Secondary | ICD-10-CM | POA: Diagnosis present

## 2017-01-13 DIAGNOSIS — Z8249 Family history of ischemic heart disease and other diseases of the circulatory system: Secondary | ICD-10-CM | POA: Diagnosis not present

## 2017-01-13 DIAGNOSIS — J449 Chronic obstructive pulmonary disease, unspecified: Secondary | ICD-10-CM | POA: Diagnosis present

## 2017-01-13 DIAGNOSIS — E871 Hypo-osmolality and hyponatremia: Secondary | ICD-10-CM | POA: Diagnosis present

## 2017-01-13 DIAGNOSIS — C220 Liver cell carcinoma: Secondary | ICD-10-CM | POA: Diagnosis present

## 2017-01-13 DIAGNOSIS — Z79891 Long term (current) use of opiate analgesic: Secondary | ICD-10-CM | POA: Diagnosis not present

## 2017-01-13 LAB — COMPREHENSIVE METABOLIC PANEL
ALK PHOS: 105 U/L (ref 38–126)
ALT: 54 U/L (ref 17–63)
ANION GAP: 7 (ref 5–15)
AST: 78 U/L — ABNORMAL HIGH (ref 15–41)
Albumin: 3 g/dL — ABNORMAL LOW (ref 3.5–5.0)
BUN: 12 mg/dL (ref 6–20)
CALCIUM: 9.1 mg/dL (ref 8.9–10.3)
CO2: 23 mmol/L (ref 22–32)
CREATININE: 1.01 mg/dL (ref 0.61–1.24)
Chloride: 100 mmol/L — ABNORMAL LOW (ref 101–111)
Glucose, Bld: 135 mg/dL — ABNORMAL HIGH (ref 65–99)
Potassium: 3.7 mmol/L (ref 3.5–5.1)
SODIUM: 130 mmol/L — AB (ref 135–145)
TOTAL PROTEIN: 6.8 g/dL (ref 6.5–8.1)
Total Bilirubin: 0.9 mg/dL (ref 0.3–1.2)

## 2017-01-13 LAB — CBC
HCT: 35.6 % — ABNORMAL LOW (ref 40.0–52.0)
HEMOGLOBIN: 12.6 g/dL — AB (ref 13.0–18.0)
MCH: 33 pg (ref 26.0–34.0)
MCHC: 35.4 g/dL (ref 32.0–36.0)
MCV: 93.1 fL (ref 80.0–100.0)
PLATELETS: 120 10*3/uL — AB (ref 150–440)
RBC: 3.82 MIL/uL — AB (ref 4.40–5.90)
RDW: 14 % (ref 11.5–14.5)
WBC: 7.5 10*3/uL (ref 3.8–10.6)

## 2017-01-13 LAB — LIPASE, BLOOD: Lipase: 28 U/L (ref 11–51)

## 2017-01-13 MED ORDER — PIPERACILLIN-TAZOBACTAM 3.375 G IVPB
3.3750 g | Freq: Three times a day (TID) | INTRAVENOUS | Status: DC
  Administered 2017-01-13: 10:00:00 3.375 g via INTRAVENOUS
  Filled 2017-01-13: qty 50

## 2017-01-13 MED ORDER — FUROSEMIDE 40 MG PO TABS
40.0000 mg | ORAL_TABLET | Freq: Every day | ORAL | Status: DC
  Administered 2017-01-13 – 2017-01-14 (×2): 40 mg via ORAL
  Filled 2017-01-13 (×2): qty 1

## 2017-01-13 MED ORDER — INFLUENZA VAC SPLIT QUAD 0.5 ML IM SUSY
0.5000 mL | PREFILLED_SYRINGE | INTRAMUSCULAR | Status: AC
  Administered 2017-01-14: 0.5 mL via INTRAMUSCULAR
  Filled 2017-01-13: qty 0.5

## 2017-01-13 MED ORDER — TRAMADOL HCL 50 MG PO TABS
50.0000 mg | ORAL_TABLET | Freq: Four times a day (QID) | ORAL | Status: DC | PRN
Start: 2017-01-13 — End: 2017-01-14
  Administered 2017-01-13 – 2017-01-14 (×3): 50 mg via ORAL
  Filled 2017-01-13 (×3): qty 1

## 2017-01-13 MED ORDER — POLYETHYLENE GLYCOL 3350 17 G PO PACK: 17.0000 g | PACK | Freq: Every day | ORAL | Status: DC | PRN

## 2017-01-13 MED ORDER — ONDANSETRON HCL 4 MG PO TABS: 4.0000 mg | ORAL_TABLET | Freq: Four times a day (QID) | ORAL | Status: DC | PRN

## 2017-01-13 MED ORDER — SODIUM CHLORIDE 0.9 % IV SOLN: 250.0000 mL | INTRAVENOUS | Status: DC | PRN

## 2017-01-13 MED ORDER — SODIUM CHLORIDE 0.9% FLUSH
3.0000 mL | Freq: Two times a day (BID) | INTRAVENOUS | Status: DC
  Administered 2017-01-13 – 2017-01-14 (×3): 3 mL via INTRAVENOUS

## 2017-01-13 MED ORDER — ACETAMINOPHEN 325 MG PO TABS
650.0000 mg | ORAL_TABLET | Freq: Three times a day (TID) | ORAL | Status: DC | PRN
  Administered 2017-01-13: 650 mg via ORAL
  Filled 2017-01-13: qty 2

## 2017-01-13 MED ORDER — PIPERACILLIN-TAZOBACTAM 3.375 G IVPB 30 MIN
INTRAVENOUS | Status: AC
  Filled 2017-01-13: qty 50

## 2017-01-13 MED ORDER — ENSURE ENLIVE PO LIQD
237.0000 mL | Freq: Two times a day (BID) | ORAL | Status: DC
  Administered 2017-01-13 – 2017-01-14 (×2): 237 mL via ORAL

## 2017-01-13 MED ORDER — IBUPROFEN 600 MG PO TABS
ORAL_TABLET | ORAL | Status: AC
  Filled 2017-01-13: qty 1

## 2017-01-13 MED ORDER — MOMETASONE FURO-FORMOTEROL FUM 200-5 MCG/ACT IN AERO
2.0000 | INHALATION_SPRAY | Freq: Two times a day (BID) | RESPIRATORY_TRACT | Status: DC
  Administered 2017-01-13 – 2017-01-14 (×3): 2 via RESPIRATORY_TRACT
  Filled 2017-01-13: qty 8.8

## 2017-01-13 MED ORDER — PANTOPRAZOLE SODIUM 40 MG PO TBEC
40.0000 mg | DELAYED_RELEASE_TABLET | Freq: Every day | ORAL | Status: DC
  Administered 2017-01-13 – 2017-01-14 (×2): 40 mg via ORAL
  Filled 2017-01-13 (×2): qty 1

## 2017-01-13 MED ORDER — IBUPROFEN 600 MG PO TABS
600.0000 mg | ORAL_TABLET | Freq: Once | ORAL | Status: AC
  Administered 2017-01-13: 600 mg via ORAL

## 2017-01-13 MED ORDER — VANCOMYCIN HCL 10 G IV SOLR
1250.0000 mg | Freq: Two times a day (BID) | INTRAVENOUS | Status: DC
  Administered 2017-01-13 – 2017-01-14 (×3): 1250 mg via INTRAVENOUS
  Filled 2017-01-13 (×4): qty 1250

## 2017-01-13 MED ORDER — ONDANSETRON HCL 4 MG/2ML IJ SOLN: 4.0000 mg | Freq: Four times a day (QID) | INTRAMUSCULAR | Status: DC | PRN

## 2017-01-13 MED ORDER — SPIRONOLACTONE 100 MG PO TABS
100.0000 mg | ORAL_TABLET | Freq: Every day | ORAL | Status: DC
  Administered 2017-01-13 – 2017-01-14 (×2): 100 mg via ORAL
  Filled 2017-01-13 (×2): qty 4
  Filled 2017-01-13 (×2): qty 1

## 2017-01-13 MED ORDER — SODIUM CHLORIDE 0.9% FLUSH: 3.0000 mL | INTRAVENOUS | Status: DC | PRN

## 2017-01-13 MED ORDER — ALBUTEROL SULFATE HFA 108 (90 BASE) MCG/ACT IN AERS: 2.0000 | INHALATION_SPRAY | RESPIRATORY_TRACT | Status: DC | PRN

## 2017-01-13 MED ORDER — VANCOMYCIN HCL IN DEXTROSE 1-5 GM/200ML-% IV SOLN
1000.0000 mg | Freq: Once | INTRAVENOUS | Status: AC
  Administered 2017-01-13: 1000 mg via INTRAVENOUS
  Filled 2017-01-13: qty 200

## 2017-01-13 MED ORDER — ALBUTEROL SULFATE (2.5 MG/3ML) 0.083% IN NEBU: 2.5000 mg | INHALATION_SOLUTION | RESPIRATORY_TRACT | Status: DC | PRN

## 2017-01-13 MED ORDER — SODIUM CHLORIDE 0.9 % IV SOLN
3.0000 g | Freq: Four times a day (QID) | INTRAVENOUS | Status: DC
  Administered 2017-01-13 – 2017-01-14 (×3): 3 g via INTRAVENOUS
  Filled 2017-01-13 (×6): qty 3

## 2017-01-13 MED ORDER — TIOTROPIUM BROMIDE MONOHYDRATE 18 MCG IN CAPS
18.0000 ug | ORAL_CAPSULE | Freq: Every day | RESPIRATORY_TRACT | Status: DC
  Administered 2017-01-13 – 2017-01-14 (×2): 18 ug via RESPIRATORY_TRACT
  Filled 2017-01-13: qty 5

## 2017-01-13 MED ORDER — PIPERACILLIN-TAZOBACTAM 3.375 G IVPB
3.3750 g | Freq: Once | INTRAVENOUS | Status: AC
  Administered 2017-01-13: 3.375 g via INTRAVENOUS

## 2017-01-13 MED ORDER — ADULT MULTIVITAMIN W/MINERALS CH
1.0000 | ORAL_TABLET | Freq: Every day | ORAL | Status: DC
  Administered 2017-01-13 – 2017-01-14 (×2): 1 via ORAL
  Filled 2017-01-13 (×2): qty 1

## 2017-01-13 MED ORDER — CLONAZEPAM 0.5 MG PO TABS
0.5000 mg | ORAL_TABLET | Freq: Two times a day (BID) | ORAL | Status: DC | PRN
  Administered 2017-01-13 – 2017-01-14 (×3): 0.5 mg via ORAL
  Filled 2017-01-13 (×3): qty 1

## 2017-01-13 MED ORDER — MORPHINE SULFATE (PF) 4 MG/ML IV SOLN
4.0000 mg | Freq: Once | INTRAVENOUS | Status: AC
  Administered 2017-01-13: 4 mg via INTRAVENOUS
  Filled 2017-01-13: qty 1

## 2017-01-13 NOTE — Progress Notes (Addendum)
Pharmacy Antibiotic Note  Bradley Dunlap is a 61 y.o. male admitted on 01/13/2017 with peritonitis.  Pharmacy has been consulted for vancomycin and Zosyn dosing.  Plan: DW 61kg  Vd 57L kei 0.078 hr-1  T1/2 9 hours Vancomycin 1250 mg q 12 hours ordered with stacked dosing. Level before 5th dose. Goal trough 15-20.  Zosyn 3.375 grams q 8 hours ordered.  Height: 5\' 8"  (172.7 cm) Weight: 220 lb (99.8 kg) IBW/kg (Calculated) : 68.4  Temp (24hrs), Avg:98 F (36.7 C), Min:97.8 F (36.6 C), Max:98.1 F (36.7 C)   Recent Labs Lab 01/13/17 0117  WBC 7.5  CREATININE 1.01    Estimated Creatinine Clearance: 89.1 mL/min (by C-G formula based on SCr of 1.01 mg/dL).    Allergies  Allergen Reactions  . Codeine     Other reaction(s): Other (See Comments)  . Indomethacin     Other reaction(s): ITCHING    Antimicrobials this admission: Vancomycin, Zosyn 9/19 >>    >>   Dose adjustments this admission:   Microbiology results: 9/10 peritoneal fluid:  NGx3 days Paracentesis at [facility] showed [place]    Thank you for allowing pharmacy to be a part of this patient's care.  Patty Lopezgarcia S 01/13/2017 5:30 AM

## 2017-01-13 NOTE — Consult Note (Signed)
Bradley Darby, MD [address]  [place]  [place], [place] [address]  Main: [phone]  Fax: [phone] Pager: [phone]   Consultation  Referring Provider:     No ref. provider found Primary Care Physician:  Ellene Route Primary Gastroenterologist:  Dr. Gustavo Lah      Reason for Consultation:     Peritonitis  Date of Admission:  01/13/2017 Date of Consultation:  01/13/2017         HPI:   Bradley Dunlap is a 61 y.o. male with decompensated alcohol 8 and had CV cirrhosis, ascites requiring frequent therapeutic paracentesis, variceal bleed status post EGD with esophageal variceal ligation on 01/04/2017, and new diagnosis of [place] based on imaging on 01/04/2017. He had a recent admission with abdominal distention and upper GI bleed and diagnosis of SBP. He was discharged home on ciprofloxacin 500 mg twice daily. His ascites fluid cultures did not show any growth from last admission. He went to [facility] yesterday due to abdominal distention, underwent therapeutic paracentesis, 3 L of fluid was removed. He received a phone call from [facility] last night reporting that the cultures are growing bacteria and he was recommended to either go to [facility] or [facility] to get admitted for antibiotics. He got electively admitted today. Per report, ascitic fluid is positive for gram-positive cocci. We don't have the final culture results. Apparently, the total cell count was 100, percent neutrophils were 12. These members are significantly better compared to the prior ascites fluid analysis from last admission at [facility]. His hemoglobin has been stable since last admission.  He reports doing well. Denies any abdominal pain, melena, hematemesis, CGE. He denies f/c/n/v. He denies worsening abdominal distension. He is started on IV zosyn and IV Vanc.  He missed appointment with his primary gastroenterologist at [facility] clinic yesterday. I spoke to his nurse practitioner, Ms. Jacqulyn Liner who  referred him to do a liver transplant center. GI Procedures: 01/04/2017 Severe esophageal varices with variceal bleed s/p ligation x 2 Moderate to severe portal gastropathy, no PUD  Past Medical History:  Diagnosis Date  . Alcohol use   . Ascites   . COPD (chronic obstructive pulmonary disease) ([place])   . Hepatitis C   . Hypertension   . Liver cirrhosis ([place])   . Tobacco use     Past Surgical History:  Procedure Laterality Date  . BACK SURGERY    . ESOPHAGOGASTRODUODENOSCOPY N/A 01/04/2017   Procedure: ESOPHAGOGASTRODUODENOSCOPY (EGD);  Surgeon: Lin Landsman, MD;  Location: [facility] ENDOSCOPY;  Service: Gastroenterology;  Laterality: N/A;    Prior to Admission medications   Medication Sig Start Date End Date Taking? Authorizing Provider  albuterol (PROVENTIL HFA;VENTOLIN HFA) 108 (90 Base) MCG/ACT inhaler Inhale 2 puffs into the lungs every 4 (four) hours as needed for wheezing. 12/27/16  Yes Wieting, Richard, MD  albuterol (PROVENTIL) (2.5 MG/3ML) 0.083% nebulizer solution Take 3 mLs (2.5 mg total) by nebulization every 4 (four) hours as needed for wheezing. 12/27/16  Yes Loletha Grayer, MD  ciprofloxacin (CIPRO) 500 MG tablet Take 1 tablet (500 mg total) by mouth daily with breakfast. 01/05/17  Yes Demetrios Loll, MD  clonazePAM (KLONOPIN) 0.5 MG tablet Take 1 tablet (0.5 mg total) by mouth 2 (two) times daily as needed for anxiety. 12/27/16  Yes Wieting, Richard, MD  Fluticasone-Salmeterol (ADVAIR DISKUS) 250-50 MCG/DOSE AEPB Inhale 1 puff into the lungs 2 (two) times daily. 12/27/16  Yes Loletha Grayer, MD  furosemide (LASIX) 40 MG tablet Take  1 tablet (40 mg total) by mouth daily. 01/05/17 01/05/18 Yes Demetrios Loll, MD  nicotine (NICODERM CQ - DOSED IN MG/24 HR) 7 mg/24hr patch Place 1 patch (7 mg total) onto the skin daily. 12/27/16  Yes Wieting, Richard, MD  pantoprazole (PROTONIX) 40 MG tablet 40 mg po bid before meal for 2 weeks, then daily. 01/05/17  Yes Demetrios Loll, MD  polyethylene glycol  [facility] / Floria Raveling) packet Take 17 g by mouth daily as needed. 12/27/16  Yes Wieting, Richard, MD  spironolactone (ALDACTONE) 25 MG tablet Take 25 mg by mouth 2 (two) times daily. 12/28/16  Yes [provider]  traMADol (ULTRAM) 50 MG tablet Take 1 tablet (50 mg total) by mouth every 6 (six) hours as needed for moderate pain. 12/27/16  Yes Wieting, Richard, MD  tiotropium (SPIRIVA HANDIHALER) 18 MCG inhalation capsule Place 1 capsule (18 mcg total) into inhaler and inhale daily. Patient not taking: Reported on 01/03/2017 12/27/16 12/27/17  Loletha Grayer, MD    Family History  Problem Relation Age of Onset  . Cancer Mother   . Heart attack Father      Social History  Substance Use Topics  . Smoking status: Current Every Day Smoker    Packs/day: 0.50    Types: Cigarettes  . Smokeless tobacco: Former Systems developer  . Alcohol use 1.8 oz/week    3 Cans of beer per week     Comment: 2-3 months    Allergies as of 01/13/2017 - Review Complete 01/13/2017  Allergen Reaction Noted  . Codeine  10/26/2012  . Indomethacin  10/26/2012    Review of Systems:    All systems reviewed and negative except where noted in HPI.   Physical Exam:  Vital signs in last 24 hours: Temp:  [97.8 F (36.6 C)-98.1 F (36.7 C)] 98 F (36.7 C) (09/19 0915) Pulse Rate:  [77-127] 104 (09/19 0915) Resp:  [11-22] 18 (09/19 0915) BP: (83-126)/(54-93) 108/61 (09/19 0915) SpO2:  [93 %-99 %] 97 % (09/19 0915) Weight:  [216 lb 4.8 oz (98.1 kg)-220 lb (99.8 kg)] 216 lb 4.8 oz (98.1 kg) (09/19 1424) Last BM Date: 01/12/17 General:   Pleasant, cooperative in NAD Head:  Normocephalic and atraumatic. Eyes:   No icterus.   Conjunctiva pink. PERRLA. Ears:  Normal auditory acuity. Neck:  Supple; no masses or thyroidomegaly Lungs: Respirations even and unlabored. Lungs clear to auscultation bilaterally.   No wheezes, crackles, or rhonchi.  Heart:  Regular rate and rhythm;  Without murmur, clicks, rubs or gallops Abdomen:   Soft, mildly distended, nontender. Normal bowel sounds. No appreciable masses or hepatomegaly.  No rebound or guarding.  Rectal:  Not performed. Msk:  Symmetrical without gross deformities.  Strength normal  Extremities:  Without edema, cyanosis or clubbing. Neurologic:  Alert and oriented x3;  grossly normal neurologically. Skin:  Intact without significant lesions or rashes. Cervical Nodes:  No significant cervical adenopathy. Psych:  Alert and cooperative. Normal affect.  LAB RESULTS:  Recent Labs  01/13/17 0117  WBC 7.5  HGB 12.6*  HCT 35.6*  PLT 120*   BMET  Recent Labs  01/13/17 0117  NA 130*  K 3.7  CL 100*  CO2 23  GLUCOSE 135*  BUN 12  CREATININE 1.01  CALCIUM 9.1   LFT  Recent Labs  01/13/17 0117  PROT 6.8  ALBUMIN 3.0*  AST 78*  ALT 54  ALKPHOS 105  BILITOT 0.9   PT/INR No results for input(s): LABPROT, INR in the last 72 hours.  STUDIES: No results found.    Impression / Plan:   Bradley Dunlap is a 61 y.o. 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 He is electively admitted secondary to positive ascites fluid cultures on 01/11/2017 from [facility]. Apparently, his ascites fluid analysis revealed improving total cell count and an percent neutrophils compared to prior to admission. Gram-positive cocci on the recent cultures could be contaminant. Recommend ID consult for further recommendations. Can continue Zosyn and vancomycin for now. There is no evidence of active GI bleed. Continue PPI twice a day  - Recommend repeat EGD for variceal ligation in 2-3 weeks - Outpatient follow-up for liver transplant evaluation with new diagnosis of hepatocellular carcinoma  Thank you for involving me in the care of this patient.      LOS: 0 days    Sherri Sear, MD  01/13/2017, 3:45 PM   Note: This dictation was prepared with Dragon dictation along with smaller phrase technology. Any transcriptional errors that result from this process are unintentional.

## 2017-01-13 NOTE — Progress Notes (Signed)
Initial Nutrition Assessment  DOCUMENTATION CODES:   Not applicable  INTERVENTION:   Pt at high risk for deficiencies of thiamine, B-12, folate, zinc as well as vitamins A,D,E, and K.   Ensure Enlive po BID, each supplement provides 350 kcal and 20 grams of protein  Magic cup TID with meals, each supplement provides 290 kcal and 9 grams of protein  MVI  Change diet to 2 gram sodium restriction  NUTRITION DIAGNOSIS:   Increased nutrient needs related to chronic illness (Cirrhosis, hepatic carcinoma, COPD) as evidenced by increased estimated needs from protein.  GOAL:   Patient will meet greater than or equal to 90% of their needs  MONITOR:   PO intake, Supplement acceptance, Labs, Weight trends, I & O's  REASON FOR ASSESSMENT:   Malnutrition Screening Tool    ASSESSMENT:   61 year old male patient with history of etoh abuse, hepatitis C, cirrhosis of liver, SIADH, emphysema, esophageal varices s/p banding, hypertension, COPD presented to the emergency room with abdominal tightness and chills. He had ascitic fluid drawn and cultured yesterday at [facility] which grew gram-positive cocci.  S/p EGD 01/04/17 which showed large esophageal varices with stigmata of recent bleeding and one actively bleeding esophageal varix in mid esophagus, bleeding controlled after variceal ligation. Moderate portal HTN gastropathy. No evidence of ulcers. No recurrence of variceal bleed since. 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, lesions x 2, 78m and 139min size with local or distant metastasis. Pt s/p paracentesis at that time; 5.4L fluid removed.   Met with pt in room today. Pt reports intermittent poor appetite pta. Pt reports that he often eats only one meal per day. Pt's meals consist mainly of potted meats and canned foods. Pt reports that his appetite has improved tremendously since his paracentesis. Pt does not get full as quick now. Pt is currently eating 100% of his meals. Pt is willing  to drink supplements but reports that he is unable to afford them at home (RD provided coupons today). Per chart, pt has lost 18lbs(8%) in 5 months; RD is unsure how much of this is true weight loss r/t pt with severe ascites but pt reports that he can tell he is losing muscle. Pt was educated on sodium intake today; RD discussed foods to avoid and encouraged intake of protein. Pt reports that he has not not any alcohol in 6 weeks; RD discouraged intake of alcohol.    Medications reviewed and include: Lasix, protonix, aldactone, unasyn, vancomycin  Labs reviewed: Na 130(L), Cl 100(L), alb 3.0(L), AST 78(H)  Nutrition-Focused physical exam completed. Findings are mild fat depletion in orbital regions, no muscle depletion, and mild edema in BLE.   Diet Order:  Diet 2 gram sodium Room service appropriate? Yes; Fluid consistency: Thin  Skin:  Reviewed, no issues  Last BM:  9/18  Height:   Ht Readings from Last 1 Encounters:  01/13/17 _0  (1.727 m)    Weight:   Wt Readings from Last 1 Encounters:  01/13/17 216 lb 4.8 oz (98.1 kg)    Ideal Body Weight:  70 kg  BMI:  Body mass index is 32.89 kg/m.  Estimated Nutritional Needs:   Kcal:  2150-2450kcal/day   Protein:  120-140g/day   Fluid:  2L/day or per MD  EDUCATION NEEDS:   Education needs addressed  CaKoleen DistanceS, RD, LDN Pager #- [phone] Hours Pager: [phone]

## 2017-01-13 NOTE — Progress Notes (Signed)
ANTIBIOTIC CONSULT NOTE - INITIAL  Pharmacy Consult for Unasyn Indication: intra-abdominal infection  Allergies  Allergen Reactions  . Codeine     Other reaction(s): Other (See Comments)  . Indomethacin     Other reaction(s): ITCHING    Patient Measurements: Height: 5\' 8"  (172.7 cm) Weight: 220 lb (99.8 kg) IBW/kg (Calculated) : 68.4 Adjusted Body Weight:   Vital Signs: Temp: 98 F (36.7 C) (09/19 0915) Temp Source: Oral (09/19 0915) BP: 108/61 (09/19 0915) Pulse Rate: 104 (09/19 0915) Intake/Output from previous day: No intake/output data recorded. Intake/Output from this shift: Total I/O In: 540 [P.O.:240; IV Piggyback:300] Out: -   Labs:  Recent Labs  01/13/17 0117  WBC 7.5  HGB 12.6*  PLT 120*  CREATININE 1.01   Estimated Creatinine Clearance: 89.1 mL/min (by C-G formula based on SCr of 1.01 mg/dL). No results for input(s): VANCOTROUGH, VANCOPEAK, VANCORANDOM, GENTTROUGH, GENTPEAK, GENTRANDOM, TOBRATROUGH, TOBRAPEAK, TOBRARND, AMIKACINPEAK, AMIKACINTROU, AMIKACIN in the last 72 hours.   Microbiology: Recent Results (from the past 720 hour(s))  Body fluid culture     Status: None   Collection Time: 12/24/16 10:18 AM  Result Value Ref Range Status   Specimen Description PERITONEAL  Final   Special Requests NONE  Final   Gram Stain   Final    ABUNDANT WBC PRESENT, PREDOMINANTLY MONONUCLEAR NO ORGANISMS SEEN    Culture   Final    NO GROWTH 3 DAYS Performed at [facility], [address], [place], [place] [address]    Report Status 12/28/2016 FINAL  Final  Body fluid culture     Status: None   Collection Time: 12/30/16  1:05 PM  Result Value Ref Range Status   Specimen Description PERITONEAL  Final   Special Requests PERITONEAL  Final   Gram Stain   Final    MODERATE WBC PRESENT,BOTH PMN AND MONONUCLEAR NO ORGANISMS SEEN    Culture   Final    NO GROWTH 3 DAYS Performed at [facility], [address], [place], [place] [address]    Report Status 01/02/2017 FINAL  Final  Body fluid culture     Status: None   Collection Time: 01/04/17  8:35 AM  Result Value Ref Range Status   Specimen Description PERITONEAL  Final   Special Requests NONE  Final   Gram Stain   Final    RARE WBC PRESENT,BOTH PMN AND MONONUCLEAR NO ORGANISMS SEEN    Culture   Final    NO GROWTH 3 DAYS Performed at [facility], [place] [address], [place], [place] [address]    Report Status 01/07/2017 FINAL  Final    Medical History: Past Medical History:  Diagnosis Date  . Alcohol use   . Ascites   . COPD (chronic obstructive pulmonary disease) ([place])   . Hepatitis C   . Hypertension   . Liver cirrhosis ([place])   . Tobacco use     Medications:  Prescriptions Prior to Admission  Medication Sig Dispense Refill Last Dose  . albuterol (PROVENTIL HFA;VENTOLIN HFA) 108 (90 Base) MCG/ACT inhaler Inhale 2 puffs into the lungs every 4 (four) hours as needed for wheezing. 1 Inhaler 0 Past Week at Unknown time  . albuterol (PROVENTIL) (2.5 MG/3ML) 0.083% nebulizer solution Take 3 mLs (2.5 mg total) by nebulization every 4 (four) hours as needed for wheezing. 100 vial 0 Past Week at Unknown time  . ciprofloxacin (CIPRO) 500 MG tablet Take 1 tablet (500 mg total) by mouth daily  with breakfast. 14 tablet 0 01/12/2017 at Unknown time  . clonazePAM (KLONOPIN) 0.5 MG tablet Take 1 tablet (0.5 mg total) by mouth 2 (two) times daily as needed for anxiety. 10 tablet 0 01/12/2017 at Unknown time  . Fluticasone-Salmeterol (ADVAIR DISKUS) 250-50 MCG/DOSE AEPB Inhale 1 puff into the lungs 2 (two) times daily. 60 each 0 01/12/2017 at Unknown time  . furosemide (LASIX) 40 MG tablet Take 1 tablet (40 mg total) by mouth daily. 30 tablet 1 01/12/2017 at Unknown time  . nicotine (NICODERM CQ - DOSED IN MG/24 HR) 7 mg/24hr patch Place 1 patch (7 mg total) onto the skin daily. 28 patch 0 Unknown at Unknown  . pantoprazole (PROTONIX) 40 MG tablet 40 mg po bid before meal for 2  weeks, then daily. 60 tablet 0 01/12/2017 at Unknown time  . polyethylene glycol (MIRALAX / GLYCOLAX) packet Take 17 g by mouth daily as needed. 30 each 0 prn at prn  . spironolactone (ALDACTONE) 25 MG tablet Take 25 mg by mouth 2 (two) times daily.  0 01/12/2017 at Unknown time  . traMADol (ULTRAM) 50 MG tablet Take 1 tablet (50 mg total) by mouth every 6 (six) hours as needed for moderate pain. 20 tablet 0 prn at prn  . tiotropium (SPIRIVA HANDIHALER) 18 MCG inhalation capsule Place 1 capsule (18 mcg total) into inhaler and inhale daily. (Patient not taking: Reported on 01/03/2017) 30 capsule 0 Not Taking at Unknown time   Scheduled:  . furosemide  40 mg Oral Daily  . [START ON 01/14/2017] Influenza vac split quadrivalent PF  0.5 mL Intramuscular Tomorrow-1000  . mometasone-formoterol  2 puff Inhalation BID  . pantoprazole  40 mg Oral Daily  . sodium chloride flush  3 mL Intravenous Q12H  . spironolactone  100 mg Oral Daily  . tiotropium  18 mcg Inhalation Daily   Assessment: Pharmacy consulted to dose and monitor unasyn in this 61 year old male with suspected peritonitis. Patient originally started on Zosyn and Vancomycin; however MD Wieting would like to de-escalate VZosyn to Unasyn  Goal of Therapy:    Plan:  Will start Unasyn 3 g IV q6 hours   Bradley Dunlap D 01/13/2017,1:59 PM

## 2017-01-13 NOTE — ED Notes (Signed)
Report off to kala rn 

## 2017-01-13 NOTE — ED Triage Notes (Signed)
Pt brought in via ems from home.  Pt reports he had fluid drawn off abd yesterday and today [facility] called today and told pt to go to hospital for eval of bacteria.  Pt alert.  Pt drinking water on arrival.

## 2017-01-13 NOTE — ED Notes (Addendum)
Pt reports [facility] called him today and told him to come to the hospital for eval of bacteria.  Pt states he had fluid drawn off his abdomen yesterday.  Pt dx with liver cancer and copd.  Pt denies etoh use today.  pt drinking water on arrival to er.  Sinus tach on monitor.

## 2017-01-13 NOTE — ED Notes (Signed)
Helped pt to bathroom. 

## 2017-01-13 NOTE — ED Notes (Signed)
meds given for a headache.   Pt alert  Speech clear.

## 2017-01-13 NOTE — Progress Notes (Signed)
Patient ID: Bradley Dunlap, male   DOB: May 02, 1955, 61 y.o.   MRN: [id number]  [employer] PROGRESS NOTE  Bradley Dunlap LPF:[id number] DOB: 06-29-1955 DOA: 01/13/2017 PCP: Ellene Route  HPI/Subjective: Patient was told to come to the hospital by Bradley Dunlap because of gram-positive cocci in his paracentesis culture. The patient's bruits has some abdominal pain and back pain. He states it's hard to tell the difference.  Objective: Vitals:   01/13/17 0606 01/13/17 0915  BP: 119/64 108/61  Pulse: 77 (!) 104  Resp: 18 18  Temp: 97.8 F (36.6 C) 98 F (36.7 C)  SpO2: 99% 97%    Filed Weights   01/13/17 0118  Weight: 99.8 kg (220 lb)    ROS: Review of Systems  Constitutional: Negative for chills and fever.  Eyes: Negative for blurred vision.  Respiratory: Negative for cough and shortness of breath.   Cardiovascular: Negative for chest pain.  Gastrointestinal: Positive for abdominal pain. Negative for constipation, diarrhea, nausea and vomiting.  Genitourinary: Negative for dysuria.  Musculoskeletal: Positive for back pain. Negative for joint pain.  Neurological: Negative for dizziness and headaches.   Exam: Physical Exam  Constitutional: He is oriented to person, place, and time.  HENT:  Nose: No mucosal edema.  Mouth/Throat: No oropharyngeal exudate or posterior oropharyngeal edema.  Eyes: Pupils are equal, round, and reactive to light. Conjunctivae, EOM and lids are normal.  Neck: No JVD present. Carotid bruit is not present. No edema present. No thyroid mass and no thyromegaly present.  Cardiovascular: S1 normal and S2 normal.  Exam reveals no gallop.   No murmur heard. Pulses:      Dorsalis pedis pulses are 2+ on the right side, and 2+ on the left side.  Respiratory: No respiratory distress. He has wheezes in the right middle field, the right lower field, the left middle field and the left lower field. He has no rhonchi. He has no rales.  GI: Soft. Bowel sounds are  normal. He exhibits distension and fluid wave. There is no tenderness.  Musculoskeletal:       Right ankle: He exhibits no swelling.       Left ankle: He exhibits no swelling.  Lymphadenopathy:    He has no cervical adenopathy.  Neurological: He is alert and oriented to person, place, and time. No cranial nerve deficit.  Skin: Skin is warm. No rash noted. Nails show no clubbing.  Psychiatric: He has a normal mood and affect.      Data Reviewed: Basic Metabolic Panel:  Recent Labs Dunlap 01/13/17 0117  NA 130*  K 3.7  CL 100*  CO2 23  GLUCOSE 135*  BUN 12  CREATININE 1.01  CALCIUM 9.1   Liver Function Tests:  Recent Labs Dunlap 01/13/17 0117  AST 78*  ALT 54  ALKPHOS 105  BILITOT 0.9  PROT 6.8  ALBUMIN 3.0*    Recent Labs Dunlap 01/13/17 0117  LIPASE 28   CBC:  Recent Labs Dunlap 01/13/17 0117  WBC 7.5  HGB 12.6*  HCT 35.6*  MCV 93.1  PLT 120*     Recent Results (from the past 240 hour(s))  Body fluid culture     Status: None   Collection Time: 01/04/17  8:35 AM  Result Value Ref Range Status   Specimen Description PERITONEAL  Final   Special Requests NONE  Final   Gram Stain   Final    RARE WBC PRESENT,BOTH PMN AND MONONUCLEAR NO ORGANISMS SEEN    Culture  Final    NO GROWTH 3 DAYS Performed at [facility]lap, [place] [address], [place], [place] [address]    Report Status 01/07/2017 FINAL  Final      Scheduled Meds: . furosemide  40 mg Oral Daily  . [START ON 01/14/2017] Influenza vac split quadrivalent PF  0.5 mL Intramuscular Tomorrow-1000  . mometasone-formoterol  2 puff Inhalation BID  . pantoprazole  40 mg Oral Daily  . sodium chloride flush  3 mL Intravenous Q12H  . spironolactone  100 mg Oral Daily  . tiotropium  18 mcg Inhalation Daily   Continuous Infusions: . sodium chloride    . piperacillin-tazobactam (ZOSYN)  IV 3.375 g (01/13/17 0950)  . vancomycin 1,250 mg (01/13/17 1058)    Assessment/Plan:  1. Peritonitis as per Bradley [facility]lap  laboratory data. Unfortunately I'm unable to see this in the care everywhere section of Bradley Dunlap. Still waiting full cultures. Could be contamination. Empiric antibiotics with vancomycin and change Zosyn over to Unasyn. 2. Recent GI bleed with esophageal varices status post banding. Case discussed with GI that won't need a endoscopy at this point. 3. Recent CT scan showing a 16 mm hypervascular lesion as per the radiological description could be suggestive of hepatocellular carcinoma. Case discussed with gastroenterologist and patient will need referral to tertiary care center for follow-up of this. 4. Alcoholic cirrhosis with ascites. Continue spironolactone and Lasix.  5. COPD. Continue Spiriva, dulera.  6. 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  Code Status:     Code Status Orders        Start     Ordered   01/13/17 1700  Full code  Continuous     01/13/17 0632    Code Status History    Date Active Date Inactive Code Status Order ID Comments User Context   01/03/2017  5:46 PM 01/05/2017  3:47 PM Full Code [id number]  Gladstone Lighter, MD Inpatient   12/24/2016  1:37 PM 12/27/2016  6:34 PM Full Code [id number]  Fritzi Mandes, MD Inpatient     Disposition Plan: Will need to follow up cultures prior to making a decision upon when to go home. Will need follow-up at tertiary care center.  Consultants:  Case discussed with gastroenterology  Antibiotics:  Change antibiotics to Unasyn  Vancomycin  Time spent: 35 minutes  Bradley Dunlap, Bradley Dunlap

## 2017-01-13 NOTE — H&P (Signed)
[facility] NAME: Gurinder Toral    MR#:  [id number]  DATE OF BIRTH:  [birth year]  DATE OF ADMISSION:  01/13/2017  PRIMARY CARE PHYSICIAN: Ellene Route   REQUESTING/REFERRING PHYSICIAN:   CHIEF COMPLAINT:   Chief Complaint  Patient presents with  . Abnormal Lab    HISTORY OF PRESENT ILLNESS: Dayshaun Whobrey  is a 61 y.o. male with a known history of Cirrhosis of liver, hepatitis C, hypertension, tobacco abuse, ascites presented to the emergency room after he was asked to come by physicians at [facility]. Patient had paracentesis and acetic fluid was removed and sent for cultures yesterday at [facility]. Ascitic fluid culture grew gram-positive cocci. Patient was reached on telephone and advised to come to the emergency room. Has some abdominal tightness secondary to fluid. Patient also has some chills but no fever. Has generalized weakness. No complaints of any chest pain.  PAST MEDICAL HISTORY:   Past Medical History:  Diagnosis Date  . Alcohol use   . Ascites   . COPD (chronic obstructive pulmonary disease) ([place])   . Hepatitis C   . Hypertension   . Liver cirrhosis ([place])   . Tobacco use     PAST SURGICAL HISTORY: Past Surgical History:  Procedure Laterality Date  . BACK SURGERY    . ESOPHAGOGASTRODUODENOSCOPY N/A 01/04/2017   Procedure: ESOPHAGOGASTRODUODENOSCOPY (EGD);  Surgeon: Lin Landsman, MD;  Location: [facility] ENDOSCOPY;  Service: Gastroenterology;  Laterality: N/A;    SOCIAL HISTORY:  Social History  Substance Use Topics  . Smoking status: Current Every Day Smoker    Packs/day: 0.50    Types: Cigarettes  . Smokeless tobacco: Former Systems developer  . Alcohol use 1.8 oz/week    3 Cans of beer per week     Comment: 2-3 months    FAMILY HISTORY:  Family History  Problem Relation Age of Onset  . Cancer Mother   . Heart attack Father     DRUG ALLERGIES:  Allergies  Allergen Reactions  .  Codeine     Other reaction(s): Other (See Comments)  . Indomethacin     Other reaction(s): ITCHING    REVIEW OF SYSTEMS:   CONSTITUTIONAL: No fever, fatigue or weakness.  EYES: No blurred or double vision.  EARS, NOSE, AND THROAT: No tinnitus or ear pain.  RESPIRATORY: No cough, shortness of breath, wheezing or hemoptysis.  CARDIOVASCULAR: No chest pain, orthopnea, edema.  GASTROINTESTINAL: Has nausea, no vomiting, diarrhea   abdominal tightness.  GENITOURINARY: No dysuria, hematuria.  ENDOCRINE: No polyuria, nocturia,  HEMATOLOGY: No anemia, easy bruising or bleeding SKIN: No rash or lesion. MUSCULOSKELETAL: No joint pain or arthritis.   NEUROLOGIC: No tingling, numbness, weakness.  PSYCHIATRY: No anxiety or depression.   MEDICATIONS AT HOME:  Prior to Admission medications   Medication Sig Start Date End Date Taking? Authorizing Provider  albuterol (PROVENTIL HFA;VENTOLIN HFA) 108 (90 Base) MCG/ACT inhaler Inhale 2 puffs into the lungs every 4 (four) hours as needed for wheezing. 12/27/16  Yes Wieting, Richard, MD  albuterol (PROVENTIL) (2.5 MG/3ML) 0.083% nebulizer solution Take 3 mLs (2.5 mg total) by nebulization every 4 (four) hours as needed for wheezing. 12/27/16  Yes Loletha Grayer, MD  ciprofloxacin (CIPRO) 500 MG tablet Take 1 tablet (500 mg total) by mouth daily with breakfast. 01/05/17  Yes Demetrios Loll, MD  clonazePAM (KLONOPIN) 0.5 MG tablet Take 1 tablet (0.5 mg total) by mouth 2 (two) times daily as  needed for anxiety. 12/27/16  Yes Wieting, Richard, MD  Fluticasone-Salmeterol (ADVAIR DISKUS) 250-50 MCG/DOSE AEPB Inhale 1 puff into the lungs 2 (two) times daily. 12/27/16  Yes Wieting, Richard, MD  furosemide (LASIX) 40 MG tablet Take 1 tablet (40 mg total) by mouth daily. 01/05/17 01/05/18 Yes Demetrios Loll, MD  nicotine (NICODERM CQ - DOSED IN MG/24 HR) 7 mg/24hr patch Place 1 patch (7 mg total) onto the skin daily. 12/27/16  Yes Wieting, Richard, MD  pantoprazole (PROTONIX) 40 MG  tablet 40 mg po bid before meal for 2 weeks, then daily. 01/05/17  Yes Demetrios Loll, MD  polyethylene glycol [facility]ia Raveling) packet Take 17 g by mouth daily as needed. 12/27/16  Yes Wieting, Richard, MD  spironolactone (ALDACTONE) 25 MG tablet Take 25 mg by mouth 2 (two) times daily. 12/28/16  Yes [provider]  traMADol (ULTRAM) 50 MG tablet Take 1 tablet (50 mg total) by mouth every 6 (six) hours as needed for moderate pain. 12/27/16  Yes Wieting, Richard, MD  tiotropium (SPIRIVA HANDIHALER) 18 MCG inhalation capsule Place 1 capsule (18 mcg total) into inhaler and inhale daily. Patient not taking: Reported on 01/03/2017 12/27/16 12/27/17  Loletha Grayer, MD      PHYSICAL EXAMINATION:   VITAL SIGNS: Blood pressure 112/72, pulse 81, temperature 98.1 F (36.7 C), temperature source Oral, resp. rate 17, height 5\' 8"  (1.727 m), weight 99.8 kg (220 lb), SpO2 93 %.  GENERAL:  61 y.o.-year-old patient lying in the bed with no acute distress.  EYES: Pupils equal, round, reactive to light and accommodation. No scleral icterus. Extraocular muscles intact.  HEENT: Head atraumatic, normocephalic. Oropharynx and nasopharynx clear.  NECK:  Supple, no jugular venous distention. No thyroid enlargement, no tenderness.  LUNGS: Normal breath sounds bilaterally, no wheezing, rales,rhonchi or crepitation. No use of accessory muscles of respiration.  CARDIOVASCULAR: S1, S2 normal. No murmurs, rubs, or gallops.  ABDOMEN: Soft, mild tenderness around umbilicus, distended secondary to ascites. Bowel sounds present. No organomegaly or mass.  EXTREMITIES: No pedal edema, cyanosis, or clubbing.  NEUROLOGIC: Cranial nerves II through XII are intact. Muscle strength 5/5 in all extremities. Sensation intact. Gait not checked.  PSYCHIATRIC: The patient is alert and oriented x 3.  SKIN: No obvious rash, lesion, or ulcer.   LABORATORY PANEL:   CBC  Recent Labs Lab 01/13/17 0117  WBC 7.5  HGB 12.6*  HCT 35.6*   PLT 120*  MCV 93.1  MCH 33.0  MCHC 35.4  RDW 14.0   ------------------------------------------------------------------------------------------------------------------  Chemistries   Recent Labs Lab 01/13/17 0117  NA 130*  K 3.7  CL 100*  CO2 23  GLUCOSE 135*  BUN 12  CREATININE 1.01  CALCIUM 9.1  AST 78*  ALT 54  ALKPHOS 105  BILITOT 0.9   ------------------------------------------------------------------------------------------------------------------ estimated creatinine clearance is 89.1 mL/min (by C-G formula based on SCr of 1.01 mg/dL). ------------------------------------------------------------------------------------------------------------------ No results for input(s): TSH, T4TOTAL, T3FREE, THYROIDAB in the last 72 hours.  Invalid input(s): FREET3   Coagulation profile No results for input(s): INR, PROTIME in the last 168 hours. ------------------------------------------------------------------------------------------------------------------- No results for input(s): DDIMER in the last 72 hours. -------------------------------------------------------------------------------------------------------------------  Cardiac Enzymes No results for input(s): CKMB, TROPONINI, MYOGLOBIN in the last 168 hours.  Invalid input(s): CK ------------------------------------------------------------------------------------------------------------------ Invalid input(s): POCBNP  ---------------------------------------------------------------------------------------------------------------  Urinalysis    Component Value Date/Time   COLORURINE AMBER (A) 12/24/2016 1548   APPEARANCEUR CLEAR (A) 12/24/2016 1548   LABSPEC 1.014 12/24/2016 1548   PHURINE 6.0 12/24/2016 1548  GLUCOSEU NEGATIVE 12/24/2016 [place] 12/24/2016 [place] 12/24/2016 [place] 12/24/2016 1548   PROTEINUR NEGATIVE 12/24/2016 1548   NITRITE  NEGATIVE 12/24/2016 [place] 12/24/2016 1548     RADIOLOGY: No results found.  EKG: Orders placed or performed during the hospital encounter of 01/13/17  . EKG 12-Lead  . EKG 12-Lead    IMPRESSION AND PLAN: 61 year old male patient with history of hepatitis C, cirrhosis of liver, hypertension, COPD presented to the emergency room with abdominal tightness and chills. He had ascitic fluid drawn and cultured yesterday at [facility] which grew gram-positive cocci. Admitting diagnosis 1. Peritonitis 2. Ascitis 3. Cirrhosis of liver 4. Hepatitis C 5. Emphysema Treatment plan Follow-up with [facility] for the complete acetic fluid culture results Start patient on IV vancomycin and IV Zosyn antibiotic Continue all current medications DVT prophylaxis sequential compression devices to lower extremities All the records are reviewed and case discussed with ED provider. Management plans discussed with the patient, family and they are in agreement.  CODE STATUS:FULL CODE Code Status History    Date Active Date Inactive Code Status Order ID Comments User Context   01/03/2017  5:46 PM 01/05/2017  3:47 PM Full Code [id number]  Gladstone Lighter, MD Inpatient   12/24/2016  1:37 PM 12/27/2016  6:34 PM Full Code [id number]  Fritzi Mandes, MD Inpatient       TOTAL TIME TAKING CARE OF THIS PATIENT: 51 minutes.    Saundra Shelling M.D on 01/13/2017 at 5:27 AM  Between 7am to 6pm - Pager - [phone]  After 6pm go to [email or web address] - password [email or web address]  [facility]  Office  [phone]  CC: Primary care physician; Ellene Route

## 2017-01-14 ENCOUNTER — Ambulatory Visit: Payer: Medicaid Other | Admitting: Gastroenterology

## 2017-01-14 LAB — CBC
HEMATOCRIT: 31.9 % — AB (ref 40.0–52.0)
Hemoglobin: 11.4 g/dL — ABNORMAL LOW (ref 13.0–18.0)
MCH: 33.9 pg (ref 26.0–34.0)
MCHC: 35.7 g/dL (ref 32.0–36.0)
MCV: 94.9 fL (ref 80.0–100.0)
Platelets: 99 10*3/uL — ABNORMAL LOW (ref 150–440)
RBC: 3.36 MIL/uL — AB (ref 4.40–5.90)
RDW: 14.2 % (ref 11.5–14.5)
WBC: 5.1 10*3/uL (ref 3.8–10.6)

## 2017-01-14 LAB — COMPREHENSIVE METABOLIC PANEL
ALBUMIN: 2.8 g/dL — AB (ref 3.5–5.0)
ALT: 49 U/L (ref 17–63)
ANION GAP: 5 (ref 5–15)
AST: 70 U/L — ABNORMAL HIGH (ref 15–41)
Alkaline Phosphatase: 86 U/L (ref 38–126)
BILIRUBIN TOTAL: 0.7 mg/dL (ref 0.3–1.2)
BUN: 12 mg/dL (ref 6–20)
CHLORIDE: 96 mmol/L — AB (ref 101–111)
CO2: 26 mmol/L (ref 22–32)
Calcium: 8.9 mg/dL (ref 8.9–10.3)
Creatinine, Ser: 0.86 mg/dL (ref 0.61–1.24)
GFR calc Af Amer: >60 mL/min (ref >60)
Glucose, Bld: 101 mg/dL — ABNORMAL HIGH (ref 65–99)
POTASSIUM: 4.1 mmol/L (ref 3.5–5.1)
Sodium: 127 mmol/L — ABNORMAL LOW (ref 135–145)
TOTAL PROTEIN: 6.3 g/dL — AB (ref 6.5–8.1)

## 2017-01-14 MED ORDER — ALBUTEROL SULFATE HFA 108 (90 BASE) MCG/ACT IN AERS: 2.0000 | INHALATION_SPRAY | RESPIRATORY_TRACT | 0 refills | Status: AC | PRN

## 2017-01-14 MED ORDER — TIOTROPIUM BROMIDE MONOHYDRATE 18 MCG IN CAPS
18.0000 ug | ORAL_CAPSULE | Freq: Every day | RESPIRATORY_TRACT | 0 refills | Status: AC
Start: 2017-01-14 — End: 2018-01-21

## 2017-01-14 MED ORDER — FLUTICASONE-SALMETEROL 250-50 MCG/DOSE IN AEPB: 1.0000 | INHALATION_SPRAY | Freq: Two times a day (BID) | RESPIRATORY_TRACT | 0 refills | Status: AC

## 2017-01-14 MED ORDER — CLONAZEPAM 0.5 MG PO TABS: 0.5000 mg | ORAL_TABLET | Freq: Two times a day (BID) | ORAL | 0 refills | Status: DC | PRN

## 2017-01-14 MED ORDER — TRAMADOL HCL 50 MG PO TABS: 50.0000 mg | ORAL_TABLET | Freq: Four times a day (QID) | ORAL | 0 refills | Status: DC | PRN

## 2017-01-14 MED ORDER — ENSURE ENLIVE PO LIQD: 237.0000 mL | Freq: Two times a day (BID) | ORAL | 0 refills | Status: DC

## 2017-01-14 MED ORDER — NICOTINE 7 MG/24HR TD PT24: 7.0000 mg | MEDICATED_PATCH | Freq: Every day | TRANSDERMAL | 0 refills | Status: DC

## 2017-01-14 MED ORDER — SPIRONOLACTONE 100 MG PO TABS: 100.0000 mg | ORAL_TABLET | Freq: Every day | ORAL | 0 refills | Status: DC

## 2017-01-14 MED ORDER — CIPROFLOXACIN HCL 500 MG PO TABS: 500.0000 mg | ORAL_TABLET | Freq: Every day | ORAL | 0 refills | Status: DC

## 2017-01-14 NOTE — Care Management (Signed)
Patient was admitted for suspected peritonitis, which was ruled as likely a contaminant. Patient lives at home alone.  PCP [facility].  Pharmacy [facility].  Patient is open with [facility].  Resumption orders have been placed for RN, aide, and SW.  Patient was provided with a cane previous admission.  RNCM consult placed for transportation.  Patient utilizes Medicaid transportation and states he normally uses their "Lucianne Lei".  Patient's brother also provides transportation on occasion.  Patient states "I just have so much going on that I can't keep up with where I am supposed to be."  I have notified with [place] with [facility]  She is to notify the RN and SW to assist with this issue.  RNCM signing off.

## 2017-01-14 NOTE — Discharge Summary (Signed)
[place] [facility] [place] NAME: Bradley Dunlap    MR#:  [id number]  DATE OF BIRTH:  03-03-1956  DATE OF ADMISSION:  01/13/2017 ADMITTING PHYSICIAN: Saundra Shelling, MD  DATE OF DISCHARGE: 01/14/2017  PRIMARY CARE PHYSICIAN: Ellene Route    ADMISSION DIAGNOSIS:  irregular labs  DISCHARGE DIAGNOSIS:  Staph epidermidis in culture bottle. Likely skin contamination  SECONDARY DIAGNOSIS:   Past Medical History:  Diagnosis Date  . Alcohol use   . Ascites   . COPD (chronic obstructive pulmonary disease) ([place])   . Hepatitis C   . Hypertension   . Liver cirrhosis ([place])   . Tobacco use     HOSPITAL COURSE:   1. Suspected peritonitis. Patient was called by [facility] to go to the hospital as soon as possible. There was gram-positive cocci in the culture bottle from paracentesis. Today I got the result that it staph epidermidis. Likely this is a skin contamination. No need for further treatment since the patient is not having severe abdominal pain. Go back on empiric Cipro for SBP prophylaxis. 2. Recent GI bleed with esophageal varices banding. Case discussed with Donnelly Angelica gastroenterology and they will set up for an outpatient repeat endoscopy. I did not prescribe not alone because of the patient's wheezing. 3. Recent CT scan showing 16 mm hypervascular lesion as per the radiological description would be suggestive of hepatocellular carcinoma. Case discussed with Dr. Donnelly Angelica gastroenterology and they will refer to [facility] for treatment of this. 4. Alcoholic cirrhosis with ascites. Continue spironolactone 100 mg daily and Lasix 40 mg daily. Follow-up as outpatient 5. COPD with continued wheeze. Continue Advair and Spiriva and albuterol 6. Hyponatremia. This seems chronic for this patient with prior alcohol use. 7. Home health RN, aide and social work set up this patient since this is the third time he is been in the hospital and a short period  of time. 8. Case discussed with the patient's sister on the phone and she will pick up the patient and bring to the gastroenterologist office tomorrow morning.   DISCHARGE CONDITIONS:   [place]  CONSULTS OBTAINED:  Treatment Team:  Lin Landsman, MD  DRUG ALLERGIES:   Allergies  Allergen Reactions  . Codeine     Other reaction(s): Other (See Comments)  . Indomethacin     Other reaction(s): ITCHING    DISCHARGE MEDICATIONS:   Current Discharge Medication List    START taking these medications   Details  feeding supplement, ENSURE ENLIVE, (ENSURE ENLIVE) LIQD Take 237 mLs by mouth 2 (two) times daily between meals. Qty: 60 Bottle, Refills: 0      CONTINUE these medications which have CHANGED   Details  albuterol (PROVENTIL HFA;VENTOLIN HFA) 108 (90 Base) MCG/ACT inhaler Inhale 2 puffs into the lungs every 4 (four) hours as needed for wheezing. Qty: 1 Inhaler, Refills: 0    ciprofloxacin (CIPRO) 500 MG tablet Take 1 tablet (500 mg total) by mouth daily with breakfast. Qty: 30 tablet, Refills: 0    clonazePAM (KLONOPIN) 0.5 MG tablet Take 1 tablet (0.5 mg total) by mouth 2 (two) times daily as needed for anxiety. Qty: 20 tablet, Refills: 0    Fluticasone-Salmeterol (ADVAIR DISKUS) 250-50 MCG/DOSE AEPB Inhale 1 puff into the lungs 2 (two) times daily. Qty: 60 each, Refills: 0    nicotine (NICODERM CQ - DOSED IN MG/24 HR) 7 mg/24hr patch Place 1 patch (7 mg total) onto the skin daily. Qty: 28 patch, Refills: 0  spironolactone (ALDACTONE) 100 MG tablet Take 1 tablet (100 mg total) by mouth daily. Qty: 30 tablet, Refills: 0    tiotropium (SPIRIVA HANDIHALER) 18 MCG inhalation capsule Place 1 capsule (18 mcg total) into inhaler and inhale daily. Qty: 30 capsule, Refills: 0    traMADol (ULTRAM) 50 MG tablet Take 1 tablet (50 mg total) by mouth every 6 (six) hours as needed for moderate pain. Qty: 20 tablet, Refills: 0      CONTINUE these medications which have  NOT CHANGED   Details  albuterol (PROVENTIL) (2.5 MG/3ML) 0.083% nebulizer solution Take 3 mLs (2.5 mg total) by nebulization every 4 (four) hours as needed for wheezing. Qty: 100 vial, Refills: 0    furosemide (LASIX) 40 MG tablet Take 1 tablet (40 mg total) by mouth daily. Qty: 30 tablet, Refills: 1    pantoprazole (PROTONIX) 40 MG tablet 40 mg po bid before meal for 2 weeks, then daily. Qty: 60 tablet, Refills: 0    polyethylene glycol (MIRALAX / GLYCOLAX) packet Take 17 g by mouth daily as needed. Qty: 30 each, Refills: 0         DISCHARGE INSTRUCTIONS:   Follow-up with Donnelly Angelica tomorrow at 11:45 AM  If you experience worsening of your admission symptoms, develop shortness of breath, life threatening emergency, suicidal or homicidal thoughts you must seek medical attention immediately by calling 911 or calling your MD immediately  if symptoms less severe.  You Must read complete instructions/literature along with all the possible adverse reactions/side effects for all the Medicines you take and that have been prescribed to you. Take any new Medicines after you have completely understood and accept all the possible adverse reactions/side effects.   Please note  You were cared for by a hospitalist during your hospital stay. If you have any questions about your discharge medications or the care you received while you were in the hospital after you are discharged, you can call the unit and asked to speak with the hospitalist on call if the hospitalist that took care of you is not available. Once you are discharged, your primary care physician will handle any further medical issues. Please note that NO REFILLS for any discharge medications will be authorized once you are discharged, as it is imperative that you return to your primary care physician (or establish a relationship with a primary care physician if you do not have one) for your aftercare needs so that they can reassess  your need for medications and monitor your lab values.    Today   CHIEF COMPLAINT:   Chief Complaint  Patient presents with  . Abnormal Lab    HISTORY OF PRESENT ILLNESS:  Bradley Dunlap  is a 61 y.o. male called to come in because of a gram-positive cocci in paracentesis culture   VITAL SIGNS:  Blood pressure 101/79, pulse 83, temperature (!) 97.5 F (36.4 C), temperature source Oral, resp. rate 18, height 5\' 8"  (1.727 m), weight 98.1 kg (216 lb 4.8 oz), SpO2 94 %.    PHYSICAL EXAMINATION:  GENERAL:  61 y.o.-year-old patient lying in the bed with no acute distress.  EYES: Pupils equal, round, reactive to light and accommodation. No scleral icterus. Extraocular muscles intact.  HEENT: Head atraumatic, normocephalic. Oropharynx and nasopharynx clear.  NECK:  Supple, no jugular venous distention. No thyroid enlargement, no tenderness.  LUNGS: Normal breath sounds bilaterally, no wheezing, rales,rhonchi or crepitation. No use of accessory muscles of respiration.  CARDIOVASCULAR: S1, S2 normal. No murmurs, rubs,  or gallops.  ABDOMEN: Soft, non-tender, distended. Bowel sounds present. No organomegaly or mass.  EXTREMITIES: No pedal edema, cyanosis, or clubbing.  NEUROLOGIC: Cranial nerves II through XII are intact. Muscle strength 5/5 in all extremities. Sensation intact. Gait not checked.  PSYCHIATRIC: The patient is alert and oriented x 3.  SKIN: No obvious rash, lesion, or ulcer.   DATA REVIEW:   CBC  Recent Labs Lab 01/14/17 0526  WBC 5.1  HGB 11.4*  HCT 31.9*  PLT 99*    Chemistries   Recent Labs Lab 01/14/17 0526  NA 127*  K 4.1  CL 96*  CO2 26  GLUCOSE 101*  BUN 12  CREATININE 0.86  CALCIUM 8.9  AST 70*  ALT 49  ALKPHOS 86  BILITOT 0.7    Microbiology Results  Results for orders placed or performed during the hospital encounter of 01/13/17  Culture, blood (routine x 2)     Status: None (Preliminary result)   Collection Time: 01/13/17  1:14 PM   Result Value Ref Range Status   Specimen Description BLOOD RIGHT ANTECUBITAL  Final   Special Requests   Final    BOTTLES DRAWN AEROBIC AND ANAEROBIC Blood Culture results may not be optimal due to an excessive volume of blood received in culture bottles   Culture NO GROWTH < 24 HOURS  Final   Report Status PENDING  Incomplete  Culture, blood (routine x 2)     Status: None (Preliminary result)   Collection Time: 01/13/17  1:26 PM  Result Value Ref Range Status   Specimen Description BLOOD BLOOD RIGHT HAND  Final   Special Requests   Final    BOTTLES DRAWN AEROBIC AND ANAEROBIC Blood Culture adequate volume   Culture NO GROWTH < 24 HOURS  Final   Report Status PENDING  Incomplete      Management plans discussed with the patient, family and they are in agreement.  CODE STATUS:     Code Status Orders        Start     Ordered   01/13/17 0633  Full code  Continuous     01/13/17 0632    Code Status History    Date Active Date Inactive Code Status Order ID Comments User Context   01/03/2017  5:46 PM 01/05/2017  3:47 PM Full Code [id number]  Gladstone Lighter, MD Inpatient   12/24/2016  1:37 PM 12/27/2016  6:34 PM Full Code [id number]  Fritzi Mandes, MD Inpatient      TOTAL TIME TAKING CARE OF THIS PATIENT: 40 minutes.    Loletha Grayer M.D on 01/14/2017 at 1:59 PM  Between 7am to 6pm - Pager - [phone]  After 6pm go to [email or web address] - Proofreader  [facility] Office  [phone]  CC: Primary care physician; Ellene Route

## 2017-01-14 NOTE — Progress Notes (Signed)
Discussed discharge instructions and medications with patient. IV removed. All questions addressed. Patient transported home via car by [place] bus.  Clarise Cruz, RN

## 2017-01-15 NOTE — ED Provider Notes (Signed)
[facility] Emergency Department Provider Note    First MD Initiated Contact with Patient 01/13/17 0149     (approximate)  I have reviewed the triage vital signs and the nursing notes.   HISTORY  Chief Complaint Abnormal Lab    HPI Bradley Dunlap is a 61 y.o. male with below list of chronic medical conditions including hepatitis C alcohol abuse cirrhosis of the liver presents to the emergency Department referred from [facility]. Patient states that he was called tonight at 12:00 AM and informed that the paracentesis fluid that was performed at [facility] on yesterday was noted to have bacteria in it. Patient states that he was told to go to the emergency department immediately for IV antibiotics. Patient denies any abdominal pain. Patient denies any fever.   Past Medical History:  Diagnosis Date  . Alcohol use   . Ascites   . COPD (chronic obstructive pulmonary disease) ([place])   . Hepatitis C   . Hypertension   . Liver cirrhosis ([place])   . Tobacco use     Patient Active Problem List   Diagnosis Date Noted  . Peritonitis ([place]) 01/13/2017  . Hematemesis 01/03/2017  . Ascites 12/24/2016    Past Surgical History:  Procedure Laterality Date  . BACK SURGERY    . ESOPHAGOGASTRODUODENOSCOPY N/A 01/04/2017   Procedure: ESOPHAGOGASTRODUODENOSCOPY (EGD);  Surgeon: Lin Landsman, MD;  Location: [facility] ENDOSCOPY;  Service: Gastroenterology;  Laterality: N/A;    Prior to Admission medications   Medication Sig Start Date End Date Taking? Authorizing Provider  albuterol (PROVENTIL) (2.5 MG/3ML) 0.083% nebulizer solution Take 3 mLs (2.5 mg total) by nebulization every 4 (four) hours as needed for wheezing. 12/27/16  Yes Wieting, Richard, MD  furosemide (LASIX) 40 MG tablet Take 1 tablet (40 mg total) by mouth daily. 01/05/17 01/05/18 Yes Demetrios Loll, MD  pantoprazole (PROTONIX) 40 MG tablet 40 mg po bid before meal for 2 weeks, then daily. 01/05/17  Yes Demetrios Loll, MD    polyethylene glycol [facility] / Floria Raveling) packet Take 17 g by mouth daily as needed. 12/27/16  Yes Loletha Grayer, MD  albuterol (PROVENTIL HFA;VENTOLIN HFA) 108 (90 Base) MCG/ACT inhaler Inhale 2 puffs into the lungs every 4 (four) hours as needed for wheezing. 01/14/17   Loletha Grayer, MD  ciprofloxacin (CIPRO) 500 MG tablet Take 1 tablet (500 mg total) by mouth daily with breakfast. 01/14/17   Loletha Grayer, MD  clonazePAM (KLONOPIN) 0.5 MG tablet Take 1 tablet (0.5 mg total) by mouth 2 (two) times daily as needed for anxiety. 01/14/17   Loletha Grayer, MD  feeding supplement, ENSURE ENLIVE, (ENSURE ENLIVE) LIQD Take 237 mLs by mouth 2 (two) times daily between meals. 01/14/17   Loletha Grayer, MD  Fluticasone-Salmeterol (ADVAIR DISKUS) 250-50 MCG/DOSE AEPB Inhale 1 puff into the lungs 2 (two) times daily. 01/14/17   Loletha Grayer, MD  nicotine (NICODERM CQ - DOSED IN MG/24 HR) 7 mg/24hr patch Place 1 patch (7 mg total) onto the skin daily. 01/14/17   Loletha Grayer, MD  spironolactone (ALDACTONE) 100 MG tablet Take 1 tablet (100 mg total) by mouth daily. 01/14/17   Loletha Grayer, MD  tiotropium (SPIRIVA HANDIHALER) 18 MCG inhalation capsule Place 1 capsule (18 mcg total) into inhaler and inhale daily. 01/14/17 01/14/18  Loletha Grayer, MD  traMADol (ULTRAM) 50 MG tablet Take 1 tablet (50 mg total) by mouth every 6 (six) hours as needed for moderate pain. 01/14/17   Loletha Grayer, MD    Allergies  Codeine and Indomethacin  Family History  Problem Relation Age of Onset  . Cancer Mother   . Heart attack Father     Social History Social History  Substance Use Topics  . Smoking status: Current Every Day Smoker    Packs/day: 0.50    Types: Cigarettes  . Smokeless tobacco: Former Systems developer  . Alcohol use 1.8 oz/week    3 Cans of beer per week     Comment: 2-3 months    Review of Systems Constitutional: No fever/chills Eyes: No visual changes. ENT: No sore  throat. Cardiovascular: Denies chest pain. Respiratory: Denies shortness of breath. Gastrointestinal: No abdominal pain.  No nausea, no vomiting.  No diarrhea.  No constipation. Genitourinary: Negative for dysuria. Musculoskeletal: Negative for neck pain.  Negative for back pain. Integumentary: Negative for rash. Neurological: Negative for headaches, focal weakness or numbness.  ____________________________________________   PHYSICAL EXAM:  VITAL SIGNS: ED Triage Vitals  Enc Vitals Group     BP 01/13/17 0117 101/67     Pulse Rate 01/13/17 0117 (!) 127     Resp 01/13/17 0117 20     Temp 01/13/17 0117 97.8 F (36.6 C)     Temp Source 01/13/17 0117 Oral     SpO2 01/13/17 0117 98 %     Weight 01/13/17 0118 99.8 kg (220 lb)     Height 01/13/17 0118 1.727 m (5\' 8" )     Head Circumference --      Peak Flow --      Pain Score 01/13/17 0117 9     Pain Loc --      Pain Edu? --      Excl. in [place]? --     Constitutional: Alert and oriented. Well appearing and in no acute distress. Eyes: Conjunctivae are normal.  Head: Atraumatic. Mouth/Throat: Mucous membranes are moist.Oropharynx non-erythematous. Neck: No stridor.  No meningeal signs.  No cervical spine tenderness to palpation. Cardiovascular: Normal rate, regular rhythm. Good peripheral circulation. Grossly normal heart sounds. Respiratory: Normal respiratory effort.  No retractions. Lungs CTAB. Gastrointestinal: Soft and nontender. Positive abdominal distention with fluid wave Musculoskeletal: No lower extremity tenderness nor edema. No gross deformities of extremities. Neurologic:  Normal speech and language. No gross focal neurologic deficits are appreciated.  Skin:  Skin is warm, dry and intact. No rash noted. Psychiatric: Mood and affect are normal. Speech and behavior are normal.  ____________________________________________   LABS (all labs ordered are listed, but only abnormal results are displayed)  Labs Reviewed   COMPREHENSIVE METABOLIC PANEL - Abnormal; Notable for the following:       Result Value   Sodium 130 (*)    Chloride 100 (*)    Glucose, Bld 135 (*)    Albumin 3.0 (*)    AST 78 (*)    All other components within normal limits  CBC - Abnormal; Notable for the following:    RBC 3.82 (*)    Hemoglobin 12.6 (*)    HCT 35.6 (*)    Platelets 120 (*)    All other components within normal limits  COMPREHENSIVE METABOLIC PANEL - Abnormal; Notable for the following:    Sodium 127 (*)    Chloride 96 (*)    Glucose, Bld 101 (*)    Total Protein 6.3 (*)    Albumin 2.8 (*)    AST 70 (*)    All other components within normal limits  CBC - Abnormal; Notable for the following:    RBC 3.36 (*)  Hemoglobin 11.4 (*)    HCT 31.9 (*)    Platelets 99 (*)    All other components within normal limits  CULTURE, BLOOD (ROUTINE X 2)  CULTURE, BLOOD (ROUTINE X 2)  LIPASE, BLOOD   ____________________________________________  EKG  ED ECG REPORT I,  N BROWN, the attending physician, personally viewed and interpreted this ECG.   Date: 01/15/2017  EKG Time: 1:28 AM  Rate: 119  Rhythm: Sinus tachycardia  Axis: Normal  Intervals: Normal  ST&T Change: None   Procedures   ____________________________________________   INITIAL IMPRESSION / ASSESSMENT AND PLAN / ED COURSE  Pertinent labs & imaging results that were available during my care of the patient were reviewed by me and considered in my medical decision making (see chart for details).     Clinical Course as of Jan 16 743  Wed Jan 13, 2017  0322 Pulse Rate: (!) 37 [RB]  0323 Pulse Rate: (!) 37 [RB]    Clinical Course User Index [RB] Gregor Hams, MD   She discussed the [facility] charge nurse on call who informed me that the physician that call the patient was secondary to paracentesis fluid that had gram-positive cocci in clusters. Patient noted to be tachycardic in the emergency department however afebrile. Patient  will be given IV Vancocin Zosyn while awaiting culture results culture results. I spoke with the patient at length and informed him that the cultures that were obtained may have been a contamination from the skin as I think spontaneous bacterial peritonitis is unlikely given the fact that the patient has no fever or abdominal discomfort or any other symptoms to implies BP ____________________________________________  FINAL CLINICAL IMPRESSION(S) / ED DIAGNOSES  Final diagnoses:  Peritoneal fluid abnormality     MEDICATIONS GIVEN DURING THIS VISIT:  Medications  ibuprofen (ADVIL,MOTRIN) tablet 600 mg ( Oral Canceled Entry 01/13/17 0746)  vancomycin (VANCOCIN) IVPB 1000 mg/200 mL premix (0 mg Intravenous Stopped 01/13/17 0747)  piperacillin-tazobactam (ZOSYN) IVPB 3.375 g (0 g Intravenous Stopped 01/13/17 0746)  morphine 4 MG/ML injection 4 mg (4 mg Intravenous Given 01/13/17 0356)  Influenza vac split quadrivalent PF (FLUARIX) injection 0.5 mL (0.5 mLs Intramuscular Given 01/14/17 0951)     NEW OUTPATIENT MEDICATIONS STARTED DURING THIS VISIT:  Discharge Medication List as of 01/14/2017  1:30 PM    START taking these medications   Details  feeding supplement, ENSURE ENLIVE, (ENSURE ENLIVE) LIQD Take 237 mLs by mouth 2 (two) times daily between meals., Starting Thu 01/14/2017, Print        Discharge Medication List as of 01/14/2017  1:30 PM    CONTINUE these medications which have CHANGED   Details  albuterol (PROVENTIL HFA;VENTOLIN HFA) 108 (90 Base) MCG/ACT inhaler Inhale 2 puffs into the lungs every 4 (four) hours as needed for wheezing., Starting Thu 01/14/2017, Print    ciprofloxacin (CIPRO) 500 MG tablet Take 1 tablet (500 mg total) by mouth daily with breakfast., Starting Thu 01/14/2017, Print    clonazePAM (KLONOPIN) 0.5 MG tablet Take 1 tablet (0.5 mg total) by mouth 2 (two) times daily as needed for anxiety., Starting Thu 01/14/2017, Print    Fluticasone-Salmeterol (ADVAIR  DISKUS) 250-50 MCG/DOSE AEPB Inhale 1 puff into the lungs 2 (two) times daily., Starting Thu 01/14/2017, Print    nicotine (NICODERM CQ - DOSED IN MG/24 HR) 7 mg/24hr patch Place 1 patch (7 mg total) onto the skin daily., Starting Thu 01/14/2017, Print    spironolactone (ALDACTONE) 100 MG tablet Take 1 tablet (  100 mg total) by mouth daily., Starting Thu 01/14/2017, Print    tiotropium (SPIRIVA HANDIHALER) 18 MCG inhalation capsule Place 1 capsule (18 mcg total) into inhaler and inhale daily., Starting Thu 01/14/2017, Until Fri 01/14/2018, Print    traMADol (ULTRAM) 50 MG tablet Take 1 tablet (50 mg total) by mouth every 6 (six) hours as needed for moderate pain., Starting Thu 01/14/2017, Print        Discharge Medication List as of 01/14/2017  1:30 PM       Note:  This document was prepared using Dragon voice recognition software and may include unintentional dictation errors.    Gregor Hams, MD 01/15/17 [phone]

## 2017-01-18 LAB — CULTURE, BLOOD (ROUTINE X 2)
CULTURE: NO GROWTH
Culture: NO GROWTH
SPECIAL REQUESTS: ADEQUATE

## 2017-01-21 ENCOUNTER — Encounter: Payer: Self-pay | Admitting: *Deleted

## 2017-01-22 ENCOUNTER — Encounter: Payer: Self-pay | Admitting: Anesthesiology

## 2017-01-22 ENCOUNTER — Ambulatory Visit: Payer: Medicaid Other | Admitting: Anesthesiology

## 2017-01-22 ENCOUNTER — Encounter
Admission: RE | Disposition: A | Discharge status: Home Health | Payer: Self-pay | Source: Ambulatory Visit | Attending: Internal Medicine

## 2017-01-22 ENCOUNTER — Ambulatory Visit
Admission: RE | Admit: 2017-01-22 | Discharge: 2017-01-22 | Disposition: A | Discharge status: Home Health | Payer: Medicaid Other | Source: Ambulatory Visit | Attending: Internal Medicine | Admitting: Internal Medicine

## 2017-01-22 DIAGNOSIS — I10 Essential (primary) hypertension: Secondary | ICD-10-CM | POA: Diagnosis not present

## 2017-01-22 DIAGNOSIS — K766 Portal hypertension: Secondary | ICD-10-CM | POA: Insufficient documentation

## 2017-01-22 DIAGNOSIS — R188 Other ascites: Secondary | ICD-10-CM | POA: Insufficient documentation

## 2017-01-22 DIAGNOSIS — K219 Gastro-esophageal reflux disease without esophagitis: Secondary | ICD-10-CM | POA: Diagnosis not present

## 2017-01-22 DIAGNOSIS — J449 Chronic obstructive pulmonary disease, unspecified: Secondary | ICD-10-CM | POA: Insufficient documentation

## 2017-01-22 DIAGNOSIS — Z79899 Other long term (current) drug therapy: Secondary | ICD-10-CM | POA: Insufficient documentation

## 2017-01-22 DIAGNOSIS — F419 Anxiety disorder, unspecified: Secondary | ICD-10-CM | POA: Diagnosis not present

## 2017-01-22 DIAGNOSIS — F17200 Nicotine dependence, unspecified, uncomplicated: Secondary | ICD-10-CM | POA: Insufficient documentation

## 2017-01-22 DIAGNOSIS — B1920 Unspecified viral hepatitis C without hepatic coma: Secondary | ICD-10-CM | POA: Insufficient documentation

## 2017-01-22 DIAGNOSIS — K7460 Unspecified cirrhosis of liver: Secondary | ICD-10-CM | POA: Diagnosis not present

## 2017-01-22 DIAGNOSIS — I8500 Esophageal varices without bleeding: Secondary | ICD-10-CM | POA: Diagnosis not present

## 2017-01-22 DIAGNOSIS — K922 Gastrointestinal hemorrhage, unspecified: Secondary | ICD-10-CM | POA: Diagnosis not present

## 2017-01-22 DIAGNOSIS — K3189 Other diseases of stomach and duodenum: Secondary | ICD-10-CM | POA: Diagnosis not present

## 2017-01-22 DIAGNOSIS — B3781 Candidal esophagitis: Secondary | ICD-10-CM | POA: Diagnosis not present

## 2017-01-22 HISTORY — PX: ESOPHAGOGASTRODUODENOSCOPY (EGD) WITH PROPOFOL: SHX5813

## 2017-01-22 HISTORY — DX: Anxiety disorder, unspecified: F41.9

## 2017-01-22 LAB — CBC
HCT: 38 % — ABNORMAL LOW (ref 40.0–52.0)
Hemoglobin: 13.2 g/dL (ref 13.0–18.0)
MCH: 32.5 pg (ref 26.0–34.0)
MCHC: 34.7 g/dL (ref 32.0–36.0)
MCV: 93.9 fL (ref 80.0–100.0)
PLATELETS: 129 10*3/uL — AB (ref 150–440)
RBC: 4.05 MIL/uL — ABNORMAL LOW (ref 4.40–5.90)
RDW: 14.2 % (ref 11.5–14.5)
WBC: 4.7 10*3/uL (ref 3.8–10.6)

## 2017-01-22 LAB — COMPREHENSIVE METABOLIC PANEL
ALK PHOS: 89 U/L (ref 38–126)
ALT: 77 U/L — ABNORMAL HIGH (ref 17–63)
AST: 110 U/L — AB (ref 15–41)
Albumin: 3.4 g/dL — ABNORMAL LOW (ref 3.5–5.0)
Anion gap: 7 (ref 5–15)
BILIRUBIN TOTAL: 1.1 mg/dL (ref 0.3–1.2)
BUN: 9 mg/dL (ref 6–20)
CALCIUM: 9.4 mg/dL (ref 8.9–10.3)
CO2: 27 mmol/L (ref 22–32)
Chloride: 95 mmol/L — ABNORMAL LOW (ref 101–111)
Creatinine, Ser: 0.76 mg/dL (ref 0.61–1.24)
GFR calc Af Amer: >60 mL/min (ref >60)
Glucose, Bld: 112 mg/dL — ABNORMAL HIGH (ref 65–99)
POTASSIUM: 4.2 mmol/L (ref 3.5–5.1)
Sodium: 129 mmol/L — ABNORMAL LOW (ref 135–145)
TOTAL PROTEIN: 7.6 g/dL (ref 6.5–8.1)

## 2017-01-22 LAB — KOH PREP

## 2017-01-22 LAB — PROTIME-INR
INR: 1.09
PROTHROMBIN TIME: 14 s (ref 11.4–15.2)

## 2017-01-22 SURGERY — ESOPHAGOGASTRODUODENOSCOPY (EGD) WITH PROPOFOL
Anesthesia: General

## 2017-01-22 MED ORDER — IPRATROPIUM-ALBUTEROL 0.5-2.5 (3) MG/3ML IN SOLN
RESPIRATORY_TRACT | Status: AC
  Administered 2017-01-22: 3 mL via RESPIRATORY_TRACT
  Filled 2017-01-22: qty 3

## 2017-01-22 MED ORDER — FENTANYL CITRATE (PF) 100 MCG/2ML IJ SOLN
INTRAMUSCULAR | Status: DC | PRN
  Administered 2017-01-22: 50 ug via INTRAVENOUS

## 2017-01-22 MED ORDER — PROPOFOL 10 MG/ML IV BOLUS
INTRAVENOUS | Status: DC | PRN
  Administered 2017-01-22: 70 mg via INTRAVENOUS

## 2017-01-22 MED ORDER — GLYCOPYRROLATE 0.2 MG/ML IJ SOLN
INTRAMUSCULAR | Status: AC
  Filled 2017-01-22: qty 1

## 2017-01-22 MED ORDER — MIDAZOLAM HCL 2 MG/2ML IJ SOLN
INTRAMUSCULAR | Status: DC | PRN
  Administered 2017-01-22: 1 mg via INTRAVENOUS

## 2017-01-22 MED ORDER — LIDOCAINE HCL (CARDIAC) 20 MG/ML IV SOLN
INTRAVENOUS | Status: DC | PRN
  Administered 2017-01-22: 40 mg via INTRAVENOUS

## 2017-01-22 MED ORDER — SODIUM CHLORIDE 0.9 % IV SOLN
INTRAVENOUS | Status: DC
  Administered 2017-01-22: 13:00:00 via INTRAVENOUS

## 2017-01-22 MED ORDER — IPRATROPIUM-ALBUTEROL 0.5-2.5 (3) MG/3ML IN SOLN
3.0000 mL | Freq: Once | RESPIRATORY_TRACT | Status: AC
  Administered 2017-01-22: 3 mL via RESPIRATORY_TRACT

## 2017-01-22 MED ORDER — MIDAZOLAM HCL 2 MG/2ML IJ SOLN
INTRAMUSCULAR | Status: AC
  Filled 2017-01-22: qty 2

## 2017-01-22 MED ORDER — LIDOCAINE HCL (PF) 2 % IJ SOLN
INTRAMUSCULAR | Status: AC
  Filled 2017-01-22: qty 2

## 2017-01-22 MED ORDER — GLYCOPYRROLATE 0.2 MG/ML IJ SOLN
INTRAMUSCULAR | Status: DC | PRN
  Administered 2017-01-22: 0.2 mg via INTRAVENOUS

## 2017-01-22 MED ORDER — PROPOFOL 500 MG/50ML IV EMUL
INTRAVENOUS | Status: DC | PRN
  Administered 2017-01-22: 140 ug/kg/min via INTRAVENOUS

## 2017-01-22 MED ORDER — FENTANYL CITRATE (PF) 100 MCG/2ML IJ SOLN
INTRAMUSCULAR | Status: AC
  Filled 2017-01-22: qty 2

## 2017-01-22 NOTE — Transfer of Care (Signed)
Immediate Anesthesia Transfer of Care Note  Patient: Bradley Dunlap  Procedure(s) Performed: Procedure(s): ESOPHAGOGASTRODUODENOSCOPY (EGD) WITH PROPOFOL (N/A)  Patient Location: PACU  Anesthesia Type:General  Level of Consciousness: awake, alert  and oriented  Airway & Oxygen Therapy: Patient Spontanous Breathing and Patient connected to nasal cannula oxygen  Post-op Assessment: Report given to RN and Post -op Vital signs reviewed and stable  Post vital signs: Reviewed and stable  Last Vitals:  Vitals:   01/22/17 1415 01/22/17 1416  BP: (P) 114/89 114/89  Pulse:  99  Resp:  12  Temp: (!) (P) 36.1 C (!) 36.3 C  SpO2:  99%    Last Pain:  Vitals:   01/22/17 1415  TempSrc: (P) Tympanic  PainSc:          Complications: No apparent anesthesia complications

## 2017-01-22 NOTE — H&P (Signed)
Outpatient short stay form Pre-procedure 01/22/2017 1:19 PM Bradley Dunlap, M.D.  Primary Physician: N/A, followed by Ms. [facility] in our office.  Reason for visit:  Esohphageal varices   History of present illness:  Patient is a 61 y./o wm with HCV related cirrhosis presenting for f/u esophageal variceal banding after initial banding emergently on 01/04/2017 by DR. Vanga. He has been doing well in regards to having no re-bleeding, however, he has been having need for recurrent paracenteses for massive abdominal ascites.     Current Facility-Administered Medications:  .  0.9 %  sodium chloride infusion, , Intravenous, Continuous, [place], Benay Pike, MD, Last Rate: 20 mL/hr at 01/22/17 1306  Prescriptions Prior to Admission  Medication Sig Dispense Refill Last Dose  . omeprazole (PRILOSEC) 20 MG capsule Take 20 mg by mouth daily.     Marland Kitchen albuterol (PROVENTIL HFA;VENTOLIN HFA) 108 (90 Base) MCG/ACT inhaler Inhale 2 puffs into the lungs every 4 (four) hours as needed for wheezing. 1 Inhaler 0   . albuterol (PROVENTIL) (2.5 MG/3ML) 0.083% nebulizer solution Take 3 mLs (2.5 mg total) by nebulization every 4 (four) hours as needed for wheezing. 100 vial 0 Past Week at Unknown time  . ciprofloxacin (CIPRO) 500 MG tablet Take 1 tablet (500 mg total) by mouth daily with breakfast. 30 tablet 0   . clonazePAM (KLONOPIN) 0.5 MG tablet Take 1 tablet (0.5 mg total) by mouth 2 (two) times daily as needed for anxiety. 20 tablet 0   . feeding supplement, ENSURE ENLIVE, (ENSURE ENLIVE) LIQD Take 237 mLs by mouth 2 (two) times daily between meals. 60 Bottle 0   . Fluticasone-Salmeterol (ADVAIR DISKUS) 250-50 MCG/DOSE AEPB Inhale 1 puff into the lungs 2 (two) times daily. 60 each 0   . furosemide (LASIX) 40 MG tablet Take 1 tablet (40 mg total) by mouth daily. (Patient taking differently: Take 20 mg by mouth daily. ) 30 tablet 1 01/12/2017 at Unknown time  . nicotine (NICODERM CQ - DOSED IN MG/24 HR) 7  mg/24hr patch Place 1 patch (7 mg total) onto the skin daily. 28 patch 0   . polyethylene glycol (MIRALAX / GLYCOLAX) packet Take 17 g by mouth daily as needed. 30 each 0 prn at prn  . spironolactone (ALDACTONE) 100 MG tablet Take 1 tablet (100 mg total) by mouth daily. 30 tablet 0   . tiotropium (SPIRIVA HANDIHALER) 18 MCG inhalation capsule Place 1 capsule (18 mcg total) into inhaler and inhale daily. 30 capsule 0 01/22/2017 at 0800  . traMADol (ULTRAM) 50 MG tablet Take 1 tablet (50 mg total) by mouth every 6 (six) hours as needed for moderate pain. 20 tablet 0      Allergies  Allergen Reactions  . Codeine     Other reaction(s): Other (See Comments)  . Indomethacin     Other reaction(s): ITCHING     Past Medical History:  Diagnosis Date  . Alcohol use   . Anxiety   . Ascites   . COPD (chronic obstructive pulmonary disease) ([place])   . Hepatitis C   . Hypertension   . Liver cirrhosis ([place])   . Tobacco use     Review of systems:     Review of Systems - General ROS: positive for  - hot flashes, otherwise negative.   Physical Exam  Physical Exam  Constitutional: He is well-developed, well-nourished, and in no distress.  HENT:  Head: Normocephalic and atraumatic.  Eyes: Pupils are equal, round, and reactive to light.  Neck: No tracheal deviation present.  Cardiovascular: Normal rate and regular rhythm.   Pulmonary/Chest: Effort normal. No respiratory distress. He has no rales. He exhibits no tenderness.  Abdominal: He exhibits shifting dullness, distension, fluid wave and ascites. He exhibits no pulsatile midline mass and no mass. There is no tenderness. There is no rebound and no guarding.       Planned procedures: EGD with variceal banding and possible hemostasis. The patient understands the nature of the planned procedure, indications, risks, alternatives and potential complications including but not limited to bleeding, infection, perforation, damage to internal organs  and possible oversedation/side effects from anesthesia. The patient agrees and gives consent to proceed.  Please refer to procedure notes for findings, recommendations and patient disposition/instructions.    Bradley Dunlap, M.D. Gastroenterology 01/22/2017  1:19 PM

## 2017-01-22 NOTE — Interval H&P Note (Signed)
History and Physical Interval Note:  01/22/2017 1:49 PM  Bradley Dunlap  has presented today for surgery, with the diagnosis of ESOPH VARICES  The various methods of treatment have been discussed with the patient and family. After consideration of risks, benefits and other options for treatment, the patient has consented to  Procedure(s): ESOPHAGOGASTRODUODENOSCOPY (EGD) WITH PROPOFOL (N/A) as a surgical intervention .  The patient's history has been reviewed, patient examined, no change in status, stable for surgery.  I have reviewed the patient's chart and labs.  Questions were answered to the patient's satisfaction.     [place], [place]

## 2017-01-22 NOTE — Anesthesia Post-op Follow-up Note (Signed)
Anesthesia QCDR form completed.        

## 2017-01-22 NOTE — Anesthesia Postprocedure Evaluation (Signed)
Anesthesia Post Note  Patient: Bradley Dunlap  Procedure(s) Performed: Procedure(s) (LRB): ESOPHAGOGASTRODUODENOSCOPY (EGD) WITH PROPOFOL (N/A)  Patient location during evaluation: Endoscopy Anesthesia Type: General Level of consciousness: awake and alert and oriented Pain management: pain level controlled Vital Signs Assessment: post-procedure vital signs reviewed and stable Respiratory status: spontaneous breathing, nonlabored ventilation and respiratory function stable Cardiovascular status: blood pressure returned to baseline and stable Postop Assessment: no signs of nausea or vomiting Anesthetic complications: no     Last Vitals:  Vitals:   01/22/17 1425 01/22/17 1435  BP: 123/85 130/82  Pulse: (!) 102 99  Resp: (!) 21 17  Temp:    SpO2: 97% 98%    Last Pain:  Vitals:   01/22/17 1415  TempSrc: Tympanic  PainSc:                  Robertt Buda

## 2017-01-22 NOTE — Anesthesia Preprocedure Evaluation (Signed)
Anesthesia Evaluation  Patient identified by MRN, date of birth, ID band Patient awake    Reviewed: Allergy & Precautions, H&P , NPO status , Patient's Chart, lab work & pertinent test results  History of Anesthesia Complications (+) PROLONGED EMERGENCE and history of anesthetic complications  Airway Mallampati: III  TM Distance: <3 FB Neck ROM: limited    Dental  (+) Poor Dentition, Chipped, Missing   Pulmonary shortness of breath and with exertion, COPD, Current Smoker,           Cardiovascular Exercise Tolerance: Poor hypertension, (-) angina(-) Past MI      Neuro/Psych negative neurological ROS  negative psych ROS   GI/Hepatic negative GI ROS, neg GERD  ,(+) Cirrhosis       , Hepatitis -, C  Endo/Other  negative endocrine ROS  Renal/GU negative Renal ROS  negative genitourinary   Musculoskeletal   Abdominal   Peds  Hematology negative hematology ROS (+)   Anesthesia Other Findings Past Medical History: No date: Alcohol use No date: Ascites No date: COPD (chronic obstructive pulmonary disease) (HCC) No date: Hepatitis C No date: Hypertension No date: Liver cirrhosis (H[facility]) No date: Tobacco use  Past Surgical History: No date: BACK SURGERY  BMI    Body Mass Index:  33.45 kg/m      Reproductive/Obstetrics negative OB ROS                             Anesthesia Physical  Anesthesia Plan  ASA: IV  Anesthesia Plan: General   Post-op Pain Management:    Induction: Intravenous  PONV Risk Score and Plan: Propofol infusion  Airway Management Planned: Natural Airway and Nasal Cannula  Additional Equipment:   Intra-op Plan:   Post-operative Plan:   Informed Consent: I have reviewed the patients History and Physical, chart, labs and discussed the procedure including the risks, benefits and alternatives for the proposed anesthesia with the patient or authorized  representative who has indicated his/her understanding and acceptance.   Dental Advisory Given  Plan Discussed with: Anesthesiologist, CRNA and Surgeon  Anesthesia Plan Comments: (Patient informed that they are higher risk for complications from anesthesia during this procedure due to their medical history.  Patient voiced understanding.  Patient consented for risks of anesthesia including but not limited to:  - adverse reactions to medications - risk of intubation if required - damage to teeth, lips or other oral mucosa - sore throat or hoarseness - Damage to heart, brain, lungs or loss of life  Patient voiced understanding.)        Anesthesia Quick Evaluation

## 2017-01-22 NOTE — Op Note (Signed)
[facility] Gastroenterology Patient Name: Bradley Dunlap Procedure Date: 01/22/2017 1:51 PM MRN: [id number] Account #: [id number] Date of Birth: 08-27-1955 Admit Type: Outpatient Age: 61 Room: [facility] ENDO ROOM 4 Gender: Male Note Status: Finalized Procedure:            Upper GI endoscopy Indications:          Recent gastrointestinal bleeding, Follow-up of                        esophageal varices in patient with suspected portal                        hypertension Providers:            Benay Pike. Alice Reichert MD, MD Referring MD:         No Local Md, MD (Referring MD) Medicines:            Propofol per Anesthesia Complications:        No immediate complications. Procedure:            Pre-Anesthesia Assessment:                       - ASA Grade Assessment: IV - A patient with severe                        systemic disease that is a constant threat to life.                       - After reviewing the risks and benefits, the patient                        was deemed in satisfactory condition to undergo the                        procedure.                       After obtaining informed consent, the endoscope was                        passed under direct vision. Throughout the procedure,                        the patient's blood pressure, pulse, and oxygen                        saturations were monitored continuously. The Endoscope                        was introduced through the mouth, and advanced to the                        third part of duodenum. The upper GI endoscopy was                        accomplished without difficulty. The patient tolerated                        the procedure well. Findings:      Localized candidiasis was found in the distal esophagus. Brushings for  KOH prep were obtained in the distal esophagus.      Two columns of non-bleeding grade I varices were found in the distal       esophagus,. No stigmata of recent bleeding were evident  and no red wale       signs were present. Stigmata of prior treatment were evident. The       varices appeared smaller than they were at prior exam.      Moderate portal hypertensive gastropathy was found in the entire       examined stomach.      No other significant abnormalities were identified in a careful       examination of the stomach.      The examined duodenum was normal. Impression:           - Monilial esophagitis. Brushings performed.                       - Non-bleeding grade I esophageal varices.                       - Portal hypertensive gastropathy.                       - Normal examined duodenum. Recommendation:       - Patient has a contact number available for                        emergencies. The signs and symptoms of potential                        delayed complications were discussed with the patient.                        Return to normal activities tomorrow. Written discharge                        instructions were provided to the patient.                       - Low sodium diet indefinitely.                       - Continue present medications.                       - Await pathology results.                       - Repeat upper endoscopy in 3 months for endoscopic                        band ligation.                       - Return to GI office as previously scheduled.                       - The findings and recommendations were discussed with                        the patient.                       -  The findings and recommendations were discussed with                        the designated responsible adult. Procedure Code(s):    --- Professional ---                       [phone], Esophagogastroduodenoscopy, flexible, transoral;                        diagnostic, including collection of specimen(s) by                        brushing or washing, when performed (separate procedure) Diagnosis Code(s):    --- Professional ---                        B37.81, Candidal esophagitis                       I85.00, Esophageal varices without bleeding                       K76.6, Portal hypertension                       K31.89, Other diseases of stomach and duodenum                       K92.2, Gastrointestinal hemorrhage, unspecified CPT copyright 2016 American Medical Association. All rights reserved. The codes documented in this report are preliminary and upon coder review may  be revised to meet current compliance requirements. Olean Ree, MD Efrain Sella MD, MD 01/22/2017 2:15:24 PM This report has been signed electronically. Number of Addenda: 0 Note Initiated On: 01/22/2017 1:51 PM      [facility]

## 2017-01-24 ENCOUNTER — Encounter: Payer: Self-pay | Admitting: Internal Medicine

## 2017-01-27 DIAGNOSIS — K7031 Alcoholic cirrhosis of liver with ascites: Secondary | ICD-10-CM | POA: Insufficient documentation

## 2017-02-01 ENCOUNTER — Other Ambulatory Visit: Payer: Self-pay | Admitting: Gastroenterology

## 2017-02-01 DIAGNOSIS — K7031 Alcoholic cirrhosis of liver with ascites: Secondary | ICD-10-CM

## 2017-02-02 NOTE — Discharge Instructions (Signed)
Paracentesis, Care After °Refer to this sheet in the next few weeks. These instructions provide you with information about caring for yourself after your procedure. Your health care provider may also give you more specific instructions. Your treatment has been planned according to current medical practices, but problems sometimes occur. Call your health care provider if you have any problems or questions after your procedure. °What can I expect after the procedure? °After your procedure, it is common to have a small amount of clear fluid coming from the puncture site. °Follow these instructions at home: °· Return to your normal activities as told by your health care provider. Ask your health care provider what activities are safe for you. °· Take over-the-counter and prescription medicines only as told by your health care provider. °· Do not take baths, swim, or use a hot tub until your health care provider approves. °· Follow instructions from your health care provider about: °? How to take care of your puncture site. °? When and how you should change your bandage (dressing). °? When you should remove your dressing. °· Check your puncture area every day signs of infection. Watch for: °? Redness, swelling, or pain. °? Fluid, blood, or pus. °· Keep all follow-up visits as told by your health care provider. This is important. °Contact a health care provider if: °· You have redness, swelling, or pain at your puncture site. °· You start to have more clear fluid coming from your puncture site. °· You have blood or pus coming from your puncture site. °· You have chills. °· You have a fever. °Get help right away if: °· You develop chest pain or shortness of breath. °· You develop increasing pain, discomfort, or swelling in your abdomen. °· You feel dizzy or light-headed or you pass out. °This information is not intended to replace advice given to you by your health care provider. Make sure you discuss any questions you  have with your health care provider. °Document Released: 08/28/2014 Document Revised: 09/19/2015 Document Reviewed: 06/26/2014 °Elsevier Interactive Patient Education © 2018 Elsevier Inc. ° °

## 2017-02-03 ENCOUNTER — Emergency Department
Admission: EM | Admit: 2017-02-03 | Discharge: 2017-02-03 | Disposition: A | Discharge status: Home / Self Care | Payer: Medicaid Other | Attending: Emergency Medicine | Admitting: Emergency Medicine

## 2017-02-03 ENCOUNTER — Emergency Department: Payer: Medicaid Other

## 2017-02-03 DIAGNOSIS — J449 Chronic obstructive pulmonary disease, unspecified: Secondary | ICD-10-CM | POA: Diagnosis not present

## 2017-02-03 DIAGNOSIS — R188 Other ascites: Secondary | ICD-10-CM

## 2017-02-03 DIAGNOSIS — R0602 Shortness of breath: Secondary | ICD-10-CM | POA: Diagnosis present

## 2017-02-03 DIAGNOSIS — F17210 Nicotine dependence, cigarettes, uncomplicated: Secondary | ICD-10-CM | POA: Diagnosis not present

## 2017-02-03 DIAGNOSIS — R0600 Dyspnea, unspecified: Secondary | ICD-10-CM | POA: Diagnosis not present

## 2017-02-03 DIAGNOSIS — I10 Essential (primary) hypertension: Secondary | ICD-10-CM | POA: Insufficient documentation

## 2017-02-03 LAB — BASIC METABOLIC PANEL
ANION GAP: 5 (ref 5–15)
BUN: 8 mg/dL (ref 6–20)
CHLORIDE: 97 mmol/L — AB (ref 101–111)
CO2: 25 mmol/L (ref 22–32)
Calcium: 9.3 mg/dL (ref 8.9–10.3)
Creatinine, Ser: 0.78 mg/dL (ref 0.61–1.24)
GFR calc Af Amer: >60 mL/min (ref >60)
GFR calc non Af Amer: >60 mL/min (ref >60)
Glucose, Bld: 121 mg/dL — ABNORMAL HIGH (ref 65–99)
POTASSIUM: 4.4 mmol/L (ref 3.5–5.1)
SODIUM: 127 mmol/L — AB (ref 135–145)

## 2017-02-03 LAB — CBC
HEMATOCRIT: 39.9 % — AB (ref 40.0–52.0)
HEMOGLOBIN: 14 g/dL (ref 13.0–18.0)
MCH: 32.3 pg (ref 26.0–34.0)
MCHC: 35.2 g/dL (ref 32.0–36.0)
MCV: 91.8 fL (ref 80.0–100.0)
PLATELETS: 140 10*3/uL — AB (ref 150–440)
RBC: 4.34 MIL/uL — AB (ref 4.40–5.90)
RDW: 13.7 % (ref 11.5–14.5)
WBC: 7.3 10*3/uL (ref 3.8–10.6)

## 2017-02-03 NOTE — ED Triage Notes (Signed)
Pt states he has appointment with his PCP tomorrow to have fluid removed from abd. Pt states his breathing is to difficult to wait.

## 2017-02-03 NOTE — ED Provider Notes (Signed)
[facility] Emergency Department Provider Note       Time seen: ----------------------------------------- 11:58 AM on 02/03/2017 -----------------------------------------     I have reviewed the triage vital signs and the nursing notes.   HISTORY   Chief Complaint Shortness of Breath    HPI Bradley Dunlap is a 61 y.o. male with a history of alcohol abuse, hepatitis C and cirrhosis with ascites who presents to the ED for shortness of breath. Patient states he has a point with his primary care doctor tomorrow to have paracentesis done but states his breathing has become more difficult and he does not think he can wait.  Past Medical History:  Diagnosis Date  . Alcohol use   . Anxiety   . Ascites   . COPD (chronic obstructive pulmonary disease) ([place])   . Hepatitis C   . Hypertension   . Liver cirrhosis ([place])   . Tobacco use     Patient Active Problem List   Diagnosis Date Noted  . Peritonitis ([place]) 01/13/2017  . Hematemesis 01/03/2017  . Ascites 12/24/2016    Past Surgical History:  Procedure Laterality Date  . BACK SURGERY    . ESOPHAGOGASTRODUODENOSCOPY N/A 01/04/2017   Procedure: ESOPHAGOGASTRODUODENOSCOPY (EGD);  Surgeon: Lin Landsman, MD;  Location: [facility] ENDOSCOPY;  Service: Gastroenterology;  Laterality: N/A;  . ESOPHAGOGASTRODUODENOSCOPY (EGD) WITH PROPOFOL N/A 01/22/2017   Procedure: ESOPHAGOGASTRODUODENOSCOPY (EGD) WITH PROPOFOL;  Surgeon: [place], Benay Pike, MD;  Location: [facility] ENDOSCOPY;  Service: Gastroenterology;  Laterality: N/A;    Allergies Codeine and Indomethacin  Social History Social History  Substance Use Topics  . Smoking status: Current Every Day Smoker    Packs/day: 0.50    Types: Cigarettes  . Smokeless tobacco: Former Systems developer  . Alcohol use No     Comment: 2-3 months    Review of Systems Constitutional: Negative for fever. Eyes: Negative for vision changes ENT:  Negative for congestion, sore  throat Cardiovascular: Negative for chest pain. Respiratory: positive for shortness of breath Gastrointestinal: positive for abdominal distention and swelling Genitourinary: Negative for dysuria. Musculoskeletal: Negative for back pain. Skin: Negative for rash. Neurological: Negative for headaches, focal weakness or numbness.  All systems negative/normal/unremarkable except as stated in the HPI  ____________________________________________   PHYSICAL EXAM:  VITAL SIGNS: ED Triage Vitals  Enc Vitals Group     BP 02/03/17 0933 124/69     Pulse Rate 02/03/17 0933 83     Resp 02/03/17 0933 (!) 22     Temp 02/03/17 0933 98.5 F (36.9 C)     Temp Source 02/03/17 0933 Oral     SpO2 02/03/17 0933 93 %     Weight 02/03/17 0927 220 lb (99.8 kg)     Height 02/03/17 0927 5\' 8"  (1.727 m)     Head Circumference --      Peak Flow --      Pain Score --      Pain Loc --      Pain Edu? --      Excl. in [place]? --     Constitutional: Alert and oriented. Well appearing and in no distress. Eyes: Conjunctivae are normal. Normal extraocular movements. ENT   Head: Normocephalic and atraumatic.   Nose: No congestion/rhinnorhea.   Mouth/Throat: Mucous membranes are moist.   Neck: No stridor. Cardiovascular: Normal rate, regular rhythm. No murmurs, rubs, or gallops. Respiratory: Normal respiratory effort without tachypnea nor retractions. Breath sounds are clear and equal bilaterally. No wheezes/rales/rhonchi. Gastrointestinal: Soft and nontender.  Normal bowel sounds Musculoskeletal: Nontender with normal range of motion in extremities. No lower extremity tenderness nor edema. Neurologic:  Normal speech and language. No gross focal neurologic deficits are appreciated.  Skin:  Skin is warm, dry and intact. No rash noted. Psychiatric: Mood and affect are normal. Speech and behavior are normal.  ____________________________________________  ED COURSE:  Pertinent labs & imaging results  that were available during my care of the patient were reviewed by me and considered in my medical decision making (see chart for details). Patient presents for abdominal swelling and shortness of breath requesting paracentesis, we will assess with labs and imaging as indicated.   Procedures ____________________________________________   LABS (pertinent positives/negatives)  Labs Reviewed  BASIC METABOLIC PANEL - Abnormal; Notable for the following:       Result Value   Sodium 127 (*)    Chloride 97 (*)    Glucose, Bld 121 (*)    All other components within normal limits  CBC - Abnormal; Notable for the following:    RBC 4.34 (*)    HCT 39.9 (*)    Platelets 140 (*)    All other components within normal limits    RADIOLOGY  ultrasound paracentesis cannot be performed  ____________________________________________  DIFFERENTIAL DIAGNOSIS   ascites, cirrhosis, CHF, SBP   FINAL ASSESSMENT AND PLAN  ascites   Plan: Patient had presented for a paracentesis. Patients labs were reassuring and at his baseline.we had ordered a paracentesis which she does have scheduled for tomorrow at 2 PM. We will unable to provide a paracentesis worked today. He is in no distress and is stable for follow-up tomorrow.   Earleen Newport, MD   Note: This note was generated in part or whole with voice recognition software. Voice recognition is usually quite accurate but there are transcription errors that can and very often do occur. I apologize for any typographical errors that were not detected and corrected.     Earleen Newport, MD 02/03/17 [phone]

## 2017-02-03 NOTE — ED Notes (Signed)

## 2017-02-04 ENCOUNTER — Ambulatory Visit
Admission: RE | Admit: 2017-02-04 | Discharge: 2017-02-04 | Disposition: A | Discharge status: Home / Self Care | Payer: Medicaid Other | Source: Ambulatory Visit | Attending: Gastroenterology | Admitting: Gastroenterology

## 2017-02-04 DIAGNOSIS — K7031 Alcoholic cirrhosis of liver with ascites: Secondary | ICD-10-CM

## 2017-02-04 DIAGNOSIS — R188 Other ascites: Secondary | ICD-10-CM | POA: Diagnosis present

## 2017-02-04 LAB — BODY FLUID CELL COUNT WITH DIFFERENTIAL
Eos, Fluid: 0 %
Lymphs, Fluid: 15 %
Monocyte-Macrophage-Serous Fluid: 81 %
NEUTROPHIL FLUID: 4 %
Other Cells, Fluid: 0 %
WBC FLUID: 692 uL

## 2017-02-04 LAB — ALBUMIN, PLEURAL OR PERITONEAL FLUID

## 2017-02-04 LAB — PROTEIN, PLEURAL OR PERITONEAL FLUID: Total protein, fluid: <3 g/dL

## 2017-02-04 NOTE — Procedures (Signed)
Pre Procedural Dx: Symptomatic Ascites Post Procedural Dx: Same  Successful US guided paracentesis yielding 1.3 L of serous ascitic fluid. Sample sent to laboratory as requested.  EBL: None  Complications: None immediate  Jay Amelda Hapke, MD Pager #: [phone]   

## 2017-02-05 LAB — MISC LABCORP TEST (SEND OUT): Labcorp test code: 19588

## 2017-02-08 LAB — CYTOLOGY - NON PAP

## 2017-02-08 LAB — BODY FLUID CULTURE: CULTURE: NO GROWTH

## 2017-03-12 DIAGNOSIS — C220 Liver cell carcinoma: Secondary | ICD-10-CM | POA: Insufficient documentation

## 2017-04-14 ENCOUNTER — Other Ambulatory Visit: Payer: Self-pay | Admitting: Gastroenterology

## 2017-04-14 DIAGNOSIS — R188 Other ascites: Secondary | ICD-10-CM

## 2017-04-15 ENCOUNTER — Ambulatory Visit
Admission: RE | Admit: 2017-04-15 | Discharge: 2017-04-15 | Disposition: A | Discharge status: Home / Self Care | Payer: Medicaid Other | Source: Ambulatory Visit | Attending: Gastroenterology | Admitting: Gastroenterology

## 2017-04-15 DIAGNOSIS — R188 Other ascites: Secondary | ICD-10-CM | POA: Insufficient documentation

## 2017-05-19 ENCOUNTER — Ambulatory Visit: Admit: 2017-05-19 | Payer: Medicaid Other | Admitting: Internal Medicine

## 2017-05-19 SURGERY — ESOPHAGOGASTRODUODENOSCOPY (EGD) WITH PROPOFOL
Anesthesia: General

## 2017-09-27 ENCOUNTER — Other Ambulatory Visit: Payer: Self-pay | Admitting: Gastroenterology

## 2017-09-27 DIAGNOSIS — K7460 Unspecified cirrhosis of liver: Secondary | ICD-10-CM

## 2017-09-27 DIAGNOSIS — R1031 Right lower quadrant pain: Secondary | ICD-10-CM

## 2017-09-27 DIAGNOSIS — B182 Chronic viral hepatitis C: Secondary | ICD-10-CM

## 2017-09-27 DIAGNOSIS — C220 Liver cell carcinoma: Secondary | ICD-10-CM

## 2017-09-27 DIAGNOSIS — R1011 Right upper quadrant pain: Secondary | ICD-10-CM

## 2017-10-01 ENCOUNTER — Ambulatory Visit
Admission: RE | Admit: 2017-10-01 | Discharge: 2017-10-01 | Disposition: A | Discharge status: Home / Self Care | Payer: Medicaid Other | Source: Ambulatory Visit | Attending: Gastroenterology | Admitting: Gastroenterology

## 2017-10-01 DIAGNOSIS — C220 Liver cell carcinoma: Secondary | ICD-10-CM | POA: Insufficient documentation

## 2017-10-01 DIAGNOSIS — R1031 Right lower quadrant pain: Secondary | ICD-10-CM | POA: Diagnosis not present

## 2017-10-01 DIAGNOSIS — R1011 Right upper quadrant pain: Secondary | ICD-10-CM | POA: Insufficient documentation

## 2017-10-01 DIAGNOSIS — K766 Portal hypertension: Secondary | ICD-10-CM | POA: Insufficient documentation

## 2017-10-01 DIAGNOSIS — B182 Chronic viral hepatitis C: Secondary | ICD-10-CM | POA: Insufficient documentation

## 2017-10-01 DIAGNOSIS — K8020 Calculus of gallbladder without cholecystitis without obstruction: Secondary | ICD-10-CM | POA: Diagnosis not present

## 2017-10-01 DIAGNOSIS — K7460 Unspecified cirrhosis of liver: Secondary | ICD-10-CM | POA: Diagnosis not present

## 2017-10-01 DIAGNOSIS — K5730 Diverticulosis of large intestine without perforation or abscess without bleeding: Secondary | ICD-10-CM | POA: Diagnosis not present

## 2017-10-01 HISTORY — DX: Malignant (primary) neoplasm, unspecified: C80.1

## 2017-10-01 MED ORDER — IOPAMIDOL (ISOVUE-300) INJECTION 61%
100.0000 mL | Freq: Once | INTRAVENOUS | Status: AC | PRN
  Administered 2017-10-01: 100 mL via INTRAVENOUS

## 2017-11-23 ENCOUNTER — Other Ambulatory Visit: Payer: Self-pay | Admitting: Gastroenterology

## 2017-11-23 DIAGNOSIS — K7031 Alcoholic cirrhosis of liver with ascites: Secondary | ICD-10-CM

## 2017-11-23 NOTE — Discharge Instructions (Signed)
Paracentesis, Care After °Refer to this sheet in the next few weeks. These instructions provide you with information about caring for yourself after your procedure. Your health care provider may also give you more specific instructions. Your treatment has been planned according to current medical practices, but problems sometimes occur. Call your health care provider if you have any problems or questions after your procedure. °What can I expect after the procedure? °After your procedure, it is common to have a small amount of clear fluid coming from the puncture site. °Follow these instructions at home: °· Return to your normal activities as told by your health care provider. Ask your health care provider what activities are safe for you. °· Take over-the-counter and prescription medicines only as told by your health care provider. °· Do not take baths, swim, or use a hot tub until your health care provider approves. °· Follow instructions from your health care provider about: °? How to take care of your puncture site. °? When and how you should change your bandage (dressing). °? When you should remove your dressing. °· Check your puncture area every day signs of infection. Watch for: °? Redness, swelling, or pain. °? Fluid, blood, or pus. °· Keep all follow-up visits as told by your health care provider. This is important. °Contact a health care provider if: °· You have redness, swelling, or pain at your puncture site. °· You start to have more clear fluid coming from your puncture site. °· You have blood or pus coming from your puncture site. °· You have chills. °· You have a fever. °Get help right away if: °· You develop chest pain or shortness of breath. °· You develop increasing pain, discomfort, or swelling in your abdomen. °· You feel dizzy or light-headed or you pass out. °This information is not intended to replace advice given to you by your health care provider. Make sure you discuss any questions you  have with your health care provider. °Document Released: 08/28/2014 Document Revised: 09/19/2015 Document Reviewed: 06/26/2014 °Elsevier Interactive Patient Education © 2018 Elsevier Inc. ° °

## 2017-11-24 ENCOUNTER — Ambulatory Visit
Admission: RE | Admit: 2017-11-24 | Discharge: 2017-11-24 | Disposition: A | Discharge status: Home / Self Care | Payer: Medicaid Other | Source: Ambulatory Visit | Attending: Gastroenterology | Admitting: Gastroenterology

## 2017-11-24 DIAGNOSIS — K7031 Alcoholic cirrhosis of liver with ascites: Secondary | ICD-10-CM | POA: Diagnosis not present

## 2017-11-24 IMAGING — CR DG CHEST 2V
1 series · 2 of 2 positions shown · non-contrast
Comparison: Chest x-ray of December 24, 2016

CLINICAL DATA: Breathing difficulty, shortness of breath. History
of COPD.

EXAM:
CHEST  2 VIEW

[Series 1: dg chest 2 view · 0.14mm/px · 2 of 2 slices shown]
[im 1/2]
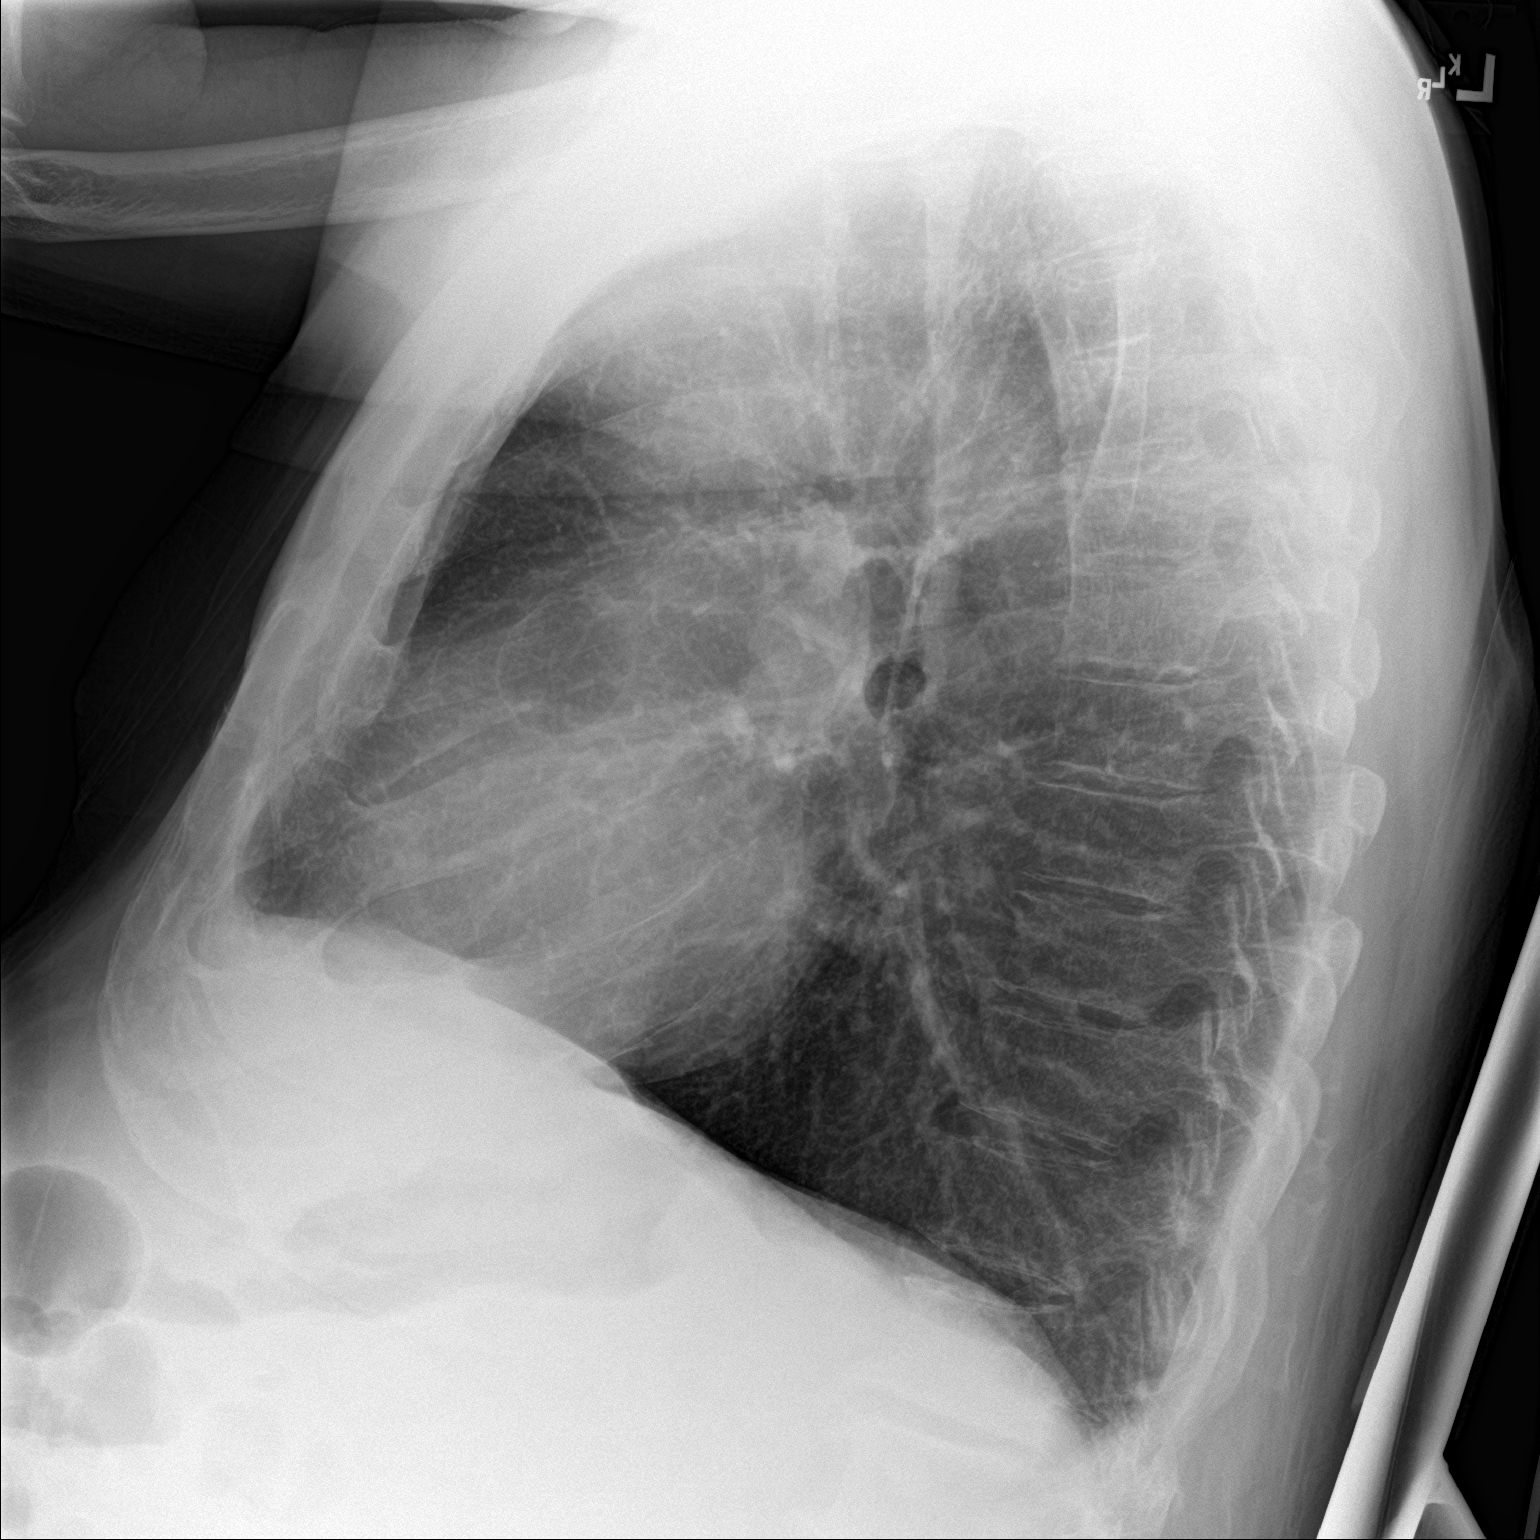
[im 2/2]
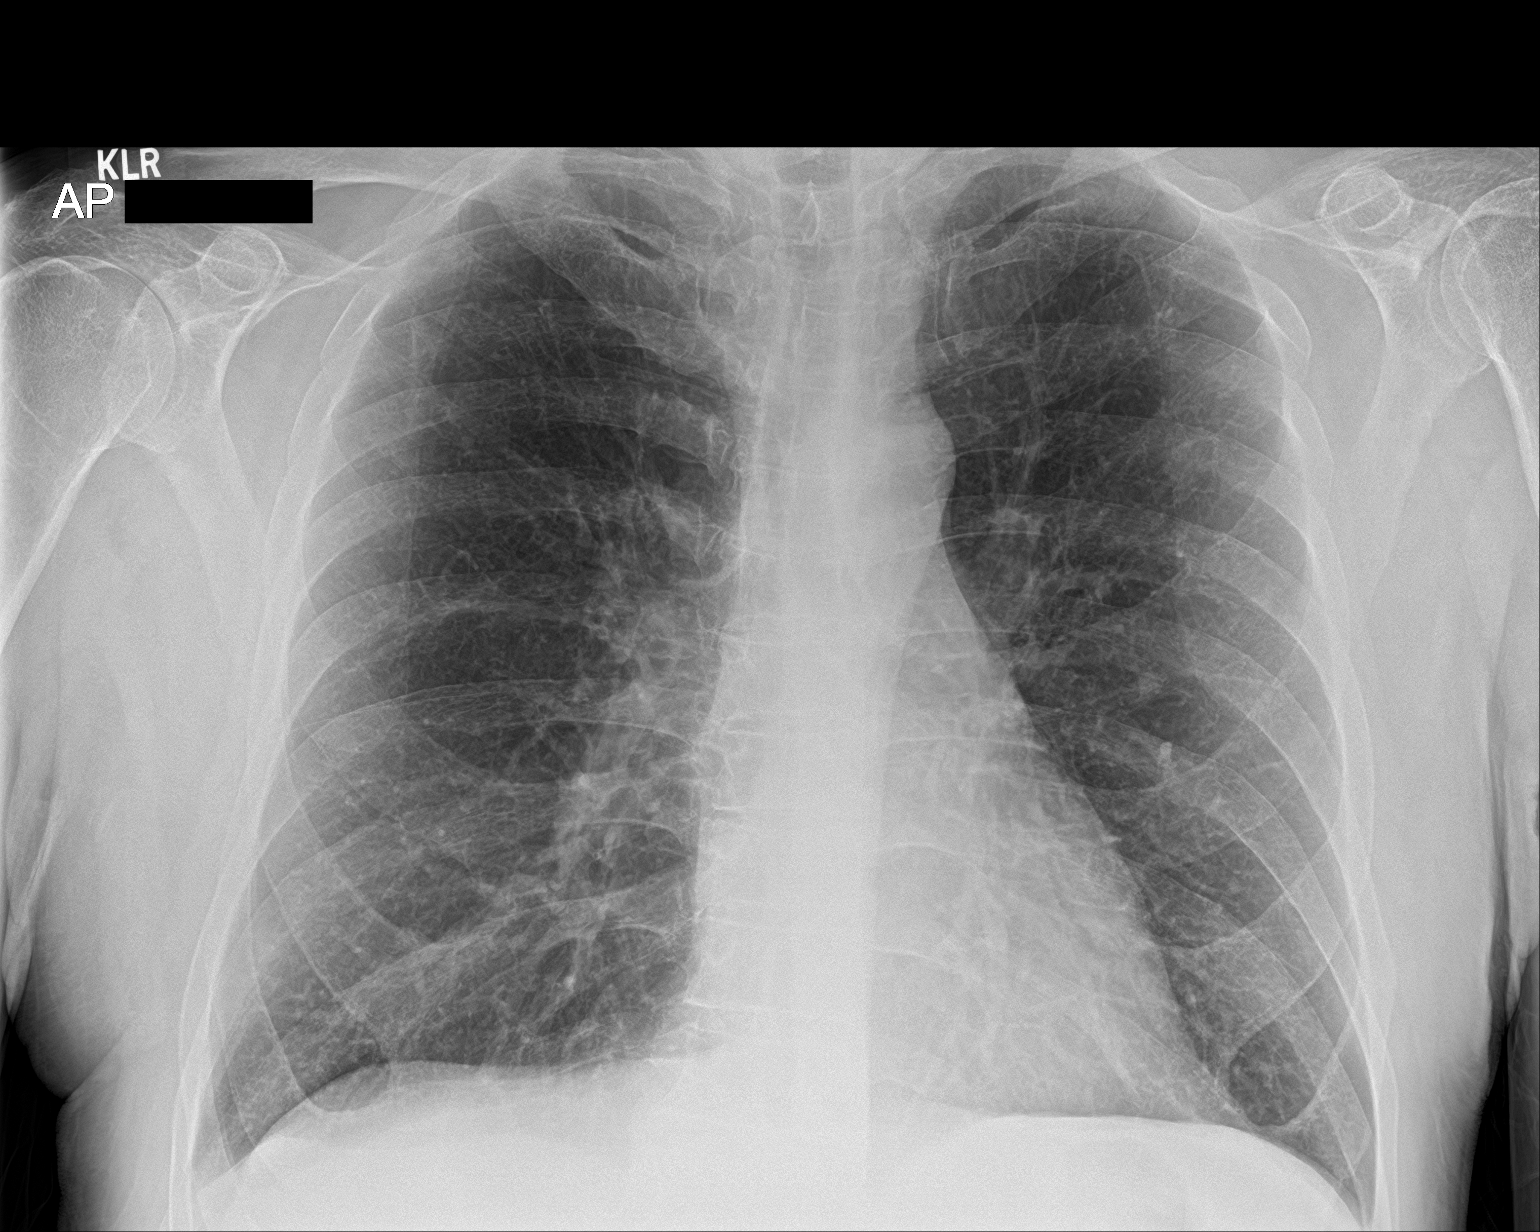

[2 of 2 positions shown; findings below may reference images not displayed]

FINDINGS: The lungs are mildly hyperinflated. There is no focal infiltrate.
There is no pleural effusion. The interstitial markings are coarse
though relatively stable. The heart and pulmonary vascularity are
normal. The bony thorax is unremarkable.
IMPRESSION: COPD. No pneumonia, CHF, nor other acute cardiopulmonary
abnormality.

## 2017-12-06 ENCOUNTER — Ambulatory Visit
Admission: RE | Admit: 2017-12-06 | Discharge: 2017-12-06 | Disposition: A | Discharge status: Home / Self Care | Payer: Medicaid Other | Source: Ambulatory Visit | Attending: Student in an Organized Health Care Education/Training Program | Admitting: Student in an Organized Health Care Education/Training Program

## 2017-12-06 ENCOUNTER — Encounter: Payer: Self-pay | Admitting: Student in an Organized Health Care Education/Training Program

## 2017-12-06 ENCOUNTER — Ambulatory Visit: Payer: Medicaid Other | Admitting: Student in an Organized Health Care Education/Training Program

## 2017-12-06 ENCOUNTER — Telehealth: Payer: Self-pay

## 2017-12-06 VITALS — BP 126/77 | HR 65 | Temp 98.2°F | Resp 16 | Ht 72.0 in | Wt 218.0 lb

## 2017-12-06 DIAGNOSIS — B182 Chronic viral hepatitis C: Secondary | ICD-10-CM | POA: Diagnosis not present

## 2017-12-06 DIAGNOSIS — M542 Cervicalgia: Secondary | ICD-10-CM

## 2017-12-06 DIAGNOSIS — C220 Liver cell carcinoma: Secondary | ICD-10-CM | POA: Diagnosis not present

## 2017-12-06 DIAGNOSIS — G894 Chronic pain syndrome: Secondary | ICD-10-CM | POA: Diagnosis not present

## 2017-12-06 DIAGNOSIS — G8929 Other chronic pain: Secondary | ICD-10-CM

## 2017-12-06 DIAGNOSIS — M4312 Spondylolisthesis, cervical region: Secondary | ICD-10-CM | POA: Insufficient documentation

## 2017-12-06 DIAGNOSIS — K7031 Alcoholic cirrhosis of liver with ascites: Secondary | ICD-10-CM | POA: Diagnosis not present

## 2017-12-06 DIAGNOSIS — M47812 Spondylosis without myelopathy or radiculopathy, cervical region: Secondary | ICD-10-CM | POA: Insufficient documentation

## 2017-12-06 DIAGNOSIS — M546 Pain in thoracic spine: Secondary | ICD-10-CM | POA: Diagnosis present

## 2017-12-06 DIAGNOSIS — J449 Chronic obstructive pulmonary disease, unspecified: Secondary | ICD-10-CM

## 2017-12-06 MED ORDER — DICLOFENAC SODIUM 75 MG PO TBEC: 75.0000 mg | DELAYED_RELEASE_TABLET | Freq: Two times a day (BID) | ORAL | 0 refills | Status: DC

## 2017-12-06 NOTE — Telephone Encounter (Signed)
Patient called and questioned what medication has been sent in to pharmacy.   Informed patient that Diclofenac had been sent to the pharmacy.  Patient states that Dr Holley Raring said he was going to send in pain medicine. Informed patient that pain medications are not prescribed on the first visit.  Patient was mumbling and hung up the phone.

## 2017-12-06 NOTE — Progress Notes (Signed)
Safety precautions to be maintained throughout the outpatient stay will include: orient to surroundings, keep bed in low position, maintain call bell within reach at all times, provide assistance with transfer out of bed and ambulation.  

## 2017-12-06 NOTE — Progress Notes (Signed)
Patient's Name: Bradley Dunlap  MRN: [id number]  Referring Provider: Barbaraann Boys, MD  DOB: [birth year]  PCP: Ellene Route  DOS: 12/06/2017  Note by: Gillis Santa, MD  Service setting: Ambulatory outpatient  Specialty: Interventional Pain Management  Location: [facility] (AMB) Pain Management Facility  Visit type: Initial Patient Evaluation  Patient type: New Patient   Primary Reason(s) for Visit: Encounter for initial evaluation of one or more chronic problems (new to examiner) potentially causing chronic pain, and posing a threat to normal musculoskeletal function. (Level of risk: High) CC: Abdominal Pain (s/p liver cancer); Back Pain (mid and lower ); Shoulder Pain (left ); Neck Pain (states sometimes it feels like his neck is broken ); and Arm Pain (left )  HPI  Bradley Dunlap is a 62 y.o. year old, male patient, who comes today to see [place] for the first time for an initial evaluation of his chronic pain. He has Ascites; Hematemesis; Peritonitis ([place]); Chronic pain syndrome; Alcoholic cirrhosis of liver with ascites ([place]); Anxiety state; Chronic obstructive pulmonary disease ([place]); Chronic viral hepatitis C ([place]); Hepatocellular carcinoma ([place]); and Neck pain on their problem list. Today he comes in for evaluation of his Abdominal Pain (s/p liver cancer); Back Pain (mid and lower ); Shoulder Pain (left ); Neck Pain (states sometimes it feels like his neck is broken ); and Arm Pain (left )  Pain Assessment: Location: Left, Right, Lower, Mid (see visit info for pain sites. ) Radiating: radiates everywhere  Onset: More than a month ago Duration: Chronic pain Quality: Discomfort, Aching(patient states the pain is weird and hurts all the time.  describes a sense of heaviness ) Severity: 8 /10 (subjective, self-reported pain score)  Note: Reported level is inconsistent with clinical observations. Clinically the patient looks like a 3/10 A 3/10 is viewed as "Moderate" and described as significantly  interfering with activities of daily living (ADL). It becomes difficult to feed, bathe, get dressed, get on and off the toilet or to perform personal hygiene functions. Difficult to get in and out of bed or a chair without assistance. Very distracting. With effort, it can be ignored when deeply involved in activities. Information on the proper use of the pain scale provided to the patient today. When using our objective Pain Scale, levels between 6 and 10/10 are said to belong in an emergency room, as it progressively worsens from a 6/10, described as severely limiting, requiring emergency care not usually available at an outpatient pain management facility. At a 6/10 level, communication becomes difficult and requires great effort. Assistance to reach the emergency department may be required. Facial flushing and profuse sweating along with potentially dangerous increases in heart rate and blood pressure will be evident. Effect on ADL: patient has difficulty walking and is unable to do his exercise.  Timing: Constant Modifying factors: nothing currently  BP: 126/77  HR: 65  Onset and Duration: Sudden and Started with accident around 1975 Cause of pain: Work related accident or event Severity: Getting worse Timing: Not influenced by the time of the day Aggravating Factors: Bending and Walking Alleviating Factors: Lying down, Medications and Nerve blocks Associated Problems: Fatigue, Numbness, Swelling, Tingling, Pain that wakes patient up and Pain that does not allow patient to sleep Quality of Pain: Annoying, Constant, Pressure-like, Sharp and Shooting Previous Examinations or Tests: MRI scan, Nerve block, X-rays and Neurosurgical evaluation Previous Treatments: Facet blocks and Narcotic medications  The patient comes into the clinics today for the first time for a chronic pain  management evaluation.   62 year old male with a history of chronic abdominal pain as well as axial neck, thoracic and  lumbar pain that has been present for many years.  Patient was previously seeing Dr. Petra Kuba for his pain management was on hydrocodone 5 mg up to 4 times a day as needed for his moderate to severe pain.  Patient does have a history of alcoholic cirrhosis, hepatitis C, hepatocellular carcinoma status post tumor ablation.  Patient does take Klonopin for his anxiety.  Patient is illiterate and a fairly poor historian.  Patient also has a history of COPD.  He is complaining of worsening neck pain.  He also endorses numbness and tingling in bilateral hands and bilateral feet.  Today I took the time to provide the patient with information regarding my pain practice. The patient was informed that my practice is divided into two sections: an interventional pain management section, as well as a completely separate and distinct medication management section. I explained that I have procedure days for my interventional therapies, and evaluation days for follow-ups and medication management. Because of the amount of documentation required during both, they are kept separated. This means that there is the possibility that he may be scheduled for a procedure on one day, and medication management the next. I have also informed him that because of staffing and facility limitations, I no longer take patients for medication management only. To illustrate the reasons for this, I gave the patient the example of surgeons, and how inappropriate it would be to refer a patient to his/her care, just to write for the post-surgical antibiotics on a surgery done by a different surgeon.   Because interventional pain management is my board-certified specialty, the patient was informed that joining my practice means that they are open to any and all interventional therapies. I made it clear that this does not mean that they will be forced to have any procedures done. What this means is that I believe interventional therapies to be essential  part of the diagnosis and proper management of chronic pain conditions. Therefore, patients not interested in these interventional alternatives will be better served under the care of a different practitioner.  The patient was also made aware of my Comprehensive Pain Management Safety Guidelines where by joining my practice, they limit all of their nerve blocks and joint injections to those done by our practice, for as long as we are retained to manage their care.   Historic Controlled Substance Pharmacotherapy Review  PMP and historical list of controlled substances:08/02/2019Tramadol Hcl 50 Mg Tablet 30.00 21.43 MME Medications: The patient did not bring the medication(s) to the appointment, as requested in our "New Patient Package" Pharmacodynamics: Desired effects: Analgesia: The patient reports <50% benefit. Reported improvement in function: The patient reports being unable to accomplish basic ADLs. Clinically meaningful improvement in function (CMIF): Sustained CMIF goals met Perceived effectiveness: Described as ineffective and would like to make some changes Undesirable effects: Side-effects or Adverse reactions: None reported Historical Monitoring: The patient  reports that he does not use drugs. List of all UDS Test(s): No results found for: MDMA, COCAINSCRNUR, [place], [place], CANNABQUANT, [place], [place] List of other Serum/Urine Drug Screening Test(s):  No results found for: AMPHSCRSER, BARBSCRSER, BENZOSCRSER, COCAINSCRSER, COCAINSCRNUR, PCPSCRSER, PCPQUANT, THCSCRSER, THCU, CANNABQUANT, OPIATESCRSER, OXYSCRSER, PROPOXSCRSER, ETH Historical Background Evaluation: [place] PMP: Six (6) year initial data search conducted.             [place] Department of public safety, offender search: Editor, commissioning  Information) Non-contributory Risk Assessment Profile: Aberrant behavior: None observed or detected today Risk factors for fatal opioid overdose: history of substance use disorder and None identified  today Fatal overdose hazard ratio (HR): Calculation deferred Non-fatal overdose hazard ratio (HR): Calculation deferred Risk of opioid abuse or dependence: 0.7-3.0% with doses ? 36 MME/day and 6.1-26% with doses ? 120 MME/day. Substance use disorder (SUD) risk level: See below Opioid risk tool (ORT) (Total Score): 6 Opioid Risk Tool - 12/06/17 1218      Family History of Substance Abuse   Alcohol  Positive Male    Illegal Drugs  Negative    Rx Drugs  Negative      Personal History of Substance Abuse   Alcohol  Positive Male or Male   does not drink anymore    Illegal Drugs  Negative    Rx Drugs  Negative      Psychological Disease   Psychological Disease  Negative    Depression  Negative      Total Score   Opioid Risk Tool Scoring  6    Opioid Risk Interpretation  Moderate Risk      ORT Scoring interpretation table:  Score <3 = Low Risk for SUD  Score between 4-7 = Moderate Risk for SUD  Score >8 = High Risk for Opioid Abuse   PHQ-2 Depression Scale:  Total score: 0  PHQ-2 Scoring interpretation table: (Score and probability of major depressive disorder)  Score 0 = No depression  Score 1 = 15.4% Probability  Score 2 = 21.1% Probability  Score 3 = 38.4% Probability  Score 4 = 45.5% Probability  Score 5 = 56.4% Probability  Score 6 = 78.6% Probability   PHQ-9 Depression Scale:  Total score: 0  PHQ-9 Scoring interpretation table:  Score 0-4 = No depression  Score 5-9 = Mild depression  Score 10-14 = Moderate depression  Score 15-19 = Moderately severe depression  Score 20-27 = Severe depression (2.4 times higher risk of SUD and 2.89 times higher risk of overuse)   Pharmacologic Plan: As per protocol, I have not taken over any controlled substance management, pending the results of ordered tests and/or consults.            Initial impression: Pending review of available data and ordered tests.  Meds   Current Outpatient Medications:  .  albuterol (PROVENTIL  HFA;VENTOLIN HFA) 108 (90 Base) MCG/ACT inhaler, Inhale 2 puffs into the lungs every 4 (four) hours as needed for wheezing., Disp: 1 Inhaler, Rfl: 0 .  albuterol (PROVENTIL) (2.5 MG/3ML) 0.083% nebulizer solution, Take 3 mLs (2.5 mg total) by nebulization every 4 (four) hours as needed for wheezing., Disp: 100 vial, Rfl: 0 .  ciprofloxacin (CIPRO) 500 MG tablet, Take 1 tablet (500 mg total) by mouth daily with breakfast., Disp: 30 tablet, Rfl: 0 .  clonazePAM (KLONOPIN) 0.5 MG tablet, Take 1 tablet (0.5 mg total) by mouth 2 (two) times daily as needed for anxiety., Disp: 20 tablet, Rfl: 0 .  esomeprazole (NEXIUM) 40 MG capsule, Take 40 mg by mouth daily., Disp: , Rfl:  .  Fluticasone-Salmeterol (ADVAIR DISKUS) 250-50 MCG/DOSE AEPB, Inhale 1 puff into the lungs 2 (two) times daily., Disp: 60 each, Rfl: 0 .  furosemide (LASIX) 40 MG tablet, Take 1 tablet (40 mg total) by mouth daily. (Patient taking differently: Take 20 mg by mouth daily. ), Disp: 30 tablet, Rfl: 1 .  omeprazole (PRILOSEC) 20 MG capsule, Take 20 mg by mouth daily., Disp: ,  Rfl:  .  propranolol (INDERAL) 10 MG tablet, Take 10 mg by mouth 2 (two) times daily., Disp: , Rfl:  .  spironolactone (ALDACTONE) 100 MG tablet, Take 1 tablet (100 mg total) by mouth daily., Disp: 30 tablet, Rfl: 0 .  tiotropium (SPIRIVA HANDIHALER) 18 MCG inhalation capsule, Place 1 capsule (18 mcg total) into inhaler and inhale daily., Disp: 30 capsule, Rfl: 0 .  traMADol (ULTRAM) 50 MG tablet, Take 1 tablet (50 mg total) by mouth every 6 (six) hours as needed for moderate pain., Disp: 20 tablet, Rfl: 0 .  traMADol (ULTRAM) 50 MG tablet, Take 50 mg by mouth every 6 (six) hours., Disp: , Rfl:  .  diclofenac (VOLTAREN) 75 MG EC tablet, Take 1 tablet (75 mg total) by mouth 2 (two) times daily after a meal., Disp: 60 tablet, Rfl: 0   ROS  Cardiovascular: High blood pressure Pulmonary or Respiratory: Difficulty blowing air out (Emphysema) and Smoking Neurological:  No reported neurological signs or symptoms such as seizures, abnormal skin sensations, urinary and/or fecal incontinence, being born with an abnormal open spine and/or a tethered spinal cord Review of Past Neurological Studies: No results found for this or any previous visit. Psychological-Psychiatric: No reported psychological or psychiatric signs or symptoms such as difficulty sleeping, anxiety, depression, delusions or hallucinations (schizophrenial), mood swings (bipolar disorders) or suicidal ideations or attempts Gastrointestinal: Inflamed liver (Hepatitis) Genitourinary: No reported renal or genitourinary signs or symptoms such as difficulty voiding or producing urine, peeing blood, non-functioning kidney, kidney stones, difficulty emptying the bladder, difficulty controlling the flow of urine, or chronic kidney disease Hematological: No reported hematological signs or symptoms such as prolonged bleeding, low or poor functioning platelets, bruising or bleeding easily, hereditary bleeding problems, low energy levels due to low hemoglobin or being anemic Endocrine: No reported endocrine signs or symptoms such as high or low blood sugar, rapid heart rate due to high thyroid levels, obesity or weight gain due to slow thyroid or thyroid disease Rheumatologic: No reported rheumatological signs and symptoms such as fatigue, joint pain, tenderness, swelling, redness, heat, stiffness, decreased range of motion, with or without associated rash Musculoskeletal: Negative for myasthenia gravis, muscular dystrophy, multiple sclerosis or malignant hyperthermia Work History: Disabled  Allergies  Bradley Dunlap is allergic to codeine and indomethacin.  Laboratory Chemistry  Inflammation Markers (CRP: Acute Phase) (ESR: Chronic Phase) No results found for: CRP, ESRSEDRATE, LATICACIDVEN                       Rheumatology Markers No results found for: RF, ANA, LABURIC, URICUR, LYMEIGGIGMAB, LYMEABIGMQN, HLAB27                       Renal Function Markers Lab Results  Component Value Date   BUN 8 02/03/2017   CREATININE 0.78 02/03/2017   GFRAA >60 02/03/2017   GFRNONAA >60 02/03/2017                             Hepatic Function Markers Lab Results  Component Value Date   AST 110 (H) 01/22/2017   ALT 77 (H) 01/22/2017   ALBUMIN 3.4 (L) 01/22/2017   ALKPHOS 89 01/22/2017   LIPASE 28 01/13/2017                        Electrolytes Lab Results  Component Value Date   NA 127 (L) 02/03/2017  K 4.4 02/03/2017   CL 97 (L) 02/03/2017   CALCIUM 9.3 02/03/2017                        Neuropathy Markers Lab Results  Component Value Date   HIV Non Reactive 01/03/2017                        Bone Pathology Markers No results found for: VD25OH, [id number], BP1025EN2, DP8242PN3, 25OHVITD1, 25OHVITD2, 25OHVITD3, TESTOFREE, TESTOSTERONE                       Coagulation Parameters Lab Results  Component Value Date   INR 1.09 01/22/2017   LABPROT 14.0 01/22/2017   APTT 25 12/30/2016   PLT 140 (L) 02/03/2017                        Cardiovascular Markers Lab Results  Component Value Date   TROPONINI <0.03 12/24/2016   HGB 14.0 02/03/2017   HCT 39.9 (L) 02/03/2017                         CA Markers No results found for: CEA, CA125, LABCA2                      Note: Lab results reviewed.  PFSH  Drug: Bradley Dunlap  reports that he does not use drugs. Alcohol:  reports that he does not drink alcohol. Tobacco:  reports that he has been smoking cigarettes. He has been smoking about 0.50 packs per day. He has quit using smokeless tobacco. Medical:  has a past medical history of Alcohol use, Anxiety, Ascites, Cancer ([place]), COPD (chronic obstructive pulmonary disease) ([place]), Hepatitis C, Hypertension, Liver cirrhosis ([place]), and Tobacco use. Family: family history includes Cancer in his mother; Heart attack in his father.  Past Surgical History:  Procedure Laterality Date  . BACK SURGERY    .  ESOPHAGOGASTRODUODENOSCOPY N/A 01/04/2017   Procedure: ESOPHAGOGASTRODUODENOSCOPY (EGD);  Surgeon: Lin Landsman, MD;  Location: [facility] ENDOSCOPY;  Service: Gastroenterology;  Laterality: N/A;  . ESOPHAGOGASTRODUODENOSCOPY (EGD) WITH PROPOFOL N/A 01/22/2017   Procedure: ESOPHAGOGASTRODUODENOSCOPY (EGD) WITH PROPOFOL;  Surgeon: [place], Benay Pike, MD;  Location: [facility] ENDOSCOPY;  Service: Gastroenterology;  Laterality: N/A;   Active Ambulatory Problems    Diagnosis Date Noted  . Ascites 12/24/2016  . Hematemesis 01/03/2017  . Peritonitis ([place]) 01/13/2017  . Chronic pain syndrome 02/24/2013  . Alcoholic cirrhosis of liver with ascites ([place]) 01/27/2017  . Anxiety state 07/27/2003  . Chronic obstructive pulmonary disease ([place]) 08/01/2003  . Chronic viral hepatitis C ([place]) 07/02/2015  . Hepatocellular carcinoma ([place]) 03/12/2017  . Neck pain 07/16/2005   Resolved Ambulatory Problems    Diagnosis Date Noted  . No Resolved Ambulatory Problems   Past Medical History:  Diagnosis Date  . Alcohol use   . Anxiety   . Cancer ([place])   . COPD (chronic obstructive pulmonary disease) ([place])   . Hepatitis C   . Hypertension   . Liver cirrhosis ([place])   . Tobacco use    Constitutional Exam  General appearance: Well nourished, well developed, and well hydrated. In no apparent acute distress Vitals:   12/06/17 1205  BP: 126/77  Pulse: 65  Resp: 16  Temp: 98.2 F (36.8 C)  TempSrc: Oral  SpO2: 97%  Weight: 218 lb (98.9 kg)  Height:  6' (1.829 m)   BMI Assessment: Estimated body mass index is 29.57 kg/m as calculated from the following:   Height as of this encounter: 6' (1.829 m).   Weight as of this encounter: 218 lb (98.9 kg).  BMI interpretation table: BMI level Category Range association with higher incidence of chronic pain  <18 kg/m2 Underweight   18.5-24.9 kg/m2 Ideal body weight   25-29.9 kg/m2 Overweight Increased incidence by 20%  30-34.9 kg/m2 Obese (Class I) Increased  incidence by 68%  35-39.9 kg/m2 Severe obesity (Class II) Increased incidence by 136%  >40 kg/m2 Extreme obesity (Class III) Increased incidence by 254%   Patient's current BMI Ideal Body weight  Body mass index is 29.57 kg/m. Ideal body weight: 77.6 kg (171 lb 1.2 oz) Adjusted ideal body weight: 86.1 kg (189 lb 13.5 oz)   BMI Readings from Last 4 Encounters:  12/06/17 29.57 kg/m  02/03/17 33.45 kg/m  01/22/17 33.45 kg/m  01/13/17 32.89 kg/m   Wt Readings from Last 4 Encounters:  12/06/17 218 lb (98.9 kg)  02/03/17 220 lb (99.8 kg)  01/22/17 220 lb (99.8 kg)  01/13/17 216 lb 4.8 oz (98.1 kg)  Psych/Mental status: Alert, oriented x 3 (person, place, & time)       Eyes: PERLA Respiratory: No evidence of acute respiratory distress  Cervical Spine Area Exam  Skin & Axial Inspection: No masses, redness, edema, swelling, or associated skin lesions Alignment: Symmetrical Functional ROM: Decreased ROM      Stability: No instability detected Muscle Tone/Strength: Functionally intact. No obvious neuro-muscular anomalies detected. Sensory (Neurological): Dermatomal pain pattern and articular Palpation: Complains of area being tender to palpation Positive provocative maneuver for for bilateral occipital neuralgia  Upper Extremity (UE) Exam    Side: Right upper extremity  Side: Left upper extremity  Skin & Extremity Inspection: Skin color, temperature, and hair growth are WNL. No peripheral edema or cyanosis. No masses, redness, swelling, asymmetry, or associated skin lesions. No contractures.  Skin & Extremity Inspection: Skin color, temperature, and hair growth are WNL. No peripheral edema or cyanosis. No masses, redness, swelling, asymmetry, or associated skin lesions. No contractures.  Functional ROM: Unrestricted ROM          Functional ROM: Unrestricted ROM          Muscle Tone/Strength: Functionally intact. No obvious neuro-muscular anomalies detected.  Muscle Tone/Strength:  Functionally intact. No obvious neuro-muscular anomalies detected.  Sensory (Neurological): Unimpaired          Sensory (Neurological): Unimpaired          Palpation: No palpable anomalies              Palpation: No palpable anomalies              Provocative Test(s):  Phalen's test: deferred Tinel's test: deferred Apley's scratch test (touch opposite shoulder):  Action 1 (Across chest): Decreased ROM Action 2 (Overhead): Decreased ROM Action 3 (LB reach): Decreased ROM   Provocative Test(s):  Phalen's test: deferred Tinel's test: deferred Apley's scratch test (touch opposite shoulder):  Action 1 (Across chest): Decreased ROM Action 2 (Overhead): Decreased ROM Action 3 (LB reach): Decreased ROM    Thoracic Spine Area Exam  Skin & Axial Inspection: No masses, redness, or swelling Alignment: Symmetrical Functional ROM: Decreased ROM Stability: No instability detected Muscle Tone/Strength: Functionally intact. No obvious neuro-muscular anomalies detected. Sensory (Neurological): Musculoskeletal pain pattern Muscle strength & Tone: No palpable anomalies  Lumbar Spine Area Exam  Skin & Axial Inspection: No  masses, redness, or swelling Alignment: Symmetrical Functional ROM: Decreased ROM       Stability: No instability detected Muscle Tone/Strength: Functionally intact. No obvious neuro-muscular anomalies detected. Sensory (Neurological): Musculoskeletal pain pattern and dermatomal Palpation: No palpable anomalies       Provocative Tests: Hyperextension/rotation test: (+) due to pain. Lumbar quadrant test (Kemp's test): (+) due to pain. Lateral bending test: (+) due to pain. Patrick's Maneuver: deferred today                   FABER test: deferred today                   S-I anterior distraction/compression test: deferred today         S-I lateral compression test: deferred today         S-I Thigh-thrust test: deferred today         S-I Gaenslen's test: deferred today           Gait & Posture Assessment  Ambulation: Unassisted Gait: Relatively normal for age and body habitus Posture: WNL   Lower Extremity Exam    Side: Right lower extremity  Side: Left lower extremity  Stability: No instability observed          Stability: No instability observed          Skin & Extremity Inspection: Skin color, temperature, and hair growth are WNL. No peripheral edema or cyanosis. No masses, redness, swelling, asymmetry, or associated skin lesions. No contractures.  Skin & Extremity Inspection: Skin color, temperature, and hair growth are WNL. No peripheral edema or cyanosis. No masses, redness, swelling, asymmetry, or associated skin lesions. No contractures.  Functional ROM: Unrestricted ROM                  Functional ROM: Unrestricted ROM                  Muscle Tone/Strength: Functionally intact. No obvious neuro-muscular anomalies detected.  Muscle Tone/Strength: Functionally intact. No obvious neuro-muscular anomalies detected.  Sensory (Neurological): Unimpaired  Sensory (Neurological): Unimpaired  Palpation: No palpable anomalies  Palpation: No palpable anomalies   Assessment  Primary Diagnosis & Pertinent Problem List: The primary encounter diagnosis was Chronic pain syndrome. Diagnoses of Hepatocellular carcinoma ([place]), Chronic hepatitis C without hepatic coma ([place]), Alcoholic cirrhosis of liver with ascites ([place]), Chronic obstructive pulmonary disease, unspecified COPD type ([place]), Cervicalgia, and Chronic bilateral thoracic back pain were also pertinent to this visit.  Visit Diagnosis (New problems to examiner): 1. Chronic pain syndrome   2. Hepatocellular carcinoma ([place])   3. Chronic hepatitis C without hepatic coma ([place])   4. Alcoholic cirrhosis of liver with ascites ([place])   5. Chronic obstructive pulmonary disease, unspecified COPD type ([place])   6. Cervicalgia   7. Chronic bilateral thoracic back pain    Plan of Care (Initial workup plan)  Note: Please be  advised that as per protocol, today's visit has been an evaluation only. We have not taken over the patient's controlled substance management.  Ordered Lab-work, Procedure(s), Referral(s), & Consult(s): Orders Placed This Encounter  Procedures  . DG Cervical Spine With Flex & Extend  . DG Thoracic Spine 2 View  . Compliance Drug Analysis, Ur   Pharmacotherapy (current): Medications ordered:  Meds ordered this encounter  Medications  . diclofenac (VOLTAREN) 75 MG EC tablet    Sig: Take 1 tablet (75 mg total) by mouth 2 (two) times daily after a meal.  Dispense:  60 tablet    Refill:  0   Medications administered during this visit: Satchel A. Gueye had no medications administered during this visit.   Pharmacological management options:  Opioid Analgesics: The patient was informed that there is no guarantee that he would be a candidate for opioid analgesics. The decision will be made following CDC guidelines. This decision will be based on the results of diagnostic studies, as well as Mr. Slatten risk profile.   Membrane stabilizer: To be determined at a later time  Muscle relaxant: To be determined at a later time  NSAID: To be determined at a later time  Other analgesic(s): To be determined at a later time   Interventional management options: Mr. Dewey was informed that there is no guarantee that he would be a candidate for interventional therapies. The decision will be based on the results of diagnostic studies, as well as Mr. Gallon risk profile.  Procedure(s) under consideration:  Cervical facet medial branch nerve blocks Cervical ESI Thoracic facet medial branch nerve blocks Lumbar facet medial branch nerve blocks Lumbar ESI   Provider-requested follow-up: Return in about 29 days (around 01/04/2018) for Medication Management, After Imaging.  Future Appointments  Date Time Provider Center  01/04/2018 11:30 AM Gillis Santa, MD [facility] None    Primary Care  Physician: Ellene Route Location: [facility] Note by: Gillis Santa, M.D, Date: 12/06/2017; Time: 1:49 PM  There are no Patient Instructions on file for this visit.

## 2017-12-06 NOTE — Telephone Encounter (Signed)
Patient called back again wanting to speak to Dr Holley Raring.  Informed patient that Dr Holley Raring was gone for the day and could I help him with something.  States that Dr Holley Raring was supposed to call him in pain medication.  INformed patient that our clinic does not order pain medication on the first visit.  Patient states to cancel his appoinment because he didn't want to come back.  Annette notified to cancel appointment.

## 2017-12-11 LAB — COMPLIANCE DRUG ANALYSIS, UR

## 2018-01-04 ENCOUNTER — Ambulatory Visit: Payer: Medicaid Other | Admitting: Student in an Organized Health Care Education/Training Program

## 2018-01-20 ENCOUNTER — Encounter: Payer: Self-pay | Admitting: *Deleted

## 2018-01-21 ENCOUNTER — Encounter: Payer: Self-pay | Admitting: Anesthesiology

## 2018-01-21 ENCOUNTER — Encounter
Admission: RE | Disposition: A | Discharge status: Home / Self Care | Payer: Self-pay | Source: Ambulatory Visit | Attending: Gastroenterology

## 2018-01-21 ENCOUNTER — Ambulatory Visit
Admission: RE | Admit: 2018-01-21 | Discharge: 2018-01-21 | Disposition: A | Discharge status: Home / Self Care | Payer: Medicaid Other | Source: Ambulatory Visit | Attending: Gastroenterology | Admitting: Gastroenterology

## 2018-01-21 ENCOUNTER — Ambulatory Visit: Payer: Medicaid Other | Admitting: Certified Registered Nurse Anesthetist

## 2018-01-21 DIAGNOSIS — D122 Benign neoplasm of ascending colon: Secondary | ICD-10-CM | POA: Diagnosis not present

## 2018-01-21 DIAGNOSIS — F419 Anxiety disorder, unspecified: Secondary | ICD-10-CM | POA: Insufficient documentation

## 2018-01-21 DIAGNOSIS — D124 Benign neoplasm of descending colon: Secondary | ICD-10-CM | POA: Insufficient documentation

## 2018-01-21 DIAGNOSIS — D123 Benign neoplasm of transverse colon: Secondary | ICD-10-CM | POA: Diagnosis not present

## 2018-01-21 DIAGNOSIS — B182 Chronic viral hepatitis C: Secondary | ICD-10-CM | POA: Diagnosis not present

## 2018-01-21 DIAGNOSIS — I739 Peripheral vascular disease, unspecified: Secondary | ICD-10-CM | POA: Insufficient documentation

## 2018-01-21 DIAGNOSIS — K766 Portal hypertension: Secondary | ICD-10-CM | POA: Diagnosis not present

## 2018-01-21 DIAGNOSIS — K219 Gastro-esophageal reflux disease without esophagitis: Secondary | ICD-10-CM | POA: Diagnosis not present

## 2018-01-21 DIAGNOSIS — Z79899 Other long term (current) drug therapy: Secondary | ICD-10-CM | POA: Insufficient documentation

## 2018-01-21 DIAGNOSIS — G8929 Other chronic pain: Secondary | ICD-10-CM | POA: Insufficient documentation

## 2018-01-21 DIAGNOSIS — Z1211 Encounter for screening for malignant neoplasm of colon: Secondary | ICD-10-CM | POA: Insufficient documentation

## 2018-01-21 DIAGNOSIS — M1990 Unspecified osteoarthritis, unspecified site: Secondary | ICD-10-CM | POA: Insufficient documentation

## 2018-01-21 DIAGNOSIS — Z8505 Personal history of malignant neoplasm of liver: Secondary | ICD-10-CM | POA: Diagnosis not present

## 2018-01-21 DIAGNOSIS — Z7951 Long term (current) use of inhaled steroids: Secondary | ICD-10-CM | POA: Diagnosis not present

## 2018-01-21 DIAGNOSIS — K5730 Diverticulosis of large intestine without perforation or abscess without bleeding: Secondary | ICD-10-CM | POA: Diagnosis not present

## 2018-01-21 DIAGNOSIS — Z86010 Personal history of colonic polyps: Secondary | ICD-10-CM | POA: Insufficient documentation

## 2018-01-21 DIAGNOSIS — J449 Chronic obstructive pulmonary disease, unspecified: Secondary | ICD-10-CM | POA: Insufficient documentation

## 2018-01-21 DIAGNOSIS — I10 Essential (primary) hypertension: Secondary | ICD-10-CM | POA: Diagnosis not present

## 2018-01-21 DIAGNOSIS — K7030 Alcoholic cirrhosis of liver without ascites: Secondary | ICD-10-CM | POA: Diagnosis not present

## 2018-01-21 HISTORY — PX: COLONOSCOPY WITH PROPOFOL: SHX5780

## 2018-01-21 HISTORY — DX: Anemia, unspecified: D64.9

## 2018-01-21 HISTORY — DX: Peripheral vascular disease, unspecified: I73.9

## 2018-01-21 HISTORY — DX: Unspecified osteoarthritis, unspecified site: M19.90

## 2018-01-21 HISTORY — DX: Chronic viral hepatitis C: B18.2

## 2018-01-21 HISTORY — DX: Esophageal varices without bleeding: I85.00

## 2018-01-21 HISTORY — DX: Illiteracy and low-level literacy: Z55.0

## 2018-01-21 HISTORY — DX: Gastro-esophageal reflux disease without esophagitis: K21.9

## 2018-01-21 HISTORY — DX: Alcohol abuse, uncomplicated: F10.10

## 2018-01-21 LAB — CBC WITH DIFFERENTIAL/PLATELET
BASOS ABS: 0.1 10*3/uL (ref 0–0.1)
BASOS PCT: 1 %
EOS ABS: 0.2 10*3/uL (ref 0–0.7)
Eosinophils Relative: 3 %
HEMATOCRIT: 42.5 % (ref 40.0–52.0)
HEMOGLOBIN: 15.1 g/dL (ref 13.0–18.0)
Lymphocytes Relative: 17 %
Lymphs Abs: 1.2 10*3/uL (ref 1.0–3.6)
MCH: 33.5 pg (ref 26.0–34.0)
MCHC: 35.6 g/dL (ref 32.0–36.0)
MCV: 94.2 fL (ref 80.0–100.0)
MONO ABS: 0.8 10*3/uL (ref 0.2–1.0)
MONOS PCT: 12 %
NEUTROS ABS: 4.7 10*3/uL (ref 1.4–6.5)
NEUTROS PCT: 67 %
PLATELETS: 95 10*3/uL — AB (ref 150–440)
RBC: 4.52 MIL/uL (ref 4.40–5.90)
RDW: 14.7 % — ABNORMAL HIGH (ref 11.5–14.5)
WBC: 6.9 10*3/uL (ref 3.8–10.6)

## 2018-01-21 LAB — PROTIME-INR
INR: 1.11
Prothrombin Time: 14.2 seconds (ref 11.4–15.2)

## 2018-01-21 SURGERY — COLONOSCOPY WITH PROPOFOL
Anesthesia: General

## 2018-01-21 MED ORDER — PROPOFOL 10 MG/ML IV BOLUS
INTRAVENOUS | Status: DC | PRN
  Administered 2018-01-21: 70 mg via INTRAVENOUS
  Administered 2018-01-21: 30 mg via INTRAVENOUS

## 2018-01-21 MED ORDER — FENTANYL CITRATE (PF) 100 MCG/2ML IJ SOLN
INTRAMUSCULAR | Status: DC | PRN
  Administered 2018-01-21: 100 ug via INTRAVENOUS

## 2018-01-21 MED ORDER — IPRATROPIUM-ALBUTEROL 0.5-2.5 (3) MG/3ML IN SOLN
RESPIRATORY_TRACT | Status: AC
  Administered 2018-01-21: 3 mL via RESPIRATORY_TRACT
  Filled 2018-01-21: qty 3

## 2018-01-21 MED ORDER — FENTANYL CITRATE (PF) 100 MCG/2ML IJ SOLN
INTRAMUSCULAR | Status: AC
  Filled 2018-01-21: qty 2

## 2018-01-21 MED ORDER — PROPOFOL 500 MG/50ML IV EMUL
INTRAVENOUS | Status: DC | PRN
  Administered 2018-01-21: 160 ug/kg/min via INTRAVENOUS

## 2018-01-21 MED ORDER — IPRATROPIUM-ALBUTEROL 0.5-2.5 (3) MG/3ML IN SOLN
3.0000 mL | Freq: Four times a day (QID) | RESPIRATORY_TRACT | Status: DC
  Administered 2018-01-21: 3 mL via RESPIRATORY_TRACT

## 2018-01-21 MED ORDER — SODIUM CHLORIDE 0.9 % IV SOLN
INTRAVENOUS | Status: DC
  Administered 2018-01-21: 12:00:00 via INTRAVENOUS

## 2018-01-21 NOTE — Op Note (Addendum)
[facility] Patient Name: Bradley Dunlap Procedure Date: 01/21/2018 12:42 PM MRN: [id number] Account #: [id number] Date of Birth: 07-Feb-1956 Admit Type: Outpatient Age: 62 Room: [facility] ENDO ROOM 1 Gender: Male Note Status: Finalized Procedure:            Colonoscopy Indications:          Personal history of colonic polyps Providers:            [name], MD Referring MD:         No Local Md, MD (Referring MD) Medicines:            Monitored Anesthesia Care Complications:        No immediate complications. Procedure:            Pre-Anesthesia Assessment:                       - ASA Grade Assessment: III - A patient with severe                        systemic disease.                       After obtaining informed consent, the colonoscope was                        passed under direct vision. Throughout the procedure,                        the patient's blood pressure, pulse, and oxygen                        saturations were monitored continuously. The                        Colonoscope was introduced through the anus and                        advanced to the the cecum, identified by appendiceal                        orifice and ileocecal valve. The colonoscopy was                        performed without difficulty. The patient tolerated the                        procedure well. The quality of the bowel preparation                        was good. Findings:      Multiple small-mouthed diverticula were found in the sigmoid colon and       descending colon.      A 4 mm polyp was found in the descending colon. The polyp was sessile.       The polyp was removed with a cold snare. Resection and retrieval were       complete.      Two sessile polyps were found in the transverse colon. The polyps were 2       mm in size. These polyps were removed with a cold biopsy forceps.       Resection  and retrieval were complete.      A 4 mm polyp was  found in the proximal ascending colon. The polyp was       sessile. The polyp was removed with a cold snare. Resection and       retrieval were complete.      A 2 mm polyp was found in the hepatic flexure. The polyp was sessile.       The polyp was removed with a cold biopsy forceps. Resection and       retrieval were complete.      All sites of polypectomy were carefully observed and good hemostasis was       achieved.      The digital rectal exam was normal. Impression:           - Diverticulosis in the sigmoid colon and in the                        descending colon.                       - One 4 mm polyp in the descending colon, removed with                        a cold snare. Resected and retrieved.                       - Two 2 mm polyps in the transverse colon, removed with                        a cold biopsy forceps. Resected and retrieved.                       - One 4 mm polyp in the proximal ascending colon,                        removed with a cold snare. Resected and retrieved.                       - One 2 mm polyp at the hepatic flexure, removed with a                        cold biopsy forceps. Resected and retrieved. Recommendation:       - Discharge patient to home.                       - Await pathology results.                       - Clear liquid diet today.                       - Full liquid diet for 1 day, then advance as tolerated                        to soft diet for 3 days. Procedure Code(s):    --- Professional ---                       [phone], Colonoscopy, flexible; with removal of tumor(s),  polyp(s), or other lesion(s) by snare technique                       [id number], 59, Colonoscopy, flexible; with biopsy, single                        or multiple Diagnosis Code(s):    --- Professional ---                       D12.4, Benign neoplasm of descending colon                       D12.2, Benign neoplasm of ascending colon                        D12.3, Benign neoplasm of transverse colon (hepatic                        flexure or splenic flexure)                       Z86.010, Personal history of colonic polyps                       K57.30, Diverticulosis of large intestine without                        perforation or abscess without bleeding CPT copyright 2017 American Medical Association. All rights reserved. The codes documented in this report are preliminary and upon coder review may  be revised to meet current compliance requirements. [name], MD 01/21/2018 1:29:03 PM This report has been signed electronically. Number of Addenda: 0 Note Initiated On: 01/21/2018 12:42 PM Scope Withdrawal Time: 0 hours 20 minutes 33 seconds  Total Procedure Duration: 0 hours 34 minutes 50 seconds       [facility]

## 2018-01-21 NOTE — Anesthesia Postprocedure Evaluation (Signed)
Anesthesia Post Note  Patient: MASAJI BILLUPS  Procedure(s) Performed: COLONOSCOPY WITH PROPOFOL (N/A )  Patient location during evaluation: Endoscopy Anesthesia Type: General Level of consciousness: awake and alert Pain management: pain level controlled Vital Signs Assessment: post-procedure vital signs reviewed and stable Respiratory status: spontaneous breathing, nonlabored ventilation, respiratory function stable and patient connected to nasal cannula oxygen Cardiovascular status: blood pressure returned to baseline and stable Postop Assessment: no apparent nausea or vomiting Anesthetic complications: no     Last Vitals:  Vitals:   01/21/18 1349 01/21/18 1359  BP: (!) 146/130 118/78  Pulse: 75 70  Resp: 18 (!) 21  Temp:    SpO2: 96% 95%    Last Pain:  Vitals:   01/21/18 1349  TempSrc:   PainSc: 10-Worst pain ever                 Safwan,Isaack Preble S

## 2018-01-21 NOTE — Anesthesia Preprocedure Evaluation (Signed)
Anesthesia Evaluation  Patient identified by MRN, date of birth, ID band Patient awake    Reviewed: Allergy & Precautions, NPO status , Patient's Chart, lab work & pertinent test results, reviewed documented beta blocker date and time   Airway Mallampati: III  TM Distance: >3 FB     Dental  (+) Chipped, Poor Dentition, Dental Advisory Given, Missing   Pulmonary COPD, Current Smoker,           Cardiovascular hypertension, Pt. on medications and Pt. on home beta blockers + Peripheral Vascular Disease       Neuro/Psych PSYCHIATRIC DISORDERS Anxiety    GI/Hepatic GERD  ,(+) Hepatitis -, C  Endo/Other    Renal/GU      Musculoskeletal  (+) Arthritis ,   Abdominal   Peds  Hematology  (+) anemia ,   Anesthesia Other Findings ETOH. 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 Smokes. Took inhalers this am.  Reproductive/Obstetrics                             Anesthesia Physical Anesthesia Plan  ASA: III  Anesthesia Plan: General   Post-op Pain Management:    Induction: Intravenous  PONV Risk Score and Plan:   Airway Management Planned:   Additional Equipment:   Intra-op Plan:   Post-operative Plan:   Informed Consent: I have reviewed the patients History and Physical, chart, labs and discussed the procedure including the risks, benefits and alternatives for the proposed anesthesia with the patient or authorized representative who has indicated his/her understanding and acceptance.     Plan Discussed with: CRNA  Anesthesia Plan Comments:         Anesthesia Quick Evaluation

## 2018-01-21 NOTE — H&P (Addendum)
Outpatient short stay form Pre-procedure 01/21/2018 11:53 AM Bradley Sails MD  Primary Physician: Dr. Barbaraann Boys  Reason for visit: Colonoscopy  History of present illness: Patient is a 62 year old male presenting today for a colonoscopy.  He has personal history of adenomatous colon polyps.  It is of note patient does have chronic abdominal pain status post cryotherapy ablation of a hepatocellular carcinoma.  He has a meld score of 7, child Turcotte Pugh score of 8 to be.  He had the ablation of the lesion in his liver done on March fifth 2019.  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  CT scan on 10/01/2017 in follow-up of his treatment findings of impression showing an ablation defect now seen at the site of a small hypervascular right hepatic lobe mass demonstrated on previous examination.  There is no evidence of recurrent or metastatic carcinoma.  There is also finding of hepatic cirrhosis with portal venous hypertension.  There is evidence of cholelithiasis without evidence of cholecystitis.  There is also colonic diverticulosis out evidence of diverticulitis.  He has a left pelvic kidney incidentally noted.  Is no evidence of ascites at that time.  Laboratories today indicate a white cell count of 6.9 differential showing 67% neutrophils.  His platelet count was 95.  His pro time 14.2 with an INR of 1.11.  He tolerated his prep well.  He takes no aspirin or blood thinning agent.    Current Facility-Administered Medications:  .  0.9 %  sodium chloride infusion, , Intravenous, Continuous, Bradley Sails, MD  Medications Prior to Admission  Medication Sig Dispense Refill Last Dose  . albuterol (PROVENTIL HFA;VENTOLIN HFA) 108 (90 Base) MCG/ACT inhaler Inhale 2 puffs into the lungs every 4 (four) hours as needed for wheezing. 1 Inhaler 0 01/21/2018 at Unknown time  . albuterol (PROVENTIL) (2.5 MG/3ML) 0.083% nebulizer solution Take 3 mLs  (2.5 mg total) by nebulization every 4 (four) hours as needed for wheezing. 100 vial 0 01/21/2018 at Unknown time  . clonazePAM (KLONOPIN) 0.5 MG tablet Take 1 tablet (0.5 mg total) by mouth 2 (two) times daily as needed for anxiety. 20 tablet 0 01/20/2018 at Unknown time  . esomeprazole (NEXIUM) 40 MG capsule Take 40 mg by mouth daily.   01/20/2018 at Unknown time  . Fluticasone-Salmeterol (ADVAIR DISKUS) 250-50 MCG/DOSE AEPB Inhale 1 puff into the lungs 2 (two) times daily. 60 each 0 01/21/2018 at Unknown time  . omeprazole (PRILOSEC) 20 MG capsule Take 20 mg by mouth daily.   01/20/2018 at Unknown time  . propranolol (INDERAL) 10 MG tablet Take 10 mg by mouth 2 (two) times daily.   01/20/2018 at Unknown time  . spironolactone (ALDACTONE) 100 MG tablet Take 1 tablet (100 mg total) by mouth daily. 30 tablet 0 01/20/2018 at Unknown time  . tiotropium (SPIRIVA HANDIHALER) 18 MCG inhalation capsule Place 1 capsule (18 mcg total) into inhaler and inhale daily. 30 capsule 0 01/21/2018 at Unknown time  . ciprofloxacin (CIPRO) 500 MG tablet Take 1 tablet (500 mg total) by mouth daily with breakfast. (Patient not taking: Reported on 01/21/2018) 30 tablet 0 Not Taking at Unknown time  . diclofenac (VOLTAREN) 75 MG EC tablet Take 1 tablet (75 mg total) by mouth 2 (two) times daily after a meal. (Patient not taking: Reported on 01/21/2018) 60 tablet 0 Not Taking at Unknown time  . furosemide (LASIX) 40 MG tablet Take 1 tablet (40 mg total) by mouth  daily. (Patient taking differently: Take 20 mg by mouth daily. ) 30 tablet 1 Taking  . traMADol (ULTRAM) 50 MG tablet Take 1 tablet (50 mg total) by mouth every 6 (six) hours as needed for moderate pain. (Patient not taking: Reported on 01/21/2018) 20 tablet 0 Not Taking at Unknown time     Allergies  Allergen Reactions  . Codeine Itching and Other (See Comments)    Other reaction(s): Other (See Comments) Back bone itching    . Indomethacin Itching    Other reaction(s):  ITCHING     Past Medical History:  Diagnosis Date  . Alcohol abuse   . Alcohol use   . Anemia   . Anxiety   . Arthritis   . Ascites   . Cancer [facility])    liver cancer  . Chronic hepatitis C ([place])   . COPD (chronic obstructive pulmonary disease) ([place])   . Esophageal varices ([place])   . GERD (gastroesophageal reflux disease)   . Hepatitis C   . Hypertension   . Illiterate    cannot read or write  . Liver cirrhosis ([place])   . Peripheral vascular disease ([place])   . Tobacco use     Review of systems:      Physical Exam    Heart and lungs: Good rate and rhythm without rub or gallop, lungs are bilaterally clear.    HEENT: Normocephalic atraumatic eyes are anicteric    Other:    Pertinant exam for procedure: Soft, mild generalized diffuse tenderness more so on the right side of the abdomen.  This consistent with his history of chronic abdominal discomfort.  Bowel sounds are positive normoactive    Planned proceedures: Colonoscopy and indicated procedures. I have discussed the risks benefits and complications of procedures to include not limited to bleeding, infection, perforation and the risk of sedation and the patient wishes to proceed.    Bradley Sails, MD Gastroenterology 01/21/2018  11:53 AM

## 2018-01-21 NOTE — Anesthesia Post-op Follow-up Note (Signed)
Anesthesia QCDR form completed.        

## 2018-01-21 NOTE — Anesthesia Procedure Notes (Signed)
Performed by: Wister Hoefle, CRNA Pre-anesthesia Checklist: Patient identified, Emergency Drugs available, Suction available, Patient being monitored and Timeout performed Patient Re-evaluated:Patient Re-evaluated prior to induction Oxygen Delivery Method: Nasal cannula Induction Type: IV induction       

## 2018-01-21 NOTE — Transfer of Care (Signed)
Immediate Anesthesia Transfer of Care Note  Patient: Bradley Dunlap  Procedure(s) Performed: COLONOSCOPY WITH PROPOFOL (N/A )  Patient Location: PACU  Anesthesia Type:General  Level of Consciousness: awake, alert  and oriented  Airway & Oxygen Therapy: Patient Spontanous Breathing  Post-op Assessment: Report given to RN and Post -op Vital signs reviewed and stable  Post vital signs: Reviewed and stable  Last Vitals:  Vitals Value Taken Time  BP    Temp    Pulse    Resp    SpO2      Last Pain:  Vitals:   01/21/18 1103  TempSrc: Tympanic  PainSc: 8          Complications: No apparent anesthesia complications

## 2018-01-24 ENCOUNTER — Encounter: Payer: Self-pay | Admitting: Gastroenterology

## 2018-01-25 LAB — SURGICAL PATHOLOGY

## 2018-05-04 ENCOUNTER — Other Ambulatory Visit: Payer: Self-pay | Admitting: Gastroenterology

## 2018-05-04 ENCOUNTER — Other Ambulatory Visit (HOSPITAL_COMMUNITY): Payer: Self-pay | Admitting: Gastroenterology

## 2018-05-04 DIAGNOSIS — C220 Liver cell carcinoma: Secondary | ICD-10-CM

## 2018-05-04 DIAGNOSIS — R1084 Generalized abdominal pain: Secondary | ICD-10-CM

## 2018-05-10 ENCOUNTER — Ambulatory Visit
Admission: RE | Admit: 2018-05-10 | Discharge: 2018-05-10 | Disposition: A | Discharge status: Home / Self Care | Payer: Medicaid Other | Source: Ambulatory Visit | Attending: Gastroenterology | Admitting: Gastroenterology

## 2018-05-10 DIAGNOSIS — R1084 Generalized abdominal pain: Secondary | ICD-10-CM | POA: Diagnosis not present

## 2018-05-10 DIAGNOSIS — C220 Liver cell carcinoma: Secondary | ICD-10-CM | POA: Insufficient documentation

## 2018-05-10 MED ORDER — IOHEXOL 300 MG/ML  SOLN
100.0000 mL | Freq: Once | INTRAMUSCULAR | Status: AC | PRN
  Administered 2018-05-10: 100 mL via INTRAVENOUS

## 2018-06-27 ENCOUNTER — Other Ambulatory Visit: Payer: Self-pay | Admitting: Gastroenterology

## 2018-06-27 ENCOUNTER — Ambulatory Visit
Admission: RE | Admit: 2018-06-27 | Discharge: 2018-06-27 | Disposition: A | Discharge status: Home / Self Care | Payer: Medicaid Other | Attending: Gastroenterology | Admitting: Gastroenterology

## 2018-06-27 ENCOUNTER — Other Ambulatory Visit: Payer: Self-pay

## 2018-06-27 ENCOUNTER — Ambulatory Visit
Admission: RE | Admit: 2018-06-27 | Discharge: 2018-06-27 | Disposition: A | Discharge status: Home / Self Care | Payer: Medicaid Other | Source: Ambulatory Visit | Attending: Gastroenterology | Admitting: Gastroenterology

## 2018-06-27 DIAGNOSIS — R0602 Shortness of breath: Secondary | ICD-10-CM | POA: Diagnosis present

## 2018-06-27 DIAGNOSIS — F17200 Nicotine dependence, unspecified, uncomplicated: Secondary | ICD-10-CM

## 2018-06-27 DIAGNOSIS — R05 Cough: Secondary | ICD-10-CM

## 2018-06-27 DIAGNOSIS — R053 Chronic cough: Secondary | ICD-10-CM

## 2018-07-08 ENCOUNTER — Emergency Department: Payer: Medicaid Other

## 2018-07-08 ENCOUNTER — Inpatient Hospital Stay
Admission: EM | Admit: 2018-07-08 | Discharge: 2018-07-18 | DRG: 432 | Disposition: A | Discharge status: Other Acute Inpatient Hospital | Payer: Medicaid Other | Attending: Internal Medicine | Admitting: Internal Medicine

## 2018-07-08 ENCOUNTER — Other Ambulatory Visit: Payer: Self-pay

## 2018-07-08 DIAGNOSIS — Z716 Tobacco abuse counseling: Secondary | ICD-10-CM | POA: Diagnosis not present

## 2018-07-08 DIAGNOSIS — R0781 Pleurodynia: Secondary | ICD-10-CM | POA: Diagnosis present

## 2018-07-08 DIAGNOSIS — J449 Chronic obstructive pulmonary disease, unspecified: Secondary | ICD-10-CM | POA: Diagnosis present

## 2018-07-08 DIAGNOSIS — Z79899 Other long term (current) drug therapy: Secondary | ICD-10-CM | POA: Diagnosis not present

## 2018-07-08 DIAGNOSIS — K3189 Other diseases of stomach and duodenum: Secondary | ICD-10-CM | POA: Diagnosis present

## 2018-07-08 DIAGNOSIS — Z0189 Encounter for other specified special examinations: Secondary | ICD-10-CM

## 2018-07-08 DIAGNOSIS — M1990 Unspecified osteoarthritis, unspecified site: Secondary | ICD-10-CM | POA: Diagnosis present

## 2018-07-08 DIAGNOSIS — D696 Thrombocytopenia, unspecified: Secondary | ICD-10-CM | POA: Diagnosis present

## 2018-07-08 DIAGNOSIS — I8511 Secondary esophageal varices with bleeding: Secondary | ICD-10-CM | POA: Diagnosis not present

## 2018-07-08 DIAGNOSIS — K56609 Unspecified intestinal obstruction, unspecified as to partial versus complete obstruction: Secondary | ICD-10-CM | POA: Diagnosis not present

## 2018-07-08 DIAGNOSIS — K7460 Unspecified cirrhosis of liver: Secondary | ICD-10-CM | POA: Diagnosis not present

## 2018-07-08 DIAGNOSIS — K766 Portal hypertension: Secondary | ICD-10-CM | POA: Diagnosis present

## 2018-07-08 DIAGNOSIS — Z7951 Long term (current) use of inhaled steroids: Secondary | ICD-10-CM | POA: Diagnosis not present

## 2018-07-08 DIAGNOSIS — K219 Gastro-esophageal reflux disease without esophagitis: Secondary | ICD-10-CM | POA: Diagnosis present

## 2018-07-08 DIAGNOSIS — Z885 Allergy status to narcotic agent status: Secondary | ICD-10-CM

## 2018-07-08 DIAGNOSIS — G894 Chronic pain syndrome: Secondary | ICD-10-CM | POA: Diagnosis present

## 2018-07-08 DIAGNOSIS — Z79891 Long term (current) use of opiate analgesic: Secondary | ICD-10-CM | POA: Diagnosis not present

## 2018-07-08 DIAGNOSIS — Z8505 Personal history of malignant neoplasm of liver: Secondary | ICD-10-CM

## 2018-07-08 DIAGNOSIS — F419 Anxiety disorder, unspecified: Secondary | ICD-10-CM | POA: Diagnosis present

## 2018-07-08 DIAGNOSIS — Z888 Allergy status to other drugs, medicaments and biological substances status: Secondary | ICD-10-CM | POA: Diagnosis not present

## 2018-07-08 DIAGNOSIS — K7031 Alcoholic cirrhosis of liver with ascites: Secondary | ICD-10-CM | POA: Diagnosis present

## 2018-07-08 DIAGNOSIS — R042 Hemoptysis: Secondary | ICD-10-CM

## 2018-07-08 DIAGNOSIS — K56600 Partial intestinal obstruction, unspecified as to cause: Secondary | ICD-10-CM | POA: Diagnosis not present

## 2018-07-08 DIAGNOSIS — B182 Chronic viral hepatitis C: Secondary | ICD-10-CM | POA: Diagnosis not present

## 2018-07-08 DIAGNOSIS — K920 Hematemesis: Secondary | ICD-10-CM | POA: Diagnosis not present

## 2018-07-08 DIAGNOSIS — F1010 Alcohol abuse, uncomplicated: Secondary | ICD-10-CM | POA: Diagnosis present

## 2018-07-08 DIAGNOSIS — F17210 Nicotine dependence, cigarettes, uncomplicated: Secondary | ICD-10-CM | POA: Diagnosis present

## 2018-07-08 DIAGNOSIS — I739 Peripheral vascular disease, unspecified: Secondary | ICD-10-CM | POA: Diagnosis present

## 2018-07-08 DIAGNOSIS — E669 Obesity, unspecified: Secondary | ICD-10-CM | POA: Diagnosis present

## 2018-07-08 DIAGNOSIS — K5909 Other constipation: Secondary | ICD-10-CM | POA: Diagnosis present

## 2018-07-08 DIAGNOSIS — Z7189 Other specified counseling: Secondary | ICD-10-CM | POA: Diagnosis not present

## 2018-07-08 DIAGNOSIS — I10 Essential (primary) hypertension: Secondary | ICD-10-CM | POA: Diagnosis present

## 2018-07-08 DIAGNOSIS — D649 Anemia, unspecified: Secondary | ICD-10-CM | POA: Diagnosis present

## 2018-07-08 DIAGNOSIS — Z515 Encounter for palliative care: Secondary | ICD-10-CM | POA: Diagnosis not present

## 2018-07-08 DIAGNOSIS — R52 Pain, unspecified: Secondary | ICD-10-CM

## 2018-07-08 DIAGNOSIS — R012 Other cardiac sounds: Secondary | ICD-10-CM | POA: Diagnosis not present

## 2018-07-08 DIAGNOSIS — I8501 Esophageal varices with bleeding: Secondary | ICD-10-CM | POA: Diagnosis present

## 2018-07-08 DIAGNOSIS — R188 Other ascites: Secondary | ICD-10-CM | POA: Diagnosis not present

## 2018-07-08 DIAGNOSIS — K5900 Constipation, unspecified: Secondary | ICD-10-CM

## 2018-07-08 DIAGNOSIS — D6859 Other primary thrombophilia: Secondary | ICD-10-CM | POA: Diagnosis present

## 2018-07-08 DIAGNOSIS — Z6829 Body mass index (BMI) 29.0-29.9, adult: Secondary | ICD-10-CM

## 2018-07-08 LAB — PREPARE RBC (CROSSMATCH)

## 2018-07-08 LAB — CBC WITH DIFFERENTIAL/PLATELET
Abs Immature Granulocytes: 0.03 10*3/uL (ref 0.00–0.07)
Basophils Absolute: 0.1 10*3/uL (ref 0.0–0.1)
Basophils Relative: 1 %
EOS ABS: 0.4 10*3/uL (ref 0.0–0.5)
Eosinophils Relative: 5 %
HCT: 38.3 % — ABNORMAL LOW (ref 39.0–52.0)
Hemoglobin: 13.3 g/dL (ref 13.0–17.0)
Immature Granulocytes: 0 %
Lymphocytes Relative: 17 %
Lymphs Abs: 1.4 10*3/uL (ref 0.7–4.0)
MCH: 33.1 pg (ref 26.0–34.0)
MCHC: 34.7 g/dL (ref 30.0–36.0)
MCV: 95.3 fL (ref 80.0–100.0)
Monocytes Absolute: 1.2 10*3/uL — ABNORMAL HIGH (ref 0.1–1.0)
Monocytes Relative: 14 %
Neutro Abs: 5 10*3/uL (ref 1.7–7.7)
Neutrophils Relative %: 63 %
Platelets: 93 10*3/uL — ABNORMAL LOW (ref 150–400)
RBC: 4.02 MIL/uL — ABNORMAL LOW (ref 4.22–5.81)
RDW: 15.7 % — AB (ref 11.5–15.5)
WBC: 8.1 10*3/uL (ref 4.0–10.5)
nRBC: 0.2 % (ref 0.0–0.2)

## 2018-07-08 LAB — COMPREHENSIVE METABOLIC PANEL
ALBUMIN: 3 g/dL — AB (ref 3.5–5.0)
ALT: 108 U/L — ABNORMAL HIGH (ref 0–44)
AST: 151 U/L — AB (ref 15–41)
Alkaline Phosphatase: 85 U/L (ref 38–126)
Anion gap: 7 (ref 5–15)
BUN: 12 mg/dL (ref 8–23)
CO2: 24 mmol/L (ref 22–32)
Calcium: 8.6 mg/dL — ABNORMAL LOW (ref 8.9–10.3)
Chloride: 101 mmol/L (ref 98–111)
Creatinine, Ser: 0.93 mg/dL (ref 0.61–1.24)
GFR calc non Af Amer: >60 mL/min (ref >60)
Glucose, Bld: 129 mg/dL — ABNORMAL HIGH (ref 70–99)
Potassium: 4 mmol/L (ref 3.5–5.1)
Sodium: 132 mmol/L — ABNORMAL LOW (ref 135–145)
Total Bilirubin: 2.8 mg/dL — ABNORMAL HIGH (ref 0.3–1.2)
Total Protein: 7.2 g/dL (ref 6.5–8.1)

## 2018-07-08 LAB — TYPE AND SCREEN
ABO/RH(D): O NEG
Antibody Screen: NEGATIVE

## 2018-07-08 LAB — HEMOGLOBIN AND HEMATOCRIT, BLOOD
HCT: 36 % — ABNORMAL LOW (ref 39.0–52.0)
Hemoglobin: 12.3 g/dL — ABNORMAL LOW (ref 13.0–17.0)

## 2018-07-08 MED ORDER — ONDANSETRON HCL 4 MG/2ML IJ SOLN
4.0000 mg | Freq: Four times a day (QID) | INTRAMUSCULAR | Status: DC | PRN
  Administered 2018-07-09 – 2018-07-18 (×11): 4 mg via INTRAVENOUS
  Filled 2018-07-08 (×10): qty 2

## 2018-07-08 MED ORDER — ONDANSETRON HCL 4 MG PO TABS: 4.0000 mg | ORAL_TABLET | Freq: Four times a day (QID) | ORAL | Status: DC | PRN

## 2018-07-08 MED ORDER — PANTOPRAZOLE SODIUM 40 MG IV SOLR
40.0000 mg | Freq: Two times a day (BID) | INTRAVENOUS | Status: DC
  Administered 2018-07-12 – 2018-07-18 (×13): 40 mg via INTRAVENOUS
  Filled 2018-07-08 (×13): qty 40

## 2018-07-08 MED ORDER — TRAMADOL HCL 50 MG PO TABS
50.0000 mg | ORAL_TABLET | Freq: Four times a day (QID) | ORAL | Status: DC | PRN
  Administered 2018-07-09 – 2018-07-13 (×6): 50 mg via ORAL
  Filled 2018-07-08 (×7): qty 1

## 2018-07-08 MED ORDER — TIOTROPIUM BROMIDE MONOHYDRATE 18 MCG IN CAPS
18.0000 ug | ORAL_CAPSULE | Freq: Every day | RESPIRATORY_TRACT | Status: DC
  Administered 2018-07-08 – 2018-07-18 (×11): 18 ug via RESPIRATORY_TRACT
  Filled 2018-07-08 (×4): qty 5

## 2018-07-08 MED ORDER — ALBUTEROL SULFATE (2.5 MG/3ML) 0.083% IN NEBU
2.5000 mg | INHALATION_SOLUTION | RESPIRATORY_TRACT | Status: DC | PRN
  Administered 2018-07-09: 2.5 mg via RESPIRATORY_TRACT
  Filled 2018-07-08: qty 3

## 2018-07-08 MED ORDER — OCTREOTIDE LOAD VIA INFUSION
50.0000 ug | Freq: Once | INTRAVENOUS | Status: AC
  Administered 2018-07-08: 50 ug via INTRAVENOUS
  Filled 2018-07-08: qty 25

## 2018-07-08 MED ORDER — CLONAZEPAM 0.5 MG PO TABS
0.5000 mg | ORAL_TABLET | Freq: Two times a day (BID) | ORAL | Status: DC | PRN
  Administered 2018-07-09 – 2018-07-15 (×6): 0.5 mg via ORAL
  Filled 2018-07-08 (×8): qty 1

## 2018-07-08 MED ORDER — MOMETASONE FURO-FORMOTEROL FUM 200-5 MCG/ACT IN AERO
2.0000 | INHALATION_SPRAY | Freq: Two times a day (BID) | RESPIRATORY_TRACT | Status: DC
  Administered 2018-07-08 – 2018-07-18 (×19): 2 via RESPIRATORY_TRACT
  Filled 2018-07-08: qty 8.8

## 2018-07-08 MED ORDER — IOHEXOL 300 MG/ML  SOLN
75.0000 mL | Freq: Once | INTRAMUSCULAR | Status: AC | PRN
  Administered 2018-07-08: 75 mL via INTRAVENOUS

## 2018-07-08 MED ORDER — SODIUM CHLORIDE 0.9 % IV SOLN
8.0000 mg/h | INTRAVENOUS | Status: AC
  Administered 2018-07-08 – 2018-07-11 (×5): 8 mg/h via INTRAVENOUS
  Filled 2018-07-08 (×6): qty 80

## 2018-07-08 MED ORDER — PROPRANOLOL HCL 10 MG PO TABS: 10.0000 mg | ORAL_TABLET | Freq: Two times a day (BID) | ORAL | Status: DC

## 2018-07-08 MED ORDER — SODIUM CHLORIDE 0.9 % IV SOLN
10.0000 mL/h | Freq: Once | INTRAVENOUS | Status: AC
  Administered 2018-07-09: 12:00:00 via INTRAVENOUS

## 2018-07-08 MED ORDER — SODIUM CHLORIDE 0.9 % IV SOLN
50.0000 ug/h | INTRAVENOUS | Status: DC
  Administered 2018-07-08 – 2018-07-12 (×7): 50 ug/h via INTRAVENOUS
  Filled 2018-07-08 (×16): qty 1

## 2018-07-08 MED ORDER — SODIUM CHLORIDE 0.9 % IV SOLN
80.0000 mg | Freq: Once | INTRAVENOUS | Status: AC
  Administered 2018-07-08: 80 mg via INTRAVENOUS
  Filled 2018-07-08: qty 80

## 2018-07-08 MED ORDER — SODIUM CHLORIDE 0.9 % IV SOLN
INTRAVENOUS | Status: DC
  Administered 2018-07-08 – 2018-07-09 (×2): via INTRAVENOUS

## 2018-07-08 NOTE — ED Notes (Signed)
Pt assisted to toilet, pt tried to urinate but couldn't get it out. Pt waked back to bed with EDT. Pt had no complaints at this time and is in bed resting.

## 2018-07-08 NOTE — ED Notes (Signed)
Patient transported to CT 

## 2018-07-08 NOTE — H&P (Signed)
[facility] NAME: Bradley Dunlap    MR#:  [id number]  DATE OF BIRTH:  January 31, 1956  DATE OF ADMISSION:  07/08/2018  PRIMARY CARE PHYSICIAN: Ellene Route   REQUESTING/REFERRING PHYSICIAN: Rip Harbour MD  CHIEF COMPLAINT:  Vomiting of blood  HISTORY OF PRESENT ILLNESS:  Bradley Dunlap  is a 63 y.o. male with a known history of alcohol abuse, hepatitis C, liver cancer, COPD liver cirrhosis with ascites status post paracentesis few months ago is presenting to the ED with a chief complaint of vomiting and coughing of bright red blood.  Some abdominal discomfort is present associated with nausea but denies any black tarry stool.  Hemoglobin dropped from 15.1 in the past to 13.3.  Patient endorses that he stopped drinking alcohol few months ago but still continues to smoke.  No family members at bedside.  PAST MEDICAL HISTORY:   Past Medical History:  Diagnosis Date  . Alcohol abuse   . Alcohol use   . Anemia   . Anxiety   . Arthritis   . Ascites   . Cancer [facility])    liver cancer  . Chronic hepatitis C ([place])   . COPD (chronic obstructive pulmonary disease) ([place])   . Esophageal varices ([place])   . GERD (gastroesophageal reflux disease)   . Hepatitis C   . Hypertension   . Illiterate    cannot read or write  . Liver cirrhosis ([place])   . Peripheral vascular disease ([place])   . Tobacco use     PAST SURGICAL HISTOIRY:   Past Surgical History:  Procedure Laterality Date  . BACK SURGERY    . COLONOSCOPY WITH PROPOFOL N/A 01/21/2018   Procedure: COLONOSCOPY WITH PROPOFOL;  Surgeon: [name], MD;  Location[facility] END[facility]COPY;  Service: Endoscopy;  Laterality: N/A;  . ESOPHAGOGASTRODUODENOSCOPY N/A 01/04/2017   Procedure: ESOPHAGOGASTRODUODENOSCOPY (EGD);  Surgeon: Lin Landsman, MD;  Location: [facility] ENDOSCOPY;  Service: Gastroenterology;  Laterality: N/A;  . ESOPHAGOGASTRODUODENOSCOPY (EGD) WITH PROPOFOL N/A 01/22/2017    Procedure: ESOPHAGOGASTRODUODENOSCOPY (EGD) WITH PROPOFOL;  Surgeon: Toledo, Benay Pike, MD;  Location: [facility] ENDOSCOPY;  Service: Gastroenterology;  Laterality: N/A;  . PARACENTESIS    . SPINE SURGERY     lower lumbar fused    SOCIAL HISTORY:   Social History   Tobacco Use  . Smoking status: Current Every Day Smoker    Packs/day: 0.50    Types: Cigarettes  . Smokeless tobacco: Former Network engineer Use Topics  . Alcohol use: No    Comment: 2-3 months    FAMILY HISTORY:   Family History  Problem Relation Age of Onset  . Cancer Mother   . Heart attack Father     DRUG ALLERGIES:   Allergies  Allergen Reactions  . Codeine Itching and Other (See Comments)    Other reaction(s): Other (See Comments) Back bone itching    . Indomethacin Itching    Other reaction(s): ITCHING    REVIEW OF SYSTEMS:  CONSTITUTIONAL: No fever, fatigue or weakness.  EYES: No blurred or double vision.  EARS, NOSE, AND THROAT: No tinnitus or ear pain.  RESPIRATORY: No cough, shortness of breath, wheezing or hemoptysis.  CARDIOVASCULAR: No chest pain, orthopnea, edema.  GASTROINTESTINAL: No nausea, vomiting, diarrhea, endorses diffuse abdominal discomfort and hematemesis GENITOURINARY: No dysuria, hematuria.  ENDOCRINE: No polyuria, nocturia,  HEMATOLOGY: No anemia, easy bruising or bleeding SKIN: No rash or lesion. MUSCULOSKELETAL: No joint pain or arthritis.   NEUROLOGIC: No  tingling, numbness, weakness.  PSYCHIATRY: No anxiety or depression.   MEDICATIONS AT HOME:   Prior to Admission medications   Medication Sig Start Date End Date Taking? Authorizing Provider  albuterol (PROVENTIL HFA;VENTOLIN HFA) 108 (90 Base) MCG/ACT inhaler Inhale 2 puffs into the lungs every 4 (four) hours as needed for wheezing. 01/14/17  Yes Leslye Peer, Richard, MD  albuterol (PROVENTIL) (2.5 MG/3ML) 0.083% nebulizer solution Take 3 mLs (2.5 mg total) by nebulization every 4 (four) hours as needed for wheezing.  12/27/16  Yes Wieting, Richard, MD  clonazePAM (KLONOPIN) 0.5 MG tablet Take 1 tablet (0.5 mg total) by mouth 2 (two) times daily as needed for anxiety. 01/14/17  Yes Wieting, Richard, MD  esomeprazole (NEXIUM) 40 MG capsule Take 40 mg by mouth daily. 11/26/17  Yes [provider]  Fluticasone-Salmeterol (ADVAIR DISKUS) 250-50 MCG/DOSE AEPB Inhale 1 puff into the lungs 2 (two) times daily. 01/14/17  Yes Wieting, Richard, MD  furosemide (LASIX) 40 MG tablet Take 1 tablet (40 mg total) by mouth daily. Patient taking differently: Take 20 mg by mouth daily.  01/05/17 07/08/18 Yes Demetrios Loll, MD  propranolol (INDERAL) 10 MG tablet Take 10 mg by mouth 2 (two) times daily. 11/04/17  Yes [provider]  traMADol (ULTRAM) 50 MG tablet Take 1 tablet (50 mg total) by mouth every 6 (six) hours as needed for moderate pain. 01/14/17  Yes Wieting, Richard, MD  tiotropium (SPIRIVA HANDIHALER) 18 MCG inhalation capsule Place 1 capsule (18 mcg total) into inhaler and inhale daily. 01/14/17 01/21/18  Loletha Grayer, MD      VITAL SIGNS:  Blood pressure (!) 108/59, pulse 73, temperature 98.5 F (36.9 C), temperature source Oral, resp. rate 17, height 6\' 2"  (1.88 m), weight 103.4 kg, SpO2 94 %.  PHYSICAL EXAMINATION:  GENERAL:  63 y.o.-year-old patient lying in the bed with no acute distress.  EYES: Pupils equal, round, reactive to light and accommodation. No scleral icterus. Extraocular muscles intact.  HEENT: Head atraumatic, normocephalic. Oropharynx and nasopharynx clear.  NECK:  Supple, no jugular venous distention. No thyroid enlargement, no tenderness.  LUNGS: Normal breath sounds bilaterally, no wheezing, rales,rhonchi or crepitation. No use of accessory muscles of respiration.  CARDIOVASCULAR: S1, S2 normal. No murmurs, rubs, or gallops.  ABDOMEN: Soft, nontender, distended. Bowel sounds present.  EXTREMITIES: No pedal edema, cyanosis, or clubbing.  NEUROLOGIC: Awake, alert and oriented x3.  Sensation intact. Gait not checked.  PSYCHIATRIC: The patient is alert and oriented x 3.  SKIN: No obvious rash, lesion, or ulcer.   LABORATORY PANEL:   CBC Recent Labs  Lab 07/08/18 1723  WBC 8.1  HGB 13.3  HCT 38.3*  PLT 93*   ------------------------------------------------------------------------------------------------------------------  Chemistries  Recent Labs  Lab 07/08/18 1723  NA 132*  K 4.0  CL 101  CO2 24  GLUCOSE 129*  BUN 12  CREATININE 0.93  CALCIUM 8.6*  AST 151*  ALT 108*  ALKPHOS 85  BILITOT 2.8*   ------------------------------------------------------------------------------------------------------------------  Cardiac Enzymes No results for input(s): TROPONINI in the last 168 hours. ------------------------------------------------------------------------------------------------------------------  RADIOLOGY:  Ct Chest W Contrast  Result Date: 07/08/2018 CLINICAL DATA:  Hemoptysis.  History of COPD and cirrhosis. EXAM: CT CHEST WITH CONTRAST TECHNIQUE: Multidetector CT imaging of the chest was performed during intravenous contrast administration. CONTRAST:  6mL OMNIPAQUE IOHEXOL 300 MG/ML  SOLN COMPARISON:  Chest radiograph July 08, 2018 and CT chest December 07, 2011 and CT abdomen and pelvis in May 10, 2018 FINDINGS: CARDIOVASCULAR: Heart size is normal. No  pericardial effusion. Moderate coronary artery calcifications. Thoracic aorta is normal course and caliber, mild intimal thickening and calcific atherosclerosis. No central pulmonary embolism though not tailored for evaluation. MEDIASTINUM/NODES: No mediastinal mass. No lymphadenopathy by CT size criteria. LUNGS/PLEURA: Tracheobronchial tree is patent, no pneumothorax. Bronchial wall thickening. RIGHT middle lobe bulla. Mild centrilobular emphysema. Stable stellate scarring LEFT lower lobe. UPPER ABDOMEN: Nodular cirrhotic liver with partially imaged splenomegaly and moderate ascites with  mesenteric densities most compatible with varicosities. Subcentimeter layering cholelithiasis with wall thickening seen with portal hypertension. MUSCULOSKELETAL: Nonacute. Osteopenia with multiple old mild thoracic compression fractures. IMPRESSION: 1. Bronchial wall thickening seen with bronchitis or reactive airway disease. No pneumonia. 2. Similar cirrhosis and evidence of portal hypertension including moderate ascites. Aortic Atherosclerosis (ICD10-I70.0) and Emphysema (ICD10-J43.9). Electronically Signed   By: Elon Alas M.D.   On: 07/08/2018 18:16   Dg Chest Portable 1 View  Result Date: 07/08/2018 CLINICAL DATA:  Hemoptysis EXAM: PORTABLE CHEST 1 VIEW COMPARISON:  June 27, 2018 FINDINGS: Lungs are mildly hyperexpanded without edema or consolidation. There are scattered areas of mild scarring. Heart size and pulmonary vascularity are normal. No adenopathy. No bone lesions. IMPRESSION: Lungs hyperexpanded with scattered areas of mild scarring. No edema or consolidation. Stable cardiac silhouette. No appreciable change from most recent study. Electronically Signed   By: Lowella Grip III M.D.   On: 07/08/2018 17:45    EKG:   Orders placed or performed during the hospital encounter of 07/08/18  . EKG 12-Lead  . EKG 12-Lead  . ED EKG  . ED EKG    IMPRESSION AND PLAN:    #Hematemesis with history of liver cirrhosis, hepatitis C and liver cancer Admit to MedSurg unit Hemoglobin is at 13.3 dropped from 15.1 in the past Monitor hemoglobin hematocrit closely and transfuse as needed N.p.o. after midnight for possible EGD in a.m. GI consult placed to Dr. Marius Ditch and notified via secure chart she is aware of the consult  Protonix drip and octreotide drip Holding propranolol in view of hypotension  #Liver cirrhosis with ascites Patient may need paracentesis during this admission if he become symptomatic  #Liver cancer with history of hepatitis C  #COPD no exacerbation provide  breathing treatments with albuterol as needed Continue home medication Advair and Spiriva  #Chronic anxiety disorder Klonopin will be continued which is his home medication  #Tobacco abuse disorder-counseled patient to quit smoking for 5 minutes.  He verbalized understanding of the plan.  He is not sure about the nicotine patch at this time   All the records are reviewed and case discussed with ED provider. Management plans discussed with the patient, family and they are in agreement.  CODE STATUS: fc   TOTAL TIME TAKING CARE OF THIS PATIENT: 45 minutes.   Note: This dictation was prepared with Dragon dictation along with smaller phrase technology. Any transcriptional errors that result from this process are unintentional.  Nicholes Mango M.D on 07/08/2018 at 9:10 PM  Between 7am to 6pm - Pager - [phone]  After 6pm go to [email or web address] - password [email or web address] [facility]  [place] [facility]  Office  [phone]  CC: Primary care physician; Ellene Route

## 2018-07-08 NOTE — Progress Notes (Signed)
Family Meeting Note  Advance Directive:yes  Today a meeting took place with the Patient.    The following clinical team members were present during this meeting:MD  The following were discussed:Patient's diagnosis: Hematemesis and history of liver cirrhosis with ascites, chronic hepatitis C, liver cancer, chronic anxiety disorder, chronic pain syndrome, COPD, continues to smoke will be admitted to the hospital and he will be made n.p.o. after midnight.  Hemoglobin hematocrit will be monitored closely.  Patient verbalized understanding of the plan.  , Patient's progosis: Unable to determine and Goals for treatment: Full Code  Bradley Dunlap  is the healthcare POA  Additional follow-up to be provided: Hospitalist and gastroenterology  Time spent during discussion:17 min  Bradley Mango, MD

## 2018-07-08 NOTE — ED Provider Notes (Signed)
[facility] Emergency Department Provider Note   ____________________________________________   First MD Initiated Contact with Patient 07/08/18 1717     (approximate)  I have reviewed the triage vital signs and the nursing notes.   HISTORY  Chief Complaint Shortness of Breath    HPI Bradley Dunlap is a 63 y.o. male who reports he coughed up bright red blood.  That made him nauseated.  He is fairly certain he coughed up blood did not vomit.  He says he had surgery for this in the past.  Review of his old records here only shows esophageal varices.         Past Medical History:  Diagnosis Date  . Alcohol abuse   . Alcohol use   . Anemia   . Anxiety   . Arthritis   . Ascites   . Cancer [facility])    liver cancer  . Chronic hepatitis C ([place])   . COPD (chronic obstructive pulmonary disease) ([place])   . Esophageal varices ([place])   . GERD (gastroesophageal reflux disease)   . Hepatitis C   . Hypertension   . Illiterate    cannot read or write  . Liver cirrhosis ([place])   . Peripheral vascular disease ([place])   . Tobacco use     Patient Active Problem List   Diagnosis Date Noted  . Hepatocellular carcinoma ([place]) 03/12/2017  . Alcoholic cirrhosis of liver with ascites ([place]) 01/27/2017  . Peritonitis ([place]) 01/13/2017  . Hematemesis 01/03/2017  . Ascites 12/24/2016  . Chronic viral hepatitis C ([place]) 07/02/2015  . Chronic pain syndrome 02/24/2013  . Neck pain 07/16/2005  . Chronic obstructive pulmonary disease ([place]) 08/01/2003  . Anxiety state 07/27/2003    Past Surgical History:  Procedure Laterality Date  . BACK SURGERY    . COLONOSCOPY WITH PROPOFOL N/A 01/21/2018   Procedure: COLONOSCOPY WITH PROPOFOL;  Surgeon: [name], MD;  Location[facility] ENDOSCOPY;  Service: Endoscopy;  Laterality: N/A;  . ESOPHAGOGASTRODUODENOSCOPY N/A 01/04/2017   Procedure: ESOPHAGOGASTRODUODENOSCOPY (EGD);  Surgeon: Lin Landsman, MD;  Location: [facility]  ENDOSCOPY;  Service: Gastroenterology;  Laterality: N/A;  . ESOPHAGOGASTRODUODENOSCOPY (EGD) WITH PROPOFOL N/A 01/22/2017   Procedure: ESOPHAGOGASTRODUODENOSCOPY (EGD) WITH PROPOFOL;  Surgeon: Toledo, Benay Pike, MD;  Location: [facility] ENDOSCOPY;  Service: Gastroenterology;  Laterality: N/A;  . PARACENTESIS    . SPINE SURGERY     lower lumbar fused    Prior to Admission medications   Medication Sig Start Date End Date Taking? Authorizing Provider  albuterol (PROVENTIL HFA;VENTOLIN HFA) 108 (90 Base) MCG/ACT inhaler Inhale 2 puffs into the lungs every 4 (four) hours as needed for wheezing. 01/14/17  Yes Leslye Peer, Richard, MD  albuterol (PROVENTIL) (2.5 MG/3ML) 0.083% nebulizer solution Take 3 mLs (2.5 mg total) by nebulization every 4 (four) hours as needed for wheezing. 12/27/16  Yes Wieting, Richard, MD  clonazePAM (KLONOPIN) 0.5 MG tablet Take 1 tablet (0.5 mg total) by mouth 2 (two) times daily as needed for anxiety. 01/14/17  Yes Wieting, Richard, MD  esomeprazole (NEXIUM) 40 MG capsule Take 40 mg by mouth daily. 11/26/17  Yes [provider]  Fluticasone-Salmeterol (ADVAIR DISKUS) 250-50 MCG/DOSE AEPB Inhale 1 puff into the lungs 2 (two) times daily. 01/14/17  Yes Wieting, Richard, MD  furosemide (LASIX) 40 MG tablet Take 1 tablet (40 mg total) by mouth daily. Patient taking differently: Take 20 mg by mouth daily.  01/05/17 07/08/18 Yes Demetrios Loll, MD  propranolol (INDERAL) 10 MG tablet Take 10  mg by mouth 2 (two) times daily. 11/04/17  Yes [provider]  traMADol (ULTRAM) 50 MG tablet Take 1 tablet (50 mg total) by mouth every 6 (six) hours as needed for moderate pain. 01/14/17  Yes Wieting, Richard, MD  tiotropium (SPIRIVA HANDIHALER) 18 MCG inhalation capsule Place 1 capsule (18 mcg total) into inhaler and inhale daily. 01/14/17 01/21/18  Loletha Grayer, MD    Allergies Codeine and Indomethacin  Family History  Problem Relation Age of Onset  . Cancer Mother   . Heart attack  Father     Social History Social History   Tobacco Use  . Smoking status: Current Every Day Smoker    Packs/day: 0.50    Types: Cigarettes  . Smokeless tobacco: Former Network engineer Use Topics  . Alcohol use: No    Comment: 2-3 months  . Drug use: No    Review of Systems  Constitutional: No fever/chills Eyes: No visual changes. ENT: No sore throat. Cardiovascular: Denies chest pain. Respiratory: Some shortness of breath. Gastrointestinal: No abdominal pain.  No nausea, no vomiting.  No diarrhea.  No constipation. Genitourinary: Negative for dysuria. Musculoskeletal: Negative for back pain. Skin: Negative for rash. Neurological: Negative for headaches, focal weakness  ____________________________________________   PHYSICAL EXAM:  VITAL SIGNS: ED Triage Vitals  Enc Vitals Group     BP 07/08/18 1721 107/73     Pulse Rate 07/08/18 1717 77     Resp 07/08/18 1717 16     Temp 07/08/18 1721 98.5 F (36.9 C)     Temp Source 07/08/18 1721 Oral     SpO2 07/08/18 1717 96 %     Weight 07/08/18 1718 228 lb (103.4 kg)     Height 07/08/18 1718 6\' 2"  (1.88 m)     Head Circumference --      Peak Flow --      Pain Score 07/08/18 1717 0     Pain Loc --      Pain Edu? --      Excl. in [place]? --    Constitutional: Alert and oriented.  Chronically ill-appearing gentleman but not in acute distress Eyes: Conjunctivae are normal. Head: Atraumatic. Nose: No congestion/rhinnorhea. Mouth/Throat: Mucous membranes are moist.  Oropharynx non-erythematous. Neck: No stridor.   Cardiovascular: Normal rate, regular rhythm. Grossly normal heart sounds.  Good peripheral circulation. Respiratory: Normal respiratory effort.  No retractions. Lungs CTAB. Gastrointestinal: Soft and nontender.distention. No abdominal bruits. No CVA tenderness. Musculoskeletal: No lower extremity tenderness nor edema.  Neurologic:  Normal speech and language. No gross focal neurologic deficits are appreciated.   Skin:  Skin is warm, dry and intact. No rash noted.   ____________________________________________   LABS (all labs ordered are listed, but only abnormal results are displayed)  Labs Reviewed  COMPREHENSIVE METABOLIC PANEL - Abnormal; Notable for the following components:      Result Value   Sodium 132 (*)    Glucose, Bld 129 (*)    Calcium 8.6 (*)    Albumin 3.0 (*)    AST 151 (*)    ALT 108 (*)    Total Bilirubin 2.8 (*)    All other components within normal limits  CBC WITH DIFFERENTIAL/PLATELET - Abnormal; Notable for the following components:   RBC 4.02 (*)    HCT 38.3 (*)    RDW 15.7 (*)    Platelets 93 (*)    Monocytes Absolute 1.2 (*)    All other components within normal limits  TYPE AND SCREEN  PREPARE RBC (CROSSMATCH)   ____________________________________________  EKG  EKG read and interpreted by me shows normal sinus rhythm rate of 73 0 axis no apparent acute ST-T changes very low amplitude throughout. ____________________________________________  RADIOLOGY  ED MD interpretation: Chest x-ray and chest CT read by radiology reviewed by me do not show a reason for the possible hemoptysis.  More likely patient had hematemesis.  He is not sure which.  Official radiology report(s): Ct Chest W Contrast  Result Date: 07/08/2018 CLINICAL DATA:  Hemoptysis.  History of COPD and cirrhosis. EXAM: CT CHEST WITH CONTRAST TECHNIQUE: Multidetector CT imaging of the chest was performed during intravenous contrast administration. CONTRAST:  51mL OMNIPAQUE IOHEXOL 300 MG/ML  SOLN COMPARISON:  Chest radiograph July 08, 2018 and CT chest December 07, 2011 and CT abdomen and pelvis in May 10, 2018 FINDINGS: CARDIOVASCULAR: Heart size is normal. No pericardial effusion. Moderate coronary artery calcifications. Thoracic aorta is normal course and caliber, mild intimal thickening and calcific atherosclerosis. No central pulmonary embolism though not tailored for evaluation.  MEDIASTINUM/NODES: No mediastinal mass. No lymphadenopathy by CT size criteria. LUNGS/PLEURA: Tracheobronchial tree is patent, no pneumothorax. Bronchial wall thickening. RIGHT middle lobe bulla. Mild centrilobular emphysema. Stable stellate scarring LEFT lower lobe. UPPER ABDOMEN: Nodular cirrhotic liver with partially imaged splenomegaly and moderate ascites with mesenteric densities most compatible with varicosities. Subcentimeter layering cholelithiasis with wall thickening seen with portal hypertension. MUSCULOSKELETAL: Nonacute. Osteopenia with multiple old mild thoracic compression fractures. IMPRESSION: 1. Bronchial wall thickening seen with bronchitis or reactive airway disease. No pneumonia. 2. Similar cirrhosis and evidence of portal hypertension including moderate ascites. Aortic Atherosclerosis (ICD10-I70.0) and Emphysema (ICD10-J43.9). Electronically Signed   By: Elon Alas M.D.   On: 07/08/2018 18:16   Dg Chest Portable 1 View  Result Date: 07/08/2018 CLINICAL DATA:  Hemoptysis EXAM: PORTABLE CHEST 1 VIEW COMPARISON:  June 27, 2018 FINDINGS: Lungs are mildly hyperexpanded without edema or consolidation. There are scattered areas of mild scarring. Heart size and pulmonary vascularity are normal. No adenopathy. No bone lesions. IMPRESSION: Lungs hyperexpanded with scattered areas of mild scarring. No edema or consolidation. Stable cardiac silhouette. No appreciable change from most recent study. Electronically Signed   By: Lowella Grip III M.D.   On: 07/08/2018 17:45    ____________________________________________   PROCEDURES  Procedure(s) performed (including Critical Care):  Procedures   ____________________________________________   INITIAL IMPRESSION / ASSESSMENT AND PLAN / ED COURSE  I discussed this patient with Dr. Marius Ditch.  She agrees we should admit this gentleman he is typed and crossed so if he begins bleeding from any varices or any other problem we can begin  transfusion.  We will get him on octreotide and pantoprazole per Dr. Marius Ditch she will plan on scoping him in the morning.              ____________________________________________   FINAL CLINICAL IMPRESSION(S) / ED DIAGNOSES  Final diagnoses:  Hematemesis, presence of nausea not specified  Hemoptysis     ED Discharge Orders    None       Note:  This document was prepared using Dragon voice recognition software and may include unintentional dictation errors.    Nena Polio, MD 07/08/18 2000

## 2018-07-08 NOTE — ED Triage Notes (Signed)
Pt to ed via EMS from home with complaints of coughing up blood. Pt states this has happened in the past and he has had 2 surgeries for this. Pt coughing up dark mucus. Pt has hx of copd and liver cancer.

## 2018-07-08 NOTE — ED Notes (Addendum)
ED TO INPATIENT HANDOFF REPORT  ED Nurse Name and Phone #: Karena Addison [phone]  S Name/Age/Gender Bradley Dunlap 63 y.o. male Room/Bed: ED03A/ED03A  Code Status   Code Status: Prior  Home/SNF/Other Home Patient oriented to: self, place, time and situation Is this baseline? Yes   Triage Complete: Triage complete  Chief Complaint Coughing up blood  Triage Note Pt to ed via EMS from home with complaints of coughing up blood. Pt states this has happened in the past and he has had 2 surgeries for this. Pt coughing up dark mucus. Pt has hx of copd and liver cancer.    Allergies Allergies  Allergen Reactions  . Codeine Itching and Other (See Comments)    Other reaction(s): Other (See Comments) Back bone itching    . Indomethacin Itching    Other reaction(s): ITCHING    Level of Care/Admitting Diagnosis ED Disposition    ED Disposition Condition [facility] Area: [place] [[id number]]  Level of Care: Med-Surg [16]  Diagnosis: Hematemesis [578.0.ICD-9-CM]  Admitting Physician: Nicholes Mango [[id number]]  Attending Physician: Nicholes Mango [[id number]]  Estimated length of stay: past midnight tomorrow  Certification:: I certify this patient will need inpatient services for at least 2 midnights  Bed request comments: 2c  PT Class (Do Not Modify): Inpatient [101]  PT Acc Code (Do Not Modify): Private [1]       B Medical/Surgery History Past Medical History:  Diagnosis Date  . Alcohol abuse   . Alcohol use   . Anemia   . Anxiety   . Arthritis   . Ascites   . Cancer [facility])    liver cancer  . Chronic hepatitis C ([place])   . COPD (chronic obstructive pulmonary disease) ([place])   . Esophageal varices ([place])   . GERD (gastroesophageal reflux disease)   . Hepatitis C   . Hypertension   . Illiterate    cannot read or write  . Liver cirrhosis ([place])   . Peripheral vascular disease ([place])   . Tobacco use    Past Surgical History:  Procedure Laterality Date  .  BACK SURGERY    . COLONOSCOPY WITH PROPOFOL N/A 01/21/2018   Procedure: COLONOSCOPY WITH PROPOFOL;  Surgeon: [name], MD;  Location: [facility] ENDOSCOPY;  Service: Endoscopy;  Laterality: N/A;  . ESOPHAGOGASTRODUODENOSCOPY N/A 01/04/2017   Procedure: ESOPHAGOGASTRODUODENOSCOPY (EGD);  Surgeon: Lin Landsman, MD;  Location: [facility] ENDOSCOPY;  Service: Gastroenterology;  Laterality: N/A;  . ESOPHAGOGASTRODUODENOSCOPY (EGD) WITH PROPOFOL N/A 01/22/2017   Procedure: ESOPHAGOGASTRODUODENOSCOPY (EGD) WITH PROPOFOL;  Surgeon: Toledo, Benay Pike, MD;  Location: [facility] ENDOSCOPY;  Service: Gastroenterology;  Laterality: N/A;  . PARACENTESIS    . SPINE SURGERY     lower lumbar fused     A IV Location/Drains/Wounds Patient Lines/Drains/Airways Status   Active Line/Drains/Airways    Name:   Placement date:   Placement time:   Site:   Days:   Peripheral IV 07/08/18 Left Forearm   07/08/18    1722    Forearm   less than 1          Intake/Output Last 24 hours No intake or output data in the 24 hours ending 07/08/18 2026  Labs/Imaging Results for orders placed or performed during the hospital encounter of 07/08/18 (from the past 48 hour(s))  Comprehensive metabolic panel     Status: Abnormal   Collection Time: 07/08/18  5:23 PM  Result Value Ref Range   Sodium 132 (L) 135 -  145 mmol/L   Potassium 4.0 3.5 - 5.1 mmol/L   Chloride 101 98 - 111 mmol/L   CO2 24 22 - 32 mmol/L   Glucose, Bld 129 (H) 70 - 99 mg/dL   BUN 12 8 - 23 mg/dL   Creatinine, Ser 0.93 0.61 - 1.24 mg/dL   Calcium 8.6 (L) 8.9 - 10.3 mg/dL   Total Protein 7.2 6.5 - 8.1 g/dL   Albumin 3.0 (L) 3.5 - 5.0 g/dL   AST 151 (H) 15 - 41 U/L   ALT 108 (H) 0 - 44 U/L   Alkaline Phosphatase 85 38 - 126 U/L   Total Bilirubin 2.8 (H) 0.3 - 1.2 mg/dL   GFR calc non Af Amer >60 >60 mL/min   GFR calc Af Amer >60 >60 mL/min   Anion gap 7 5 - 15    Comment: Performed at [facility], [address] [place], [place] [address]   CBC with Differential     Status: Abnormal   Collection Time: 07/08/18  5:23 PM  Result Value Ref Range   WBC 8.1 4.0 - 10.5 K/uL   RBC 4.02 (L) 4.22 - 5.81 MIL/uL   Hemoglobin 13.3 13.0 - 17.0 g/dL   HCT 38.3 (L) 39.0 - 52.0 %   MCV 95.3 80.0 - 100.0 fL   MCH 33.1 26.0 - 34.0 pg   MCHC 34.7 30.0 - 36.0 g/dL   RDW 15.7 (H) 11.5 - 15.5 %   Platelets 93 (L) 150 - 400 K/uL    Comment: Immature Platelet Fraction may be clinically indicated, consider ordering this additional test [id number]    nRBC 0.2 0.0 - 0.2 %   Neutrophils Relative % 63 %   Neutro Abs 5.0 1.7 - 7.7 K/uL   Lymphocytes Relative 17 %   Lymphs Abs 1.4 0.7 - 4.0 K/uL   Monocytes Relative 14 %   Monocytes Absolute 1.2 (H) 0.1 - 1.0 K/uL   Eosinophils Relative 5 %   Eosinophils Absolute 0.4 0.0 - 0.5 K/uL   Basophils Relative 1 %   Basophils Absolute 0.1 0.0 - 0.1 K/uL   Immature Granulocytes 0 %   Abs Immature Granulocytes 0.03 0.00 - 0.07 K/uL    Comment: Performed at [facility], [address] [place], [place] [address]  Type and screen [place]     Status: None   Collection Time: 07/08/18  5:25 PM  Result Value Ref Range   ABO/RH(D) O NEG    Antibody Screen NEG    Sample Expiration      07/11/2018 Performed at [facility], [address] [place], [place] [address]   Prepare RBC     Status: None   Collection Time: 07/08/18  5:36 PM  Result Value Ref Range   Order Confirmation      ORDER PROCESSED BY BLOOD BANK Performed at [facility], [address] [place],  [address]    Ct Chest W Contrast  Result Date: 07/08/2018 CLINICAL DATA:  Hemoptysis.  History of COPD and cirrhosis. EXAM: CT CHEST WITH CONTRAST TECHNIQUE: Multidetector CT imaging of the chest was performed during intravenous contrast administration. CONTRAST:  60mL OMNIPAQUE IOHEXOL 300 MG/ML  SOLN COMPARISON:  Chest radiograph July 08, 2018 and CT chest December 07, 2011 and CT  abdomen and pelvis in May 10, 2018 FINDINGS: CARDIOVASCULAR: Heart size is normal. No pericardial effusion. Moderate coronary artery calcifications. Thoracic aorta is normal course and caliber, mild intimal thickening and  calcific atherosclerosis. No central pulmonary embolism though not tailored for evaluation. MEDIASTINUM/NODES: No mediastinal mass. No lymphadenopathy by CT size criteria. LUNGS/PLEURA: Tracheobronchial tree is patent, no pneumothorax. Bronchial wall thickening. RIGHT middle lobe bulla. Mild centrilobular emphysema. Stable stellate scarring LEFT lower lobe. UPPER ABDOMEN: Nodular cirrhotic liver with partially imaged splenomegaly and moderate ascites with mesenteric densities most compatible with varicosities. Subcentimeter layering cholelithiasis with wall thickening seen with portal hypertension. MUSCULOSKELETAL: Nonacute. Osteopenia with multiple old mild thoracic compression fractures. IMPRESSION: 1. Bronchial wall thickening seen with bronchitis or reactive airway disease. No pneumonia. 2. Similar cirrhosis and evidence of portal hypertension including moderate ascites. Aortic Atherosclerosis (ICD10-I70.0) and Emphysema (ICD10-J43.9). Electronically Signed   By: Elon Alas M.D.   On: 07/08/2018 18:16   Dg Chest Portable 1 View  Result Date: 07/08/2018 CLINICAL DATA:  Hemoptysis EXAM: PORTABLE CHEST 1 VIEW COMPARISON:  June 27, 2018 FINDINGS: Lungs are mildly hyperexpanded without edema or consolidation. There are scattered areas of mild scarring. Heart size and pulmonary vascularity are normal. No adenopathy. No bone lesions. IMPRESSION: Lungs hyperexpanded with scattered areas of mild scarring. No edema or consolidation. Stable cardiac silhouette. No appreciable change from most recent study. Electronically Signed   By: Lowella Grip III M.D.   On: 07/08/2018 17:45    Pending Labs Unresulted Labs (From admission, onward)    Start     Ordered   07/08/18 2003   Hemoglobin and hematocrit, blood  Now then every 6 hours,   STAT     07/08/18 2003   Signed and Held  HIV antibody (Routine Testing)  Once,   R     Signed and Held   Signed and Held  CBC  Tomorrow morning,   R     Signed and Held   Signed and Held  Comprehensive metabolic panel  Tomorrow morning,   R     Signed and Held          Vitals/Pain Today's Vitals   07/08/18 1718 07/08/18 1721 07/08/18 1730 07/08/18 1900  BP:  107/73 102/71 (!) 113/59  Pulse:   67 65  Resp:   (!) 24 15  Temp:  98.5 F (36.9 C)    TempSrc:  Oral    SpO2:   95% 95%  Weight: 103.4 kg     Height: 6\' 2"  (1.88 m)     PainSc:        Isolation Precautions No active isolations  Medications Medications  0.9 %  sodium chloride infusion (has no administration in time range)  octreotide (SANDOSTATIN) 2 mcg/mL load via infusion 50 mcg (has no administration in time range)    And  octreotide (SANDOSTATIN) 500 mcg in sodium chloride 0.9 % 250 mL (2 mcg/mL) infusion (has no administration in time range)  pantoprazole (PROTONIX) 80 mg in sodium chloride 0.9 % 100 mL IVPB (has no administration in time range)  pantoprazole (PROTONIX) 80 mg in sodium chloride 0.9 % 250 mL (0.32 mg/mL) infusion (has no administration in time range)  pantoprazole (PROTONIX) injection 40 mg (has no administration in time range)  iohexol (OMNIPAQUE) 300 MG/ML solution 75 mL (75 mLs Intravenous Contrast Given 07/08/18 1757)    Mobility walks with person assist Low fall risk   Focused Assessments    R Recommendations: See Admitting Provider Note  Report given to: Erline Levine, RN

## 2018-07-09 ENCOUNTER — Inpatient Hospital Stay: Payer: Medicaid Other | Admitting: Anesthesiology

## 2018-07-09 ENCOUNTER — Inpatient Hospital Stay: Payer: Medicaid Other

## 2018-07-09 ENCOUNTER — Encounter
Admission: EM | Disposition: A | Discharge status: Other Acute Inpatient Hospital | Payer: Self-pay | Source: Home / Self Care | Attending: Internal Medicine

## 2018-07-09 DIAGNOSIS — I8511 Secondary esophageal varices with bleeding: Secondary | ICD-10-CM

## 2018-07-09 DIAGNOSIS — K7031 Alcoholic cirrhosis of liver with ascites: Principal | ICD-10-CM

## 2018-07-09 DIAGNOSIS — K920 Hematemesis: Secondary | ICD-10-CM

## 2018-07-09 DIAGNOSIS — B182 Chronic viral hepatitis C: Secondary | ICD-10-CM

## 2018-07-09 HISTORY — PX: ESOPHAGOGASTRODUODENOSCOPY: SHX5428

## 2018-07-09 LAB — COMPREHENSIVE METABOLIC PANEL
ALBUMIN: 2.6 g/dL — AB (ref 3.5–5.0)
ALT: 91 U/L — ABNORMAL HIGH (ref 0–44)
ANION GAP: 7 (ref 5–15)
AST: 129 U/L — ABNORMAL HIGH (ref 15–41)
Alkaline Phosphatase: 72 U/L (ref 38–126)
BUN: 17 mg/dL (ref 8–23)
CO2: 25 mmol/L (ref 22–32)
Calcium: 8.4 mg/dL — ABNORMAL LOW (ref 8.9–10.3)
Chloride: 101 mmol/L (ref 98–111)
Creatinine, Ser: 0.78 mg/dL (ref 0.61–1.24)
GFR calc Af Amer: >60 mL/min (ref >60)
GFR calc non Af Amer: >60 mL/min (ref >60)
GLUCOSE: 110 mg/dL — AB (ref 70–99)
Potassium: 4.2 mmol/L (ref 3.5–5.1)
SODIUM: 133 mmol/L — AB (ref 135–145)
Total Bilirubin: 2.8 mg/dL — ABNORMAL HIGH (ref 0.3–1.2)
Total Protein: 6.7 g/dL (ref 6.5–8.1)

## 2018-07-09 LAB — CBC
HCT: 33.5 % — ABNORMAL LOW (ref 39.0–52.0)
Hemoglobin: 11.7 g/dL — ABNORMAL LOW (ref 13.0–17.0)
MCH: 33.7 pg (ref 26.0–34.0)
MCHC: 34.9 g/dL (ref 30.0–36.0)
MCV: 96.5 fL (ref 80.0–100.0)
Platelets: 76 10*3/uL — ABNORMAL LOW (ref 150–400)
RBC: 3.47 MIL/uL — ABNORMAL LOW (ref 4.22–5.81)
RDW: 15.9 % — ABNORMAL HIGH (ref 11.5–15.5)
WBC: 6.2 10*3/uL (ref 4.0–10.5)
nRBC: 0 % (ref 0.0–0.2)

## 2018-07-09 LAB — HEMOGLOBIN AND HEMATOCRIT, BLOOD
HCT: 34.2 % — ABNORMAL LOW (ref 39.0–52.0)
Hemoglobin: 11.8 g/dL — ABNORMAL LOW (ref 13.0–17.0)

## 2018-07-09 SURGERY — EGD (ESOPHAGOGASTRODUODENOSCOPY)
Anesthesia: General

## 2018-07-09 MED ORDER — GLYCOPYRROLATE 0.2 MG/ML IJ SOLN
INTRAMUSCULAR | Status: AC
  Filled 2018-07-09: qty 1

## 2018-07-09 MED ORDER — NICOTINE 14 MG/24HR TD PT24
14.0000 mg | MEDICATED_PATCH | Freq: Every day | TRANSDERMAL | Status: DC
  Administered 2018-07-09 – 2018-07-18 (×10): 14 mg via TRANSDERMAL
  Filled 2018-07-09 (×11): qty 1

## 2018-07-09 MED ORDER — FENTANYL CITRATE (PF) 100 MCG/2ML IJ SOLN: 25.0000 ug | INTRAMUSCULAR | Status: DC | PRN

## 2018-07-09 MED ORDER — ALBUTEROL SULFATE (2.5 MG/3ML) 0.083% IN NEBU
2.5000 mg | INHALATION_SOLUTION | Freq: Four times a day (QID) | RESPIRATORY_TRACT | Status: DC
  Administered 2018-07-09 – 2018-07-13 (×18): 2.5 mg via RESPIRATORY_TRACT
  Filled 2018-07-09 (×18): qty 3

## 2018-07-09 MED ORDER — SODIUM CHLORIDE 0.9 % IV SOLN
INTRAVENOUS | Status: DC
  Administered 2018-07-09: 1000 mL via INTRAVENOUS

## 2018-07-09 MED ORDER — EPHEDRINE SULFATE 50 MG/ML IJ SOLN
INTRAMUSCULAR | Status: AC
  Filled 2018-07-09: qty 1

## 2018-07-09 MED ORDER — LIDOCAINE HCL (PF) 2 % IJ SOLN
INTRAMUSCULAR | Status: AC
  Filled 2018-07-09: qty 10

## 2018-07-09 MED ORDER — SUCCINYLCHOLINE CHLORIDE 20 MG/ML IJ SOLN
INTRAMUSCULAR | Status: DC | PRN
  Administered 2018-07-09: 120 mg via INTRAVENOUS

## 2018-07-09 MED ORDER — PROPOFOL 10 MG/ML IV BOLUS
INTRAVENOUS | Status: AC
  Filled 2018-07-09: qty 40

## 2018-07-09 MED ORDER — LIDOCAINE HCL (CARDIAC) PF 100 MG/5ML IV SOSY
PREFILLED_SYRINGE | INTRAVENOUS | Status: DC | PRN
  Administered 2018-07-09: 100 mg via INTRAVENOUS

## 2018-07-09 MED ORDER — PROPOFOL 10 MG/ML IV BOLUS
INTRAVENOUS | Status: AC
  Filled 2018-07-09: qty 20

## 2018-07-09 MED ORDER — SODIUM CHLORIDE 0.9 % IV SOLN
2.0000 g | INTRAVENOUS | Status: AC
  Administered 2018-07-09 – 2018-07-15 (×7): 2 g via INTRAVENOUS
  Filled 2018-07-09 (×5): qty 2
  Filled 2018-07-09: qty 20
  Filled 2018-07-09: qty 2

## 2018-07-09 MED ORDER — PROPOFOL 500 MG/50ML IV EMUL
INTRAVENOUS | Status: AC
  Filled 2018-07-09: qty 50

## 2018-07-09 MED ORDER — PROPOFOL 10 MG/ML IV BOLUS
INTRAVENOUS | Status: DC | PRN
  Administered 2018-07-09: 100 mg via INTRAVENOUS
  Administered 2018-07-09: 20 mg via INTRAVENOUS

## 2018-07-09 MED ORDER — HYDROMORPHONE HCL 1 MG/ML IJ SOLN
0.5000 mg | INTRAMUSCULAR | Status: DC | PRN
  Administered 2018-07-09 – 2018-07-13 (×19): 0.5 mg via INTRAVENOUS
  Filled 2018-07-09 (×17): qty 0.5

## 2018-07-09 MED ORDER — EPHEDRINE SULFATE 50 MG/ML IJ SOLN
INTRAMUSCULAR | Status: DC | PRN
  Administered 2018-07-09: 5 mg via INTRAVENOUS

## 2018-07-09 MED ORDER — PHENYLEPHRINE HCL 10 MG/ML IJ SOLN
INTRAMUSCULAR | Status: DC | PRN
  Administered 2018-07-09 (×3): 100 ug via INTRAVENOUS

## 2018-07-09 MED ORDER — DEXMEDETOMIDINE HCL IN NACL 200 MCG/50ML IV SOLN
INTRAVENOUS | Status: AC
  Filled 2018-07-09: qty 50

## 2018-07-09 MED ORDER — PROPOFOL 500 MG/50ML IV EMUL
INTRAVENOUS | Status: DC | PRN
  Administered 2018-07-09: 75 ug/kg/min via INTRAVENOUS

## 2018-07-09 NOTE — Consult Note (Signed)
Cephas Darby, MD [address]  [place]  [place], [place] [address]  Main: [phone]  Fax: [phone] Pager: [phone]   Consultation  Referring Provider:     No ref. provider found Primary Care Physician:  Ellene Route Primary Gastroenterologist: [facility]       Reason for Consultation:     Hematemesis  Date of Admission:  07/08/2018 Date of Consultation:  07/09/2018         HPI:   Bradley Dunlap is a 63 y.o. male with history of COPD, decompensated alcoholic cirrhosis of liver, chronic hepatitis C, treatment nave, known bleeding esophageal varices in 12/2016, underwent ligation, ascites, history of HCC status post ablation with no recurrence based on the recent imaging study.  Patient came to ER yesterday evening after throwing up blood, patient describes it as dark red followed by bright red blood 2-3 episodes.  No further bleeding episodes since admission. He has been hemodynamically stable, hemoglobin dropped from 13.8 on admission to 11.7 this morning, platelets 76.  His hemoglobin runs between 14-15 at baseline.  He reports mild abdominal discomfort in flanks particularly when coughing.  He also reports severe constipation.  Dr. Beverlee Nims, ER physician called me last night and I recommended pantoprazole drip, octreotide drip, maintain n.p.o. status.  I started him on ceftriaxone this morning for SBP prophylaxis, received first dose at 7 AM.  Patient is on diuretics, propranolol, Nexium as outpatient. Reports that he stopped drinking alcohol about 4 to 5 years ago.  The plan is to treat his hepatitis C based on his last visit with Worthy Flank, NP, [facility] on 06/27/2018.  His last EGD was in 01/22/2017 by Dr. Alice Reichert found to have Candida esophagitis, small esophageal varices.  Prior to this, he had bleeding esophageal varices on 01/04/2017, EGD was actually performed by me during that hospitalization, underwent variceal ligation  NSAIDs:  None  Antiplts/Anticoagulants/Anti thrombotics: None  GI Procedures:   EGD 01/04/2017 by Dr. Marius Ditch  - Normal duodenal bulb and second portion of the duodenum. - Portal hypertensive gastropathy. - Grade III and large (> 5 mm) esophageal varices. Incompletely eradicated. Banded. - No specimens collected.  EGD 01/22/2017 by Dr. Alice Reichert - Monilial esophagitis. Brushings performed. - Non-bleeding grade I esophageal varices. - Portal hypertensive gastropathy. - Normal examined duodenum.  Past Medical History:  Diagnosis Date   Alcohol abuse    Alcohol use    Anemia    Anxiety    Arthritis    Ascites    Cancer (HCC)    liver cancer   Chronic hepatitis C (HCC)    COPD (chronic obstructive pulmonary disease) (HCC)    Esophageal varices (HCC)    GERD (gastroesophageal reflux disease)    Hepatitis C    Hypertension    Illiterate    cannot read or write   Liver cirrhosis ([place])    Peripheral vascular disease ([place])    Tobacco use     Past Surgical History:  Procedure Laterality Date   BACK SURGERY     COLONOSCOPY WITH PROPOFOL N/A 01/21/2018   Procedure: COLONOSCOPY WITH PROPOFOL;  Surgeon: [name], MD;  Location: [facility] ENDOSCOPY;  Service: Endoscopy;  Laterality: N/A;   ESOPHAGOGASTRODUODENOSCOPY N/A 01/04/2017   Procedure: ESOPHAGOGASTRODUODENOSCOPY (EGD);  Surgeon: Lin Landsman, MD;  Location: [facility] ENDOSCOPY;  Service: Gastroenterology;  Laterality: N/A;   ESOPHAGOGASTRODUODENOSCOPY (EGD) WITH PROPOFOL N/A 01/22/2017   Procedure: ESOPHAGOGASTRODUODENOSCOPY (EGD) WITH PROPOFOL;  Surgeon: Eden,  Benay Pike, MD;  Location: [facility] ENDOSCOPY;  Service: Gastroenterology;  Laterality: N/A;   PARACENTESIS     SPINE SURGERY     lower lumbar fused    Prior to Admission medications   Medication Sig Start Date End Date Taking? Authorizing Provider  albuterol (PROVENTIL HFA;VENTOLIN HFA) 108 (90 Base) MCG/ACT inhaler Inhale 2 puffs into the lungs  every 4 (four) hours as needed for wheezing. 01/14/17  Yes Leslye Peer, Richard, MD  albuterol (PROVENTIL) (2.5 MG/3ML) 0.083% nebulizer solution Take 3 mLs (2.5 mg total) by nebulization every 4 (four) hours as needed for wheezing. 12/27/16  Yes Wieting, Richard, MD  clonazePAM (KLONOPIN) 0.5 MG tablet Take 1 tablet (0.5 mg total) by mouth 2 (two) times daily as needed for anxiety. 01/14/17  Yes Wieting, Richard, MD  esomeprazole (NEXIUM) 40 MG capsule Take 40 mg by mouth daily. 11/26/17  Yes [provider]  Fluticasone-Salmeterol (ADVAIR DISKUS) 250-50 MCG/DOSE AEPB Inhale 1 puff into the lungs 2 (two) times daily. 01/14/17  Yes Wieting, Richard, MD  furosemide (LASIX) 40 MG tablet Take 1 tablet (40 mg total) by mouth daily. Patient taking differently: Take 20 mg by mouth daily.  01/05/17 07/08/18 Yes Demetrios Loll, MD  propranolol (INDERAL) 10 MG tablet Take 10 mg by mouth 2 (two) times daily. 11/04/17  Yes [provider]  traMADol (ULTRAM) 50 MG tablet Take 1 tablet (50 mg total) by mouth every 6 (six) hours as needed for moderate pain. 01/14/17  Yes Wieting, Richard, MD  tiotropium (SPIRIVA HANDIHALER) 18 MCG inhalation capsule Place 1 capsule (18 mcg total) into inhaler and inhale daily. 01/14/17 01/21/18  Loletha Grayer, MD   Current Facility-Administered Medications:    [MAR Hold] 0.9 %  sodium chloride infusion, 10 mL/hr, Intravenous, Once, Gouru, Aruna, MD   0.9 %  sodium chloride infusion, , Intravenous, Continuous, Wieting, Richard, MD, Last Rate: 25 mL/hr at 07/09/18 0951   0.9 %  sodium chloride infusion, , Intravenous, Continuous, Aurelie Dicenzo, Tally Due, MD, Last Rate: 20 mL/hr at 07/09/18 1009, 1,000 mL at 07/09/18 1009   [MAR Hold] albuterol (PROVENTIL) (2.5 MG/3ML) 0.083% nebulizer solution 2.5 mg, 2.5 mg, Nebulization, Q6H, Wieting, Richard, MD   Doug Sou Hold] cefTRIAXone (ROCEPHIN) 2 g in sodium chloride 0.9 % 100 mL IVPB, 2 g, Intravenous, Q24H, Jaydence Vanyo, Tally Due, MD, Last  Rate: 200 mL/hr at 07/09/18 0943, 2 g at 07/09/18 0943   [MAR Hold] clonazePAM (KLONOPIN) tablet 0.5 mg, 0.5 mg, Oral, BID PRN, Gouru, Aruna, MD   [MAR Hold] mometasone-formoterol (DULERA) 200-5 MCG/ACT inhaler 2 puff, 2 puff, Inhalation, BID, Gouru, Aruna, MD, 2 puff at 07/09/18 0939   [MAR Hold] nicotine (NICODERM CQ - dosed in mg/24 hours) patch 14 mg, 14 mg, Transdermal, Daily, Wieting, Richard, MD   [COMPLETED] octreotide (SANDOSTATIN) 2 mcg/mL load via infusion 50 mcg, 50 mcg, Intravenous, Once, 50 mcg at 07/08/18 2128 **AND** octreotide (SANDOSTATIN) 500 mcg in sodium chloride 0.9 % 250 mL (2 mcg/mL) infusion, 50 mcg/hr, Intravenous, Continuous, Gouru, Aruna, MD, Last Rate: 25 mL/hr at 07/09/18 0800, 50 mcg/hr at 07/09/18 0800   [MAR Hold] ondansetron (ZOFRAN) tablet 4 mg, 4 mg, Oral, Q6H PRN **OR** [MAR Hold] ondansetron (ZOFRAN) injection 4 mg, 4 mg, Intravenous, Q6H PRN, Gouru, Aruna, MD   pantoprazole (PROTONIX) 80 mg in sodium chloride 0.9 % 250 mL (0.32 mg/mL) infusion, 8 mg/hr, Intravenous, Continuous, Gouru, Aruna, MD, Last Rate: 25 mL/hr at 07/09/18 0800, 8 mg/hr at 07/09/18 0800   [MAR Hold] pantoprazole (PROTONIX) injection  40 mg, 40 mg, Intravenous, Q12H, Gouru, Aruna, MD   [MAR Hold] tiotropium (SPIRIVA) inhalation capsule ([facility] use ONLY) 18 mcg, 18 mcg, Inhalation, Daily, Gouru, Aruna, MD, 18 mcg at 07/09/18 0940   [MAR Hold] traMADol (ULTRAM) tablet 50 mg, 50 mg, Oral, Q6H PRN, Gouru, Aruna, MD, 50 mg at 07/09/18 0707   Family History  Problem Relation Age of Onset   Cancer Mother    Heart attack Father      Social History   Tobacco Use   Smoking status: Current Every Day Smoker    Packs/day: 0.50    Types: Cigarettes   Smokeless tobacco: Former Systems developer  Substance Use Topics   Alcohol use: No    Comment: 2-3 months   Drug use: No    Allergies as of 07/08/2018 - Review Complete 07/08/2018  Allergen Reaction Noted   Codeine Itching and Other (See  Comments) 10/26/2012   Indomethacin Itching 10/26/2012    Review of Systems:    All systems reviewed and negative except where noted in HPI.   Physical Exam:  Vital signs in last 24 hours: Temp:  [97.7 F (36.5 C)-98.5 F (36.9 C)] 97.7 F (36.5 C) (03/14 1001) Pulse Rate:  [65-77] 66 (03/14 1001) Resp:  [14-24] 18 (03/14 1001) BP: (98-113)/(53-73) 108/65 (03/14 1001) SpO2:  [93 %-96 %] 95 % (03/14 1001) Weight:  [103.4 kg] 103.4 kg (03/13 1718) Last BM Date: 07/08/18 General:   Pleasant, cooperative in NAD Head:  Normocephalic and atraumatic. Eyes:   No icterus.   Conjunctiva pink. PERRLA. Ears:  Normal auditory acuity. Neck:  Supple; no masses or thyroidomegaly Lungs: Respirations even and unlabored. Lungs clear to auscultation bilaterally.   No wheezes, crackles, or rhonchi.  Heart:  Regular rate and rhythm;  Without murmur, clicks, rubs or gallops Abdomen:  Soft, grossly distended, tympanic to percussion, nontender. Normal bowel sounds. No appreciable masses or hepatomegaly.  No rebound or guarding.  Rectal:  Not performed. Msk:  Symmetrical without gross deformities.  Strength normal Extremities:  Without edema, cyanosis or clubbing. Neurologic:  Alert and oriented x3;  grossly normal neurologically. Skin:  Intact without significant lesions or rashes. Psych:  Alert and cooperative. Normal affect.  LAB RESULTS: CBC Latest Ref Rng & Units 07/09/2018 07/09/2018 07/08/2018  WBC 4.0 - 10.5 K/uL 6.2 - -  Hemoglobin 13.0 - 17.0 g/dL 11.7(L) 11.8(L) 12.3(L)  Hematocrit 39.0 - 52.0 % 33.5(L) 34.2(L) 36.0(L)  Platelets 150 - 400 K/uL 76(L) - -    BMET BMP Latest Ref Rng & Units 07/09/2018 07/08/2018 02/03/2017  Glucose 70 - 99 mg/dL 110(H) 129(H) 121(H)  BUN 8 - 23 mg/dL '17 12 8  ' Creatinine 0.61 - 1.24 mg/dL 0.78 0.93 0.78  Sodium 135 - 145 mmol/L 133(L) 132(L) 127(L)  Potassium 3.5 - 5.1 mmol/L 4.2 4.0 4.4  Chloride 98 - 111 mmol/L 101 101 97(L)  CO2 22 - 32 mmol/L '25 24 25   ' Calcium 8.9 - 10.3 mg/dL 8.4(L) 8.6(L) 9.3    LFT Hepatic Function Latest Ref Rng & Units 07/09/2018 07/08/2018 01/22/2017  Total Protein 6.5 - 8.1 g/dL 6.7 7.2 7.6  Albumin 3.5 - 5.0 g/dL 2.6(L) 3.0(L) 3.4(L)  AST 15 - 41 U/L 129(H) 151(H) 110(H)  ALT 0 - 44 U/L 91(H) 108(H) 77(H)  Alk Phosphatase 38 - 126 U/L 72 85 89  Total Bilirubin 0.3 - 1.2 mg/dL 2.8(H) 2.8(H) 1.1  Bilirubin, Direct 0.1 - 0.5 mg/dL - - -     STUDIES: Ct Chest  W Contrast  Result Date: 07/08/2018 CLINICAL DATA:  Hemoptysis.  History of COPD and cirrhosis. EXAM: CT CHEST WITH CONTRAST TECHNIQUE: Multidetector CT imaging of the chest was performed during intravenous contrast administration. CONTRAST:  73m OMNIPAQUE IOHEXOL 300 MG/ML  SOLN COMPARISON:  Chest radiograph July 08, 2018 and CT chest December 07, 2011 and CT abdomen and pelvis in May 10, 2018 FINDINGS: CARDIOVASCULAR: Heart size is normal. No pericardial effusion. Moderate coronary artery calcifications. Thoracic aorta is normal course and caliber, mild intimal thickening and calcific atherosclerosis. No central pulmonary embolism though not tailored for evaluation. MEDIASTINUM/NODES: No mediastinal mass. No lymphadenopathy by CT size criteria. LUNGS/PLEURA: Tracheobronchial tree is patent, no pneumothorax. Bronchial wall thickening. RIGHT middle lobe bulla. Mild centrilobular emphysema. Stable stellate scarring LEFT lower lobe. UPPER ABDOMEN: Nodular cirrhotic liver with partially imaged splenomegaly and moderate ascites with mesenteric densities most compatible with varicosities. Subcentimeter layering cholelithiasis with wall thickening seen with portal hypertension. MUSCULOSKELETAL: Nonacute. Osteopenia with multiple old mild thoracic compression fractures. IMPRESSION: 1. Bronchial wall thickening seen with bronchitis or reactive airway disease. No pneumonia. 2. Similar cirrhosis and evidence of portal hypertension including moderate ascites. Aortic  Atherosclerosis (ICD10-I70.0) and Emphysema (ICD10-J43.9). Electronically Signed   By: CElon AlasM.D.   On: 07/08/2018 18:16   Dg Chest Portable 1 View  Result Date: 07/08/2018 CLINICAL DATA:  Hemoptysis EXAM: PORTABLE CHEST 1 VIEW COMPARISON:  June 27, 2018 FINDINGS: Lungs are mildly hyperexpanded without edema or consolidation. There are scattered areas of mild scarring. Heart size and pulmonary vascularity are normal. No adenopathy. No bone lesions. IMPRESSION: Lungs hyperexpanded with scattered areas of mild scarring. No edema or consolidation. Stable cardiac silhouette. No appreciable change from most recent study. Electronically Signed   By: WLowella GripIII M.D.   On: 07/08/2018 17:45      Impression / Plan:   Bradley EASOMis a 63y.o. male with decompensated alcohol, hepatitis C cirrhosis with bleeding esophageal varices in 2018 status post variceal ligation on propranolol for secondary variceal prophylaxis, ascites, history of HCC status post ablation presents with recurrence of hematemesis concern for variceal bleed  N.p.o. EGD today Continue pantoprazole and octreotide drips Ceftriaxone for SBP prophylaxis Ultrasound paracentesis with fluid analysis to rule out SBP Monitor CBC closely   I have discussed alternative options, risks & benefits,  which include, but are not limited to, bleeding, infection, perforation,respiratory complication & drug reaction.  The patient agrees with this plan & written consent will be obtained.    Thank you for involving me in the care of this patient.      LOS: 1 day   RSherri Sear MD  07/09/2018, 11:00 AM   Note: This dictation was prepared with Dragon dictation along with smaller phrase technology. Any transcriptional errors that result from this process are unintentional.

## 2018-07-09 NOTE — Anesthesia Procedure Notes (Signed)
Procedure Name: Intubation Date/Time: 07/09/2018 11:36 AM Performed by: Durenda Hurt, MD Pre-anesthesia Checklist: Patient identified, Emergency Drugs available, Suction available, Patient being monitored and Timeout performed Patient Re-evaluated:Patient Re-evaluated prior to induction Oxygen Delivery Method: Circle system utilized Preoxygenation: Pre-oxygenation with 100% oxygen Induction Type: IV induction, Cricoid Pressure applied and Rapid sequence Laryngoscope Size: McGraph and 3 Grade View: Grade I Tube type: Oral Tube size: 7.5 mm Number of attempts: 1 (brief atraumatic ) Airway Equipment and Method: Stylet and Video-laryngoscopy Placement Confirmation: ETT inserted through vocal cords under direct vision,  positive ETCO2 and breath sounds checked- equal and bilateral Secured at: 23 cm Tube secured with: Tape Dental Injury: Teeth and Oropharynx as per pre-operative assessment

## 2018-07-09 NOTE — Progress Notes (Signed)
15 minute call given to floor.  Patient encouraged to try not to cough, verbalized understanding.

## 2018-07-09 NOTE — Progress Notes (Signed)
Patient ID: Bradley Dunlap, male   DOB: [birth year], 63 y.o.   MRN: [id number]  [employer] PROGRESS NOTE  AYDEEN BLUME TGG:[id number] DOB: Feb 14, 1956 DOA: 07/08/2018 PCP: Ellene Route  HPI/Subjective: Patient states that he vomited up bright red blood.  States his bowel movements were dark but not black.  Some abdominal distention and discomfort.  Objective: Vitals:   07/09/18 1232 07/09/18 1432  BP: 106/64   Pulse: 81   Resp: (!) 22   Temp:    SpO2: 97% 94%    Filed Weights   07/08/18 1718  Weight: 103.4 kg    ROS: Review of Systems  Constitutional: Negative for chills and fever.  Eyes: Negative for blurred vision.  Respiratory: Positive for shortness of breath and wheezing. Negative for cough.   Cardiovascular: Negative for chest pain.  Gastrointestinal: Positive for abdominal pain, nausea and vomiting. Negative for constipation and diarrhea.  Genitourinary: Negative for dysuria.  Musculoskeletal: Negative for joint pain.  Neurological: Negative for dizziness and headaches.   Exam: Physical Exam  Constitutional: He is oriented to person, place, and time.  HENT:  Nose: No mucosal edema.  Mouth/Throat: No oropharyngeal exudate or posterior oropharyngeal edema.  Eyes: Pupils are equal, round, and reactive to light. Conjunctivae, EOM and lids are normal.  Neck: No JVD present. Carotid bruit is not present. No edema present. No thyroid mass and no thyromegaly present.  Cardiovascular: S1 normal and S2 normal. Exam reveals no gallop.  No murmur heard. Pulses:      Dorsalis pedis pulses are 2+ on the right side and 2+ on the left side.  Respiratory: No respiratory distress. He has decreased breath sounds in the right middle field, the right lower field, the left middle field and the left lower field. He has wheezes in the right middle field, the right lower field, the left middle field and the left lower field. He has no rhonchi. He has no rales.  GI: Soft.  Bowel sounds are normal. There is no abdominal tenderness.  Musculoskeletal:     Right ankle: He exhibits no swelling.     Left ankle: He exhibits no swelling.  Lymphadenopathy:    He has no cervical adenopathy.  Neurological: He is alert and oriented to person, place, and time. No cranial nerve deficit.  Skin: Skin is warm. No rash noted. Nails show no clubbing.  Psychiatric: He has a normal mood and affect.      Data Reviewed: Basic Metabolic Panel: Recent Labs  Lab 07/08/18 1723 07/09/18 0755  NA 132* 133*  K 4.0 4.2  CL 101 101  CO2 24 25  GLUCOSE 129* 110*  BUN 12 17  CREATININE 0.93 0.78  CALCIUM 8.6* 8.4*   Liver Function Tests: Recent Labs  Lab 07/08/18 1723 07/09/18 0755  AST 151* 129*  ALT 108* 91*  ALKPHOS 85 72  BILITOT 2.8* 2.8*  PROT 7.2 6.7  ALBUMIN 3.0* 2.6*   CBC: Recent Labs  Lab 07/08/18 1723 07/08/18 2336 07/09/18 0150 07/09/18 0755  WBC 8.1  --   --  6.2  NEUTROABS 5.0  --   --   --   HGB 13.3 12.3* 11.8* 11.7*  HCT 38.3* 36.0* 34.2* 33.5*  MCV 95.3  --   --  96.5  PLT 93*  --   --  76*     Studies: Ct Chest W Contrast  Result Date: 07/08/2018 CLINICAL DATA:  Hemoptysis.  History of COPD and cirrhosis. EXAM: CT CHEST WITH CONTRAST  TECHNIQUE: Multidetector CT imaging of the chest was performed during intravenous contrast administration. CONTRAST:  30mL OMNIPAQUE IOHEXOL 300 MG/ML  SOLN COMPARISON:  Chest radiograph July 08, 2018 and CT chest December 07, 2011 and CT abdomen and pelvis in May 10, 2018 FINDINGS: CARDIOVASCULAR: Heart size is normal. No pericardial effusion. Moderate coronary artery calcifications. Thoracic aorta is normal course and caliber, mild intimal thickening and calcific atherosclerosis. No central pulmonary embolism though not tailored for evaluation. MEDIASTINUM/NODES: No mediastinal mass. No lymphadenopathy by CT size criteria. LUNGS/PLEURA: Tracheobronchial tree is patent, no pneumothorax. Bronchial wall  thickening. RIGHT middle lobe bulla. Mild centrilobular emphysema. Stable stellate scarring LEFT lower lobe. UPPER ABDOMEN: Nodular cirrhotic liver with partially imaged splenomegaly and moderate ascites with mesenteric densities most compatible with varicosities. Subcentimeter layering cholelithiasis with wall thickening seen with portal hypertension. MUSCULOSKELETAL: Nonacute. Osteopenia with multiple old mild thoracic compression fractures. IMPRESSION: 1. Bronchial wall thickening seen with bronchitis or reactive airway disease. No pneumonia. 2. Similar cirrhosis and evidence of portal hypertension including moderate ascites. Aortic Atherosclerosis (ICD10-I70.0) and Emphysema (ICD10-J43.9). Electronically Signed   By: Elon Alas M.D.   On: 07/08/2018 18:16   Dg Chest Portable 1 View  Result Date: 07/08/2018 CLINICAL DATA:  Hemoptysis EXAM: PORTABLE CHEST 1 VIEW COMPARISON:  June 27, 2018 FINDINGS: Lungs are mildly hyperexpanded without edema or consolidation. There are scattered areas of mild scarring. Heart size and pulmonary vascularity are normal. No adenopathy. No bone lesions. IMPRESSION: Lungs hyperexpanded with scattered areas of mild scarring. No edema or consolidation. Stable cardiac silhouette. No appreciable change from most recent study. Electronically Signed   By: Lowella Grip III M.D.   On: 07/08/2018 17:45    Scheduled Meds: . albuterol  2.5 mg Nebulization Q6H  . mometasone-formoterol  2 puff Inhalation BID  . nicotine  14 mg Transdermal Daily  . [START ON 07/12/2018] pantoprazole  40 mg Intravenous Q12H  . tiotropium  18 mcg Inhalation Daily   Continuous Infusions: . sodium chloride 25 mL/hr at 07/09/18 0951  . cefTRIAXone (ROCEPHIN)  IV 2 g (07/09/18 0943)  . octreotide  (SANDOSTATIN)    IV infusion 50 mcg/hr (07/09/18 1124)  . pantoprozole (PROTONIX) infusion 8 mg/hr (07/09/18 0800)    Assessment/Plan:  1. Esophageal variceal bleed.  Continue Protonix and  octreotide drips.  Empiric Rocephin.  Endoscopy done today with banding.  Serial hemoglobin. 2. Liver cirrhosis with ascites, hepatitis C, thrombocytopenia, elevated liver function test we will schedule for paracentesis on Monday 3. COPD with wheeze.  Patient states he always wheezes.  Start on nebulizer treatments continue Advair and Spiriva 4. Chronic anxiety on Klonopin 5. Tobacco abuse.  Nicotine patch ordered 6. Left rib pain will get x-ray of the left ribs  Code Status:     Code Status Orders  (From admission, onward)         Start     Ordered   07/08/18 2150  Full code  Continuous     07/08/18 2150        Code Status History    Date Active Date Inactive Code Status Order ID Comments User Context   01/13/2017 0632 01/14/2017 1829 Full Code [id number]  Saundra Shelling, MD Inpatient   01/03/2017 1746 01/05/2017 1547 Full Code [id number]  Gladstone Lighter, MD Inpatient   12/24/2016 1337 12/27/2016 1834 Full Code [id number]  Fritzi Mandes, MD Inpatient     Disposition Plan: To be determined based on clinical course  Consultants:  Gastroenterology  Procedures:  Upper endoscopy  Time spent: 28 minutes  [place]

## 2018-07-09 NOTE — Op Note (Signed)
[facility] Gastroenterology Patient Name: Bradley Dunlap Procedure Date: 07/09/2018 10:11 AM MRN: [id number] Account #: [id number] Date of Birth: [birth year] Admit Type: Inpatient Age: 63 Room: [facility] ENDO ROOM 4 Gender: Male Note Status: Finalized Procedure:            Upper GI endoscopy Indications:          Esophageal varices, For therapy of esophageal varices,                        Cirrhosis with UGI bleeding suspected esophageal varices Providers:            Lin Landsman MD, MD Medicines:            General Anesthesia Complications:        No immediate complications. Estimated blood loss:                        Minimal. Procedure:            Pre-Anesthesia Assessment:                       - Prior to the procedure, a History and Physical was                        performed, and patient medications and allergies were                        reviewed. The patient is competent. The risks and                        benefits of the procedure and the sedation options and                        risks were discussed with the patient. All questions                        were answered and informed consent was obtained.                        Patient identification and proposed procedure were                        verified by the physician, the nurse, the                        anesthesiologist, the anesthetist and the technician in                        the pre-procedure area in the procedure room in the                        endoscopy suite. Mental Status Examination: alert and                        oriented. Airway Examination: normal oropharyngeal                        airway and neck mobility. Respiratory Examination:  clear to auscultation. CV Examination: normal.                        Prophylactic Antibiotics: The patient does not require                        prophylactic antibiotics. Prior Anticoagulants: The      patient has taken no previous anticoagulant or                        antiplatelet agents. ASA Grade Assessment: IV - A                        patient with severe systemic disease that is a constant                        threat to life. After reviewing the risks and benefits,                        the patient was deemed in satisfactory condition to                        undergo the procedure. The anesthesia plan was to use                        general anesthesia. Immediately prior to administration                        of medications, the patient was re-assessed for                        adequacy to receive sedatives. The heart rate,                        respiratory rate, oxygen saturations, blood pressure,                        adequacy of pulmonary ventilation, and response to care                        were monitored throughout the procedure. The physical                        status of the patient was re-assessed after the                        procedure.                       After obtaining informed consent, the endoscope was                        passed under direct vision. Throughout the procedure,                        the patient's blood pressure, pulse, and oxygen                        saturations were monitored continuously. The Endoscope  was introduced through the mouth, and advanced to the                        second part of duodenum. The Endoscope was introduced                        through the mouth, and advanced to the second part of                        duodenum. The upper GI endoscopy was accomplished                        without difficulty. The patient tolerated the procedure                        well. Findings:      Four columns of non-bleeding large (> 5 mm) varices were found in the       middle third of the esophagus and in the lower third of the esophagus,       39 cm from the incisors. Stigmata of recent  bleeding, ulcer at the       recently bled varix were evident and red wale signs were present. No       evidence of response to prior treatment was visible. The varices       appeared larger than they were at prior exam. Three bands were       successfully placed with complete eradication, resulting in deflation of       varices. There was no bleeding at the end of the procedure.      Moderate, diffuse portal hypertensive gastropathy was found in the       entire examined stomach.      Diffuse severely erythematous mucosa without active bleeding and oozing       with scope trauma was found in the duodenal bulb and in the second       portion of the duodenum consistent with portal duodenopathy. Impression:           - Recently bleeding large (> 5 mm) esophageal varices.                        Completely eradicated. Banded.                       - Portal hypertensive gastropathy.                       - Erythematous duodenopathy.                       - No specimens collected. Recommendation:       - Return patient to hospital ward for ongoing care.                       - Clear liquid diet today.                       - Continue PPI and octreotide drips for 72hrs                       - Continue ceftriaxone for SBP prophylaxis                       -  Recommend evaluation for TIPS as outpt he has                        recurrent variceal bleeding despite being on secondary                        variceal prophylaxis                       - Treat his chronic hep C Procedure Code(s):    --- Professional ---                       [phone], Esophagogastroduodenoscopy, flexible, transoral;                        with band ligation of esophageal/gastric varices Diagnosis Code(s):    --- Professional ---                       K74.60, Unspecified cirrhosis of liver                       I85.11, Secondary esophageal varices with bleeding                       K76.6, Portal hypertension                        K31.89, Other diseases of stomach and duodenum CPT copyright 2018 American Medical Association. All rights reserved. The codes documented in this report are preliminary and upon coder review may  be revised to meet current compliance requirements. Dr. Ulyess Mort Lin Landsman MD, MD 07/09/2018 12:10:33 PM This report has been signed electronically. Number of Addenda: 0 Note Initiated On: 07/09/2018 10:11 AM      [facility]

## 2018-07-09 NOTE — Transfer of Care (Signed)
Immediate Anesthesia Transfer of Care Note  Patient: Bradley Dunlap  Procedure(s) Performed: ESOPHAGOGASTRODUODENOSCOPY (EGD) (N/A )  Patient Location: PACU  Anesthesia Type:General  Level of Consciousness: awake, alert , oriented and patient cooperative  Airway & Oxygen Therapy: Patient Spontanous Breathing and Patient connected to nasal cannula oxygen  Post-op Assessment: Report given to RN and Post -op Vital signs reviewed and stable  Post vital signs: Reviewed and stable  Last Vitals:  Vitals Value Taken Time  BP 105/70 07/09/2018 12:17 PM  Temp 36.6 C 07/09/2018 12:17 PM  Pulse 90 07/09/2018 12:21 PM  Resp 16 07/09/2018 12:21 PM  SpO2 95 % 07/09/2018 12:21 PM  Vitals shown include unvalidated device data.  Last Pain:  Vitals:   07/09/18 1217  TempSrc:   PainSc: 2          Complications: No apparent anesthesia complications

## 2018-07-09 NOTE — Anesthesia Preprocedure Evaluation (Addendum)
Anesthesia Evaluation  Patient identified by MRN, date of birth, ID band Patient awake    Reviewed: Allergy & Precautions, H&P , NPO status , Patient's Chart, lab work & pertinent test results  Airway Mallampati: III       Dental  (+) Chipped, Missing, Poor Dentition Multiple broken teeth:   Pulmonary COPD, Current Smoker,           Cardiovascular hypertension,      Neuro/Psych PSYCHIATRIC DISORDERS Anxiety negative neurological ROS     GI/Hepatic GERD  ,(+) Cirrhosis   Esophageal Varices and ascites  substance abuse  alcohol use, Hepatitis -, CLiver cancer   Endo/Other  negative endocrine ROS  Renal/GU      Musculoskeletal   Abdominal   Peds  Hematology  (+) Blood dyscrasia, anemia ,   Anesthesia Other Findings Presented after episode of bright red emesis vs hemoptysis  Past Medical History: No date: Alcohol abuse No date: Alcohol use No date: Anemia No date: Anxiety No date: Arthritis No date: Ascites No date: Cancer [facility])     Comment:  liver cancer No date: Chronic hepatitis C (HCC) No date: COPD (chronic obstructive pulmonary disease) (HCC) No date: Esophageal varices (HCC) No date: GERD (gastroesophageal reflux disease) No date: Hepatitis C No date: Hypertension No date: Illiterate     Comment:  cannot read or write No date: Liver cirrhosis ([place]) No date: Peripheral vascular disease ([place]) No date: Tobacco use  Past Surgical History: No date: BACK SURGERY 01/21/2018: COLONOSCOPY WITH PROPOFOL; N/A     Comment:  Procedure: COLONOSCOPY WITH PROPOFOL;  Surgeon:               [name], MD;  Location: [facility] ENDOSCOPY;                Service: Endoscopy;  Laterality: N/A; 01/04/2017: ESOPHAGOGASTRODUODENOSCOPY; N/A     Comment:  Procedure: ESOPHAGOGASTRODUODENOSCOPY (EGD);  Surgeon:               Lin Landsman, MD;  Location: [facility] ENDOSCOPY;                Service: Gastroenterology;   Laterality: N/A; 01/22/2017: ESOPHAGOGASTRODUODENOSCOPY (EGD) WITH PROPOFOL; N/A     Comment:  Procedure: ESOPHAGOGASTRODUODENOSCOPY (EGD) WITH               PROPOFOL;  Surgeon: Tol[place], Benay Pike, MD;  Location:               [facility] ENDOSCOPY;  Service: Gastroenterology;  Laterality:               N/A; No date: PARACENTESIS No date: SPINE SURGERY     Comment:  lower lumbar fused  BMI    Body Mass Index:  29.27 kg/m      Reproductive/Obstetrics negative OB ROS                           Anesthesia Physical Anesthesia Plan  ASA: IV  Anesthesia Plan: General ETT   Post-op Pain Management:    Induction: Rapid sequence  PONV Risk Score and Plan: Ondansetron, Dexamethasone and Treatment may vary due to age or medical condition  Airway Management Planned: Oral ETT  Additional Equipment:   Intra-op Plan:   Post-operative Plan:   Informed Consent: I have reviewed the patients History and Physical, chart, labs and discussed the procedure including the risks, benefits and alternatives for the proposed anesthesia with the patient or authorized  representative who has indicated his/her understanding and acceptance.     Dental Advisory Given  Plan Discussed with: Anesthesiologist and CRNA  Anesthesia Plan Comments:         Anesthesia Quick Evaluation

## 2018-07-09 NOTE — Anesthesia Postprocedure Evaluation (Signed)
Anesthesia Post Note  Patient: Bradley Dunlap  Procedure(s) Performed: ESOPHAGOGASTRODUODENOSCOPY (EGD) (N/A )  Patient location during evaluation: PACU Anesthesia Type: General Level of consciousness: awake and alert Pain management: pain level controlled Vital Signs Assessment: post-procedure vital signs reviewed and stable Respiratory status: spontaneous breathing, nonlabored ventilation, respiratory function stable and patient connected to nasal cannula oxygen Cardiovascular status: blood pressure returned to baseline and stable Postop Assessment: no apparent nausea or vomiting Anesthetic complications: no     Last Vitals:  Vitals:   07/09/18 1217 07/09/18 1223  BP: 105/70 104/67  Pulse: 85 89  Resp: (!) 23 (!) 23  Temp: 36.6 C   SpO2: 96% 96%    Last Pain:  Vitals:   07/09/18 1217  TempSrc:   PainSc: [place]

## 2018-07-09 NOTE — Anesthesia Post-op Follow-up Note (Signed)
Anesthesia QCDR form completed.        

## 2018-07-10 DIAGNOSIS — K7460 Unspecified cirrhosis of liver: Secondary | ICD-10-CM

## 2018-07-10 DIAGNOSIS — R188 Other ascites: Secondary | ICD-10-CM

## 2018-07-10 LAB — CBC
HCT: 34.2 % — ABNORMAL LOW (ref 39.0–52.0)
Hemoglobin: 11.7 g/dL — ABNORMAL LOW (ref 13.0–17.0)
MCH: 33.4 pg (ref 26.0–34.0)
MCHC: 34.2 g/dL (ref 30.0–36.0)
MCV: 97.7 fL (ref 80.0–100.0)
Platelets: 78 10*3/uL — ABNORMAL LOW (ref 150–400)
RBC: 3.5 MIL/uL — ABNORMAL LOW (ref 4.22–5.81)
RDW: 16 % — ABNORMAL HIGH (ref 11.5–15.5)
WBC: 7.1 10*3/uL (ref 4.0–10.5)
nRBC: 0 % (ref 0.0–0.2)

## 2018-07-10 LAB — FOLATE: Folate: 11.4 ng/mL (ref >5.9)

## 2018-07-10 LAB — COMPREHENSIVE METABOLIC PANEL
ALBUMIN: 2.6 g/dL — AB (ref 3.5–5.0)
ALT: 85 U/L — ABNORMAL HIGH (ref 0–44)
AST: 119 U/L — AB (ref 15–41)
Alkaline Phosphatase: 67 U/L (ref 38–126)
Anion gap: 5 (ref 5–15)
BUN: 17 mg/dL (ref 8–23)
CO2: 24 mmol/L (ref 22–32)
CREATININE: 0.83 mg/dL (ref 0.61–1.24)
Calcium: 8.5 mg/dL — ABNORMAL LOW (ref 8.9–10.3)
Chloride: 103 mmol/L (ref 98–111)
GFR calc Af Amer: >60 mL/min (ref >60)
GFR calc non Af Amer: >60 mL/min (ref >60)
Glucose, Bld: 123 mg/dL — ABNORMAL HIGH (ref 70–99)
Potassium: 3.9 mmol/L (ref 3.5–5.1)
Sodium: 132 mmol/L — ABNORMAL LOW (ref 135–145)
Total Bilirubin: 2.5 mg/dL — ABNORMAL HIGH (ref 0.3–1.2)
Total Protein: 6.6 g/dL (ref 6.5–8.1)

## 2018-07-10 LAB — HIV ANTIBODY (ROUTINE TESTING W REFLEX): HIV Screen 4th Generation wRfx: NONREACTIVE

## 2018-07-10 LAB — IRON AND TIBC
Iron: 168 ug/dL (ref 45–182)
SATURATION RATIOS: 55 % — AB (ref 17.9–39.5)
TIBC: 307 ug/dL (ref 250–450)
UIBC: 139 ug/dL

## 2018-07-10 LAB — FERRITIN: Ferritin: 127 ng/mL (ref 24–336)

## 2018-07-10 MED ORDER — FUROSEMIDE 10 MG/ML IJ SOLN
40.0000 mg | Freq: Once | INTRAMUSCULAR | Status: AC
  Administered 2018-07-10: 40 mg via INTRAVENOUS
  Filled 2018-07-10: qty 4

## 2018-07-10 MED ORDER — SPIRONOLACTONE 25 MG PO TABS
100.0000 mg | ORAL_TABLET | Freq: Every day | ORAL | Status: DC
  Administered 2018-07-10 – 2018-07-15 (×5): 100 mg via ORAL
  Filled 2018-07-10 (×5): qty 4

## 2018-07-10 MED ORDER — FUROSEMIDE 10 MG/ML IJ SOLN: 40.0000 mg | Freq: Once | INTRAMUSCULAR | Status: DC

## 2018-07-10 MED ORDER — FUROSEMIDE 40 MG PO TABS
40.0000 mg | ORAL_TABLET | Freq: Every day | ORAL | Status: DC
  Administered 2018-07-10: 40 mg via ORAL
  Filled 2018-07-10: qty 1

## 2018-07-10 MED ORDER — MAGNESIUM CITRATE PO SOLN
1.0000 | Freq: Once | ORAL | Status: AC
  Administered 2018-07-10: 1 via ORAL
  Filled 2018-07-10: qty 296

## 2018-07-10 MED ORDER — FUROSEMIDE 40 MG PO TABS
40.0000 mg | ORAL_TABLET | Freq: Two times a day (BID) | ORAL | Status: DC
  Administered 2018-07-11 – 2018-07-15 (×8): 40 mg via ORAL
  Filled 2018-07-10 (×10): qty 1

## 2018-07-10 MED ORDER — POLYETHYLENE GLYCOL 3350 17 G PO PACK
17.0000 g | PACK | Freq: Two times a day (BID) | ORAL | Status: DC
  Administered 2018-07-10 – 2018-07-12 (×5): 17 g via ORAL
  Filled 2018-07-10 (×5): qty 1

## 2018-07-10 NOTE — Evaluation (Signed)
Physical Therapy Evaluation Patient Details Name: SHAFT CORIGLIANO MRN: [id number] DOB: 06-21-1955 Today's Date: 07/10/2018   History of Present Illness  63 yo male with onset of abd pain, nausea and cirrhosis with ascites is scheduled to have a paracentesis tomorrow, but currently noted bronchitis an cleared with chest x-ray.  PMHx:  EtOH abuse, anemia, ascites, anxiety, liver CA, Hep C, HTN, PVD, illiterate  Clinical Impression  Pt is up to walk with a great deal of pain but is having a paracentesis on 3/16.  Anticipate pain relief and will reattempt gait with the procedure over to assess its' success in relieving pain. Follow acutely otherwise for endurance, balance and quality of gait.    Follow Up Recommendations Home health PT;Supervision for mobility/OOB    Equipment Recommendations  Rolling walker with 5" wheels    Recommendations for Other Services       Precautions / Restrictions Precautions Precautions: Fall Precaution Comments: monitor O2 sats Restrictions Weight Bearing Restrictions: No      Mobility  Bed Mobility Overal bed mobility: Modified Independent                Transfers Overall transfer level: Modified independent Equipment used: Rolling walker (2 wheeled)                Ambulation/Gait Ambulation/Gait assistance: Min guard Gait Distance (Feet): 120 Feet Assistive device: Rolling walker (2 wheeled);1 person hand held assist Gait Pattern/deviations: Step-through pattern;Decreased stride length;Wide base of support;Trunk flexed Gait velocity: reduced Gait velocity interpretation: <1.31 ft/sec, indicative of household ambulator General Gait Details: modified to manage his pain in abdomen  Stairs            Wheelchair Mobility    Modified Rankin (Stroke Patients Only)       Balance                                             Pertinent Vitals/Pain Pain Assessment: 0-10 Pain Score: 8  Pain Location:  abdominal pain from fluid Pain Descriptors / Indicators: Pressure Pain Intervention(s): Monitored during session;Premedicated before session;Repositioned    Home Living Family/patient expects to be discharged to:: Private residence Living Arrangements: Alone Available Help at Discharge: Family;Friend(s);Available PRN/intermittently Type of Home: House Home Access: Stairs to enter   [employer] of Steps: 2 Home Layout: One level Home Equipment: Cane - single point Additional Comments: has been able to walk outdoors with [facility]    Prior Function Level of Independence: Independent with assistive device(s)               Hand Dominance   Dominant Hand: Right    Extremity/Trunk Assessment   Upper Extremity Assessment Upper Extremity Assessment: Overall WFL for tasks assessed    Lower Extremity Assessment Lower Extremity Assessment: Overall WFL for tasks assessed       Communication   Communication: No difficulties  Cognition Arousal/Alertness: Awake/alert Behavior During Therapy: WFL for tasks assessed/performed Overall Cognitive Status: Within Functional Limits for tasks assessed                                        General Comments      Exercises     Assessment/Plan    PT Assessment Patient needs continued PT services  PT Problem List  Decreased range of motion;Decreased activity tolerance;Decreased balance;Decreased coordination;Decreased mobility;Decreased knowledge of use of DME;Decreased safety awareness;Cardiopulmonary status limiting activity;Obesity;Pain       PT Treatment Interventions DME instruction;Gait training;Stair training;Functional mobility training;Therapeutic activities;Therapeutic exercise;Balance training;Neuromuscular re-education;Patient/family education    PT Goals (Current goals can be found in the Care Plan section)  Acute Rehab PT Goals Patient Stated Goal: to relieve pain PT Goal Formulation: With  patient Time For Goal Achievement: 07/17/18 Potential to Achieve Goals: Good    Frequency Min 2X/week   Barriers to discharge Inaccessible home environment;Decreased caregiver support home alone with stairs    Co-evaluation               AM-PAC PT "6 Clicks" Mobility  Outcome Measure Help needed turning from your back to your side while in a flat bed without using bedrails?: A Little Help needed moving from lying on your back to sitting on the side of a flat bed without using bedrails?: A Little Help needed moving to and from a bed to a chair (including a wheelchair)?: A Little Help needed standing up from a chair using your arms (e.g., wheelchair or bedside chair)?: A Little Help needed to walk in hospital room?: A Little Help needed climbing 3-5 steps with a railing? : A Lot 6 Click Score: 17    End of Session Equipment Utilized During Treatment: Gait belt Activity Tolerance: Patient tolerated treatment well;Patient limited by pain Patient left: in bed;with call bell/phone within reach;with bed alarm set Nurse Communication: Mobility status PT Visit Diagnosis: Unsteadiness on feet (R26.81);Other abnormalities of gait and mobility (R26.89)    Time: 8841-6606 PT Time Calculation (min) (ACUTE ONLY): 30 min   Charges:   PT Evaluation $PT Eval Moderate Complexity: 1 Mod PT Treatments $Gait Training: 8-22 mins       Ramond Dial 07/10/2018, 11:56 PM  Mee Hives, PT MS Acute Rehab Dept. Number: [place]

## 2018-07-10 NOTE — Progress Notes (Addendum)
Bradley Darby, MD [address]  [place]  [place], [place] [address]  Main: [phone]  Fax: [phone] Pager: [phone]   Subjective: Patient reported that he had vomited scant amount of blood last night.  He reports worsening of abdominal distention, feels constipated.  Afebrile, reports abdominal discomfort  Objective: Vital signs in last 24 hours: Vitals:   07/10/18 0524 07/10/18 0818 07/10/18 1239 07/10/18 1348  BP: 115/79  131/77   Pulse: 79  77   Resp: 20  18   Temp: (!) 97.5 F (36.4 C)  98.2 F (36.8 C)   TempSrc: Oral  Oral   SpO2: 91% 91% 92% 90%  Weight:      Height:       Weight change:   Intake/Output Summary (Last 24 hours) at 07/10/2018 1524 Last data filed at 07/10/2018 1517 Gross per 24 hour  Intake 1727.1 ml  Output 450 ml  Net 1277.1 ml     Exam: Heart:: Regular rate and rhythm or S1S2 present Lungs: normal and clear to auscultation Abdomen: soft, nontender, grossly distended, dull in bilateral flanks consistent with ascites, tympanic to percussion in the central abdomen, normal bowel sounds   Lab Results: CBC Latest Ref Rng & Units 07/10/2018 07/09/2018 07/09/2018  WBC 4.0 - 10.5 K/uL 7.1 6.2 -  Hemoglobin 13.0 - 17.0 g/dL 11.7(L) 11.7(L) 11.8(L)  Hematocrit 39.0 - 52.0 % 34.2(L) 33.5(L) 34.2(L)  Platelets 150 - 400 K/uL 78(L) 76(L) -   CMP Latest Ref Rng & Units 07/10/2018 07/09/2018 07/08/2018  Glucose 70 - 99 mg/dL 123(H) 110(H) 129(H)  BUN 8 - 23 mg/dL 17 17 12   Creatinine 0.61 - 1.24 mg/dL 0.83 0.78 0.93  Sodium 135 - 145 mmol/L 132(L) 133(L) 132(L)  Potassium 3.5 - 5.1 mmol/L 3.9 4.2 4.0  Chloride 98 - 111 mmol/L 103 101 101  CO2 22 - 32 mmol/L 24 25 24   Calcium 8.9 - 10.3 mg/dL 8.5(L) 8.4(L) 8.6(L)  Total Protein 6.5 - 8.1 g/dL 6.6 6.7 7.2  Total Bilirubin 0.3 - 1.2 mg/dL 2.5(H) 2.8(H) 2.8(H)  Alkaline Phos 38 - 126 U/L 67 72 85  AST 15 - 41 U/L 119(H) 129(H) 151(H)  ALT 0 - 44 U/L 85(H) 91(H) 108(H)   Micro  Results: No results found for this or any previous visit (from the past 240 hour(s)). Studies/Results: Dg Ribs Unilateral Left  Result Date: 07/09/2018 CLINICAL DATA:  Pain following cough EXAM: LEFT RIBS - 2 VIEW COMPARISON:  Chest radiograph and chest CT July 08, 2018 FINDINGS: Oblique and cone-down rib images were obtained. No rib fracture evident. Left lung clear. Cardiac silhouette within normal limits. No left-sided pneumothorax or pleural effusion. IMPRESSION: No demonstrable rib fracture.  Left lung clear. Electronically Signed   By: Lowella Grip III M.D.   On: 07/09/2018 16:20   Ct Chest W Contrast  Result Date: 07/08/2018 CLINICAL DATA:  Hemoptysis.  History of COPD and cirrhosis. EXAM: CT CHEST WITH CONTRAST TECHNIQUE: Multidetector CT imaging of the chest was performed during intravenous contrast administration. CONTRAST:  60mL OMNIPAQUE IOHEXOL 300 MG/ML  SOLN COMPARISON:  Chest radiograph July 08, 2018 and CT chest December 07, 2011 and CT abdomen and pelvis in May 10, 2018 FINDINGS: CARDIOVASCULAR: Heart size is normal. No pericardial effusion. Moderate coronary artery calcifications. Thoracic aorta is normal course and caliber, mild intimal thickening and calcific atherosclerosis. No central pulmonary embolism though not tailored for evaluation. MEDIASTINUM/NODES: No mediastinal mass. No lymphadenopathy by CT size criteria. LUNGS/PLEURA:  Tracheobronchial tree is patent, no pneumothorax. Bronchial wall thickening. RIGHT middle lobe bulla. Mild centrilobular emphysema. Stable stellate scarring LEFT lower lobe. UPPER ABDOMEN: Nodular cirrhotic liver with partially imaged splenomegaly and moderate ascites with mesenteric densities most compatible with varicosities. Subcentimeter layering cholelithiasis with wall thickening seen with portal hypertension. MUSCULOSKELETAL: Nonacute. Osteopenia with multiple old mild thoracic compression fractures. IMPRESSION: 1. Bronchial wall thickening  seen with bronchitis or reactive airway disease. No pneumonia. 2. Similar cirrhosis and evidence of portal hypertension including moderate ascites. Aortic Atherosclerosis (ICD10-I70.0) and Emphysema (ICD10-J43.9). Electronically Signed   By: Elon Alas M.D.   On: 07/08/2018 18:16   Dg Chest Portable 1 View  Result Date: 07/08/2018 CLINICAL DATA:  Hemoptysis EXAM: PORTABLE CHEST 1 VIEW COMPARISON:  June 27, 2018 FINDINGS: Lungs are mildly hyperexpanded without edema or consolidation. There are scattered areas of mild scarring. Heart size and pulmonary vascularity are normal. No adenopathy. No bone lesions. IMPRESSION: Lungs hyperexpanded with scattered areas of mild scarring. No edema or consolidation. Stable cardiac silhouette. No appreciable change from most recent study. Electronically Signed   By: Lowella Grip III M.D.   On: 07/08/2018 17:45   Medications:  I have reviewed the patient's current medications. Prior to Admission:  Medications Prior to Admission  Medication Sig Dispense Refill Last Dose   albuterol (PROVENTIL HFA;VENTOLIN HFA) 108 (90 Base) MCG/ACT inhaler Inhale 2 puffs into the lungs every 4 (four) hours as needed for wheezing. 1 Inhaler 0 prn at prn   albuterol (PROVENTIL) (2.5 MG/3ML) 0.083% nebulizer solution Take 3 mLs (2.5 mg total) by nebulization every 4 (four) hours as needed for wheezing. 100 vial 0 07/08/2018 at 1300   clonazePAM (KLONOPIN) 0.5 MG tablet Take 1 tablet (0.5 mg total) by mouth 2 (two) times daily as needed for anxiety. 20 tablet 0 prn at prn   esomeprazole (NEXIUM) 40 MG capsule Take 40 mg by mouth daily.   07/08/2018 at 0900   Fluticasone-Salmeterol (ADVAIR DISKUS) 250-50 MCG/DOSE AEPB Inhale 1 puff into the lungs 2 (two) times daily. 60 each 0 07/08/2018 at 0900   furosemide (LASIX) 40 MG tablet Take 1 tablet (40 mg total) by mouth daily. (Patient taking differently: Take 20 mg by mouth daily. ) 30 tablet 1 07/08/2018 at 0900   propranolol  (INDERAL) 10 MG tablet Take 10 mg by mouth 2 (two) times daily.   07/08/2018 at 0900   traMADol (ULTRAM) 50 MG tablet Take 1 tablet (50 mg total) by mouth every 6 (six) hours as needed for moderate pain. 20 tablet 0 07/08/2018 at 1200   tiotropium (SPIRIVA HANDIHALER) 18 MCG inhalation capsule Place 1 capsule (18 mcg total) into inhaler and inhale daily. 30 capsule 0 01/21/2018 at Unknown time   Scheduled:  albuterol  2.5 mg Nebulization Q6H   [START ON 07/11/2018] furosemide  40 mg Oral BID   mometasone-formoterol  2 puff Inhalation BID   nicotine  14 mg Transdermal Daily   [START ON 07/12/2018] pantoprazole  40 mg Intravenous Q12H   polyethylene glycol  17 g Oral BID   spironolactone  100 mg Oral Daily   tiotropium  18 mcg Inhalation Daily   Continuous:  cefTRIAXone (ROCEPHIN)  IV Stopped (07/10/18 0657)   octreotide  (SANDOSTATIN)    IV infusion 50 mcg/hr (07/10/18 1517)   pantoprozole (PROTONIX) infusion 8 mg/hr (07/10/18 1517)   PPI:RJJOACZYSA, HYDROmorphone (DILAUDID) injection, ondansetron **OR** ondansetron (ZOFRAN) IV, traMADol Anti-infectives (From admission, onward)   Start     Dose/Rate Route  Frequency Ordered Stop   07/09/18 0700  cefTRIAXone (ROCEPHIN) 2 g in sodium chloride 0.9 % 100 mL IVPB     2 g 200 mL/hr over 30 Minutes Intravenous Every 24 hours 07/09/18 0651       Scheduled Meds:  albuterol  2.5 mg Nebulization Q6H   [START ON 07/11/2018] furosemide  40 mg Oral BID   mometasone-formoterol  2 puff Inhalation BID   nicotine  14 mg Transdermal Daily   [START ON 07/12/2018] pantoprazole  40 mg Intravenous Q12H   polyethylene glycol  17 g Oral BID   spironolactone  100 mg Oral Daily   tiotropium  18 mcg Inhalation Daily   Continuous Infusions:  cefTRIAXone (ROCEPHIN)  IV Stopped (07/10/18 0657)   octreotide  (SANDOSTATIN)    IV infusion 50 mcg/hr (07/10/18 1517)   pantoprozole (PROTONIX) infusion 8 mg/hr (07/10/18 1517)   PRN  Meds:.clonazePAM, HYDROmorphone (DILAUDID) injection, ondansetron **OR** ondansetron (ZOFRAN) IV, traMADol   Assessment: Active Problems:   Hematemesis  Bradley Dunlap is a 63 y.o. male with decompensated alcohol, hepatitis C cirrhosis with bleeding esophageal varices in 2018 status post variceal ligation on propranolol for secondary variceal prophylaxis, ascites, history of HCC status post ablation presents with recurrence of hematemesis status post EGD 07/09/2018 revealed large esophageal varices with stigmata of recent bleeding status post variceal ligation x3 With no recurrence of large hematemesis in last 24 hours.  Hemoglobin has been stable  Plan: Esophageal variceal bleed s/p EGD with ligation x3 Continue octreotide drip for 72 hours total, day 2 Protonix 40 mg IV twice daily He will restart home dose Nexium 40 mg daily upon discharge Continue ceftriaxone for SBP prophylaxis until discharge Monitor CBC daily Outpatient GI should consider evaluation for TIPS placement due to recurrent variceal bleed despite being on secondary variceal prophylaxis with nonselective beta-blocker.  Unclear if patient is adherent to this medication.  He will be a good candidate for TIPS placement Resume propranolol at 20mg  BID upon discharge  Ascites Increase furosemide to 40 mg twice daily Start spironolactone 100 mg daily Scheduled for paracentesis tomorrow with fluid analysis ordered Administer albumin 50 g if more than 4 L of fluid removed  Rest of the cirrhosis care as outpatient  Chronic constipation Magnesium citrate x1 MiraLAX 17 g twice daily   Follow-up with [facility] clinic GI upon discharge  Dr. Allen Norris to cover from tomorrow     LOS: 2 days   Markisha Meding 07/10/2018, 3:24 PM

## 2018-07-10 NOTE — Progress Notes (Signed)
Patient ID: Bradley Dunlap, male   DOB: 1956/02/27, 63 y.o.   MRN: [id number]  [employer] PROGRESS NOTE  STANLEE ROEHRIG TKP:[id number] DOB: [birth year] DOA: 07/08/2018 PCP: Ellene Route  HPI/Subjective: Patient stated last night he did cough and spit up blood.  Still having some abdominal pain.  Always has wheezing.  Still having pain in the ribs.  Objective: Vitals:   07/10/18 1239 07/10/18 1348  BP: 131/77   Pulse: 77   Resp: 18   Temp: 98.2 F (36.8 C)   SpO2: 92% 90%    Filed Weights   07/08/18 1718  Weight: 103.4 kg    ROS: Review of Systems  Constitutional: Negative for chills and fever.  Eyes: Negative for blurred vision.  Respiratory: Positive for shortness of breath and wheezing. Negative for cough.   Cardiovascular: Negative for chest pain.  Gastrointestinal: Positive for abdominal pain. Negative for constipation, diarrhea, nausea and vomiting.  Genitourinary: Negative for dysuria.  Musculoskeletal: Negative for joint pain.  Neurological: Negative for dizziness and headaches.   Exam: Physical Exam  Constitutional: He is oriented to person, place, and time.  HENT:  Nose: No mucosal edema.  Mouth/Throat: No oropharyngeal exudate or posterior oropharyngeal edema.  Eyes: Pupils are equal, round, and reactive to light. Conjunctivae, EOM and lids are normal.  Neck: No JVD present. Carotid bruit is not present. No edema present. No thyroid mass and no thyromegaly present.  Cardiovascular: S1 normal and S2 normal. Exam reveals no gallop.  No murmur heard. Pulses:      Dorsalis pedis pulses are 2+ on the right side and 2+ on the left side.  Respiratory: No respiratory distress. He has decreased breath sounds in the right middle field, the right lower field, the left middle field and the left lower field. He has wheezes in the right middle field, the right lower field, the left middle field and the left lower field. He has no rhonchi. He has no rales.  GI:  Soft. Bowel sounds are normal. He exhibits distension. There is abdominal tenderness.  Musculoskeletal:     Right ankle: He exhibits no swelling.     Left ankle: He exhibits no swelling.  Lymphadenopathy:    He has no cervical adenopathy.  Neurological: He is alert and oriented to person, place, and time. No cranial nerve deficit.  Skin: Skin is warm. No rash noted. Nails show no clubbing.  Psychiatric: He has a normal mood and affect.      Data Reviewed: Basic Metabolic Panel: Recent Labs  Lab 07/08/18 1723 07/09/18 0755 07/10/18 0555  NA 132* 133* 132*  K 4.0 4.2 3.9  CL 101 101 103  CO2 24 25 24   GLUCOSE 129* 110* 123*  BUN 12 17 17   CREATININE 0.93 0.78 0.83  CALCIUM 8.6* 8.4* 8.5*   Liver Function Tests: Recent Labs  Lab 07/08/18 1723 07/09/18 0755 07/10/18 0555  AST 151* 129* 119*  ALT 108* 91* 85*  ALKPHOS 85 72 67  BILITOT 2.8* 2.8* 2.5*  PROT 7.2 6.7 6.6  ALBUMIN 3.0* 2.6* 2.6*   CBC: Recent Labs  Lab 07/08/18 1723 07/08/18 2336 07/09/18 0150 07/09/18 0755 07/10/18 0555  WBC 8.1  --   --  6.2 7.1  NEUTROABS 5.0  --   --   --   --   HGB 13.3 12.3* 11.8* 11.7* 11.7*  HCT 38.3* 36.0* 34.2* 33.5* 34.2*  MCV 95.3  --   --  96.5 97.7  PLT 93*  --   --  76* 78*     Studies: Dg Ribs Unilateral Left  Result Date: 07/09/2018 CLINICAL DATA:  Pain following cough EXAM: LEFT RIBS - 2 VIEW COMPARISON:  Chest radiograph and chest CT July 08, 2018 FINDINGS: Oblique and cone-down rib images were obtained. No rib fracture evident. Left lung clear. Cardiac silhouette within normal limits. No left-sided pneumothorax or pleural effusion. IMPRESSION: No demonstrable rib fracture.  Left lung clear. Electronically Signed   By: Lowella Grip III M.D.   On: 07/09/2018 16:20   Ct Chest W Contrast  Result Date: 07/08/2018 CLINICAL DATA:  Hemoptysis.  History of COPD and cirrhosis. EXAM: CT CHEST WITH CONTRAST TECHNIQUE: Multidetector CT imaging of the chest was  performed during intravenous contrast administration. CONTRAST:  44mL OMNIPAQUE IOHEXOL 300 MG/ML  SOLN COMPARISON:  Chest radiograph July 08, 2018 and CT chest December 07, 2011 and CT abdomen and pelvis in May 10, 2018 FINDINGS: CARDIOVASCULAR: Heart size is normal. No pericardial effusion. Moderate coronary artery calcifications. Thoracic aorta is normal course and caliber, mild intimal thickening and calcific atherosclerosis. No central pulmonary embolism though not tailored for evaluation. MEDIASTINUM/NODES: No mediastinal mass. No lymphadenopathy by CT size criteria. LUNGS/PLEURA: Tracheobronchial tree is patent, no pneumothorax. Bronchial wall thickening. RIGHT middle lobe bulla. Mild centrilobular emphysema. Stable stellate scarring LEFT lower lobe. UPPER ABDOMEN: Nodular cirrhotic liver with partially imaged splenomegaly and moderate ascites with mesenteric densities most compatible with varicosities. Subcentimeter layering cholelithiasis with wall thickening seen with portal hypertension. MUSCULOSKELETAL: Nonacute. Osteopenia with multiple old mild thoracic compression fractures. IMPRESSION: 1. Bronchial wall thickening seen with bronchitis or reactive airway disease. No pneumonia. 2. Similar cirrhosis and evidence of portal hypertension including moderate ascites. Aortic Atherosclerosis (ICD10-I70.0) and Emphysema (ICD10-J43.9). Electronically Signed   By: Elon Alas M.D.   On: 07/08/2018 18:16   Dg Chest Portable 1 View  Result Date: 07/08/2018 CLINICAL DATA:  Hemoptysis EXAM: PORTABLE CHEST 1 VIEW COMPARISON:  June 27, 2018 FINDINGS: Lungs are mildly hyperexpanded without edema or consolidation. There are scattered areas of mild scarring. Heart size and pulmonary vascularity are normal. No adenopathy. No bone lesions. IMPRESSION: Lungs hyperexpanded with scattered areas of mild scarring. No edema or consolidation. Stable cardiac silhouette. No appreciable change from most recent study.  Electronically Signed   By: Lowella Grip III M.D.   On: 07/08/2018 17:45    Scheduled Meds: . albuterol  2.5 mg Nebulization Q6H  . furosemide  40 mg Intravenous Once  . furosemide  40 mg Oral Daily  . magnesium citrate  1 Bottle Oral Once  . mometasone-formoterol  2 puff Inhalation BID  . nicotine  14 mg Transdermal Daily  . [START ON 07/12/2018] pantoprazole  40 mg Intravenous Q12H  . polyethylene glycol  17 g Oral BID  . tiotropium  18 mcg Inhalation Daily   Continuous Infusions: . cefTRIAXone (ROCEPHIN)  IV 2 g (07/10/18 0627)  . octreotide  (SANDOSTATIN)    IV infusion 50 mcg/hr (07/10/18 0700)  . pantoprozole (PROTONIX) infusion 8 mg/hr (07/10/18 1059)    Assessment/Plan:  1. Esophageal variceal bleed.  Continue Protonix and octreotide drips.  Empiric Rocephin.  Endoscopy done yesterday with banding.  Serial hemoglobins.  Previous blood pressures little too low to start nadolol.  The last blood pressure was actually little bit better.  Would like to see a few more better blood pressures before I start nadolol. 2. Liver cirrhosis with ascites, hepatitis C, thrombocytopenia, elevated liver function test.  I ordered a paracentesis for tomorrow.  Already on empiric Rocephin. 3. COPD with wheeze.  Patient states he always wheezes.  Start on nebulizer treatments continue Advair and Spiriva.  Holding off on steroids at this point. 4. Chronic anxiety on Klonopin 5. Tobacco abuse.  Nicotine patch ordered 6. Left rib pain.  X-ray negative for fracture.  Code Status:     Code Status Orders  (From admission, onward)         Start     Ordered   07/08/18 2150  Full code  Continuous     07/08/18 2150        Code Status History    Date Active Date Inactive Code Status Order ID Comments User Context   01/13/2017 0632 01/14/2017 1829 Full Code [id number]  Saundra Shelling, MD Inpatient   01/03/2017 1746 01/05/2017 1547 Full Code [id number]  Gladstone Lighter, MD Inpatient   12/24/2016  1337 12/27/2016 1834 Full Code [id number]  Fritzi Mandes, MD Inpatient     Disposition Plan: To be determined based on clinical course  Consultants:  Gastroenterology  Procedures:  Upper endoscopy  Time spent: 27 minutes  Price

## 2018-07-10 NOTE — Progress Notes (Signed)
Texted Dr. Jodell Cipro regarding patient request for stronger pain medication.  Appropriate orders were placed.  Bradley Dunlap  07/10/2018  12:52 AM

## 2018-07-11 ENCOUNTER — Encounter: Payer: Self-pay | Admitting: Gastroenterology

## 2018-07-11 ENCOUNTER — Inpatient Hospital Stay: Payer: Medicaid Other

## 2018-07-11 DIAGNOSIS — R188 Other ascites: Secondary | ICD-10-CM

## 2018-07-11 DIAGNOSIS — K7460 Unspecified cirrhosis of liver: Secondary | ICD-10-CM

## 2018-07-11 LAB — BODY FLUID CELL COUNT WITH DIFFERENTIAL
Eos, Fluid: 0 %
Lymphs, Fluid: 22 %
Monocyte-Macrophage-Serous Fluid: 46 %
Neutrophil Count, Fluid: 32 %
Other Cells, Fluid: 0 %
Total Nucleated Cell Count, Fluid: 386 cu mm

## 2018-07-11 LAB — PROTEIN, PLEURAL OR PERITONEAL FLUID: Total protein, fluid: <3 g/dL

## 2018-07-11 LAB — GLUCOSE, PLEURAL OR PERITONEAL FLUID: Glucose, Fluid: 124 mg/dL

## 2018-07-11 LAB — VITAMIN B12: Vitamin B-12: 782 pg/mL (ref 180–914)

## 2018-07-11 LAB — PATHOLOGIST SMEAR REVIEW

## 2018-07-11 LAB — HEMOGLOBIN: Hemoglobin: 12 g/dL — ABNORMAL LOW (ref 13.0–17.0)

## 2018-07-11 MED ORDER — LACTULOSE 10 GM/15ML PO SOLN
30.0000 g | Freq: Two times a day (BID) | ORAL | Status: DC
  Administered 2018-07-11 – 2018-07-12 (×3): 30 g via ORAL
  Filled 2018-07-11 (×3): qty 60

## 2018-07-11 NOTE — Procedures (Signed)
PROCEDURE SUMMARY:  Successful US guided paracentesis from RLQ.  Yielded 2.3 L of clear yellow fluid.  No immediate complications.  Pt tolerated well.   Specimen was sent for labs.  EBL < 44mL  Ascencion Dike PA-C 07/11/2018 10:25 AM

## 2018-07-11 NOTE — Progress Notes (Signed)
Lucilla Lame, MD [facility]   [address] [place], [place] [address] Phone: [phone] Fax : [phone]   Subjective: The patient has had no sign of further GI bleeding.  The patient's hemoglobin is stable.  He reports that he is feeling drained from having his procedure this morning which was a paracentesis.  Cell count for the fluid showed only 386 white cells with only 32% of them being neutrophils.  The patient has a basic metabolic panel pending for today.  The patient's creatinine has remained stable at under 1.   Objective: Vital signs in last 24 hours: Vitals:   07/11/18 0337 07/11/18 0904 07/11/18 0935 07/11/18 1147  BP: 129/81 137/84 134/80 112/71  Pulse: (!) 101 99 98 88  Resp: 20   17  Temp: 97.9 F (36.6 C)   98.3 F (36.8 C)  TempSrc: Oral   Oral  SpO2: 90% (!) 87% (!) 88% 91%  Weight:      Height:       Weight change:   Intake/Output Summary (Last 24 hours) at 07/11/2018 1450 Last data filed at 07/11/2018 1300 Gross per 24 hour  Intake 588.74 ml  Output 850 ml  Net -261.26 ml     Exam: Heart:: Regular rate and rhythm, S1S2 present or without murmur or extra heart sounds Lungs: normal and clear to auscultation and percussion Abdomen: soft, nontender, normal bowel sounds   Lab Results: @LABTEST2 @ Micro Results: Recent Results (from the past 240 hour(s))  Body fluid culture     Status: None (Preliminary result)   Collection Time: 07/11/18  9:23 AM  Result Value Ref Range Status   Specimen Description   Final    PERITONEAL Performed at [facility], [address] [place], [place] [address]    Special Requests   Final    NONE Performed at [facility], [address] [place], [place] [address]    Gram Stain   Final    WBC PRESENT,BOTH PMN AND MONONUCLEAR NO ORGANISMS SEEN Performed at [facility], [place] [address], [place], [place] [address]    Culture PENDING  Incomplete   Report Status PENDING  Incomplete    Studies/Results: Dg Ribs Unilateral Left  Result Date: 07/09/2018 CLINICAL DATA:  Pain following cough EXAM: LEFT RIBS - 2 VIEW COMPARISON:  Chest radiograph and chest CT July 08, 2018 FINDINGS: Oblique and cone-down rib images were obtained. No rib fracture evident. Left lung clear. Cardiac silhouette within normal limits. No left-sided pneumothorax or pleural effusion. IMPRESSION: No demonstrable rib fracture.  Left lung clear. Electronically Signed   By: Lowella Grip III M.D.   On: 07/09/2018 16:20   US Paracentesis  Result Date: 07/11/2018 INDICATION: Ascites secondary to alcoholic cirrhosis. Request for diagnostic and therapeutic paracentesis. EXAM: ULTRASOUND GUIDED RIGHT LOWER QUADRANT PARACENTESIS MEDICATIONS: None. COMPLICATIONS: None immediate. PROCEDURE: Informed written consent was obtained from the patient after a discussion of the risks, benefits and alternatives to treatment. A timeout was performed prior to the initiation of the procedure. Initial ultrasound scanning demonstrates a large amount of ascites within the right lower abdominal quadrant. The right lower abdomen was prepped and draped in the usual sterile fashion. 1% lidocaine with epinephrine was used for local anesthesia. Following this, a 6 Fr Safe-T-Centesis catheter was introduced. An ultrasound image was saved for documentation purposes. The paracentesis was performed. The catheter was removed and a dressing was applied. The patient tolerated the procedure well without immediate post procedural complication. FINDINGS:  A total of approximately 2.3 L of clear yellow fluid was removed. Samples were sent to the laboratory as requested by the clinical team. IMPRESSION: Successful ultrasound-guided paracentesis yielding 2.3 liters of peritoneal fluid. Read by: Ascencion Dike PA-C Electronically Signed   By: Lucrezia Europe M.D.   On: 07/11/2018 10:27   Medications: I have reviewed the patient's current medications. Scheduled  Meds: . albuterol  2.5 mg Nebulization Q6H  . furosemide  40 mg Oral BID  . lactulose  30 g Oral BID  . mometasone-formoterol  2 puff Inhalation BID  . nicotine  14 mg Transdermal Daily  . [START ON 07/12/2018] pantoprazole  40 mg Intravenous Q12H  . polyethylene glycol  17 g Oral BID  . spironolactone  100 mg Oral Daily  . tiotropium  18 mcg Inhalation Daily   Continuous Infusions: . cefTRIAXone (ROCEPHIN)  IV 2 g (07/11/18 0617)  . octreotide  (SANDOSTATIN)    IV infusion 50 mcg/hr (07/11/18 0445)  . pantoprozole (PROTONIX) infusion 8 mg/hr (07/11/18 0839)   PRN Meds:.clonazePAM, HYDROmorphone (DILAUDID) injection, ondansetron **OR** ondansetron (ZOFRAN) IV, traMADol   Assessment: Active Problems:   Hematemesis    Plan: The patient has not had any further signs of bleeding.  He had esophageal banding and his hemoglobin is stable.  The patient had a paracentesis this morning that did not appear to show any signs of SBP.  His BMP is pending and if his creatinine is stable then he should have his diuretics increased.   LOS: 3 days   Lucilla Lame 07/11/2018, 2:50 PM

## 2018-07-11 NOTE — Progress Notes (Signed)
Patient ID: Bradley Dunlap, male   DOB: [birth year], 63 y.o.   MRN: [id number]   [facility] PROGRESS NOTE  Bradley Dunlap BPZ:[id number] DOB: [birth year] DOA: 07/08/2018 PCP: Ellene Route  HPI/Subjective: Patient having abdominal pain.  He has been having it for a while but pain a little bit better after paracentesis.  That left rib pain is better after paracentesis.  No further bleeding.  Objective: Vitals:   07/11/18 0935 07/11/18 1147  BP: 134/80 112/71  Pulse: 98 88  Resp:  17  Temp:  98.3 F (36.8 C)  SpO2: (!) 88% 91%    Filed Weights   07/08/18 1718  Weight: 103.4 kg    ROS: Review of Systems  Constitutional: Negative for chills and fever.  Eyes: Negative for blurred vision.  Respiratory: Negative for cough, shortness of breath and wheezing.   Cardiovascular: Negative for chest pain.  Gastrointestinal: Positive for abdominal pain. Negative for constipation, diarrhea, nausea and vomiting.  Genitourinary: Negative for dysuria.  Musculoskeletal: Negative for joint pain.  Neurological: Negative for dizziness and headaches.   Exam: Physical Exam  Constitutional: He is oriented to person, place, and time.  HENT:  Nose: No mucosal edema.  Mouth/Throat: No oropharyngeal exudate or posterior oropharyngeal edema.  Eyes: Pupils are equal, round, and reactive to light. Conjunctivae, EOM and lids are normal.  Neck: No JVD present. Carotid bruit is not present. No edema present. No thyroid mass and no thyromegaly present.  Cardiovascular: S1 normal and S2 normal. Exam reveals no gallop.  No murmur heard. Pulses:      Dorsalis pedis pulses are 2+ on the right side and 2+ on the left side.  Respiratory: No respiratory distress. He has decreased breath sounds in the right lower field and the left lower field. He has wheezes in the right lower field and the left lower field. He has no rhonchi. He has no rales.  GI: Soft. Bowel sounds are normal. He exhibits  distension. There is abdominal tenderness.  Musculoskeletal:     Right ankle: He exhibits no swelling.     Left ankle: He exhibits no swelling.  Lymphadenopathy:    He has no cervical adenopathy.  Neurological: He is alert and oriented to person, place, and time. No cranial nerve deficit.  Skin: Skin is warm. No rash noted. Nails show no clubbing.  Psychiatric: He has a normal mood and affect.      Data Reviewed: Basic Metabolic Panel: Recent Labs  Lab 07/08/18 1723 07/09/18 0755 07/10/18 0555  NA 132* 133* 132*  K 4.0 4.2 3.9  CL 101 101 103  CO2 24 25 24   GLUCOSE 129* 110* 123*  BUN 12 17 17   CREATININE 0.93 0.78 0.83  CALCIUM 8.6* 8.4* 8.5*   Liver Function Tests: Recent Labs  Lab 07/08/18 1723 07/09/18 0755 07/10/18 0555  AST 151* 129* 119*  ALT 108* 91* 85*  ALKPHOS 85 72 67  BILITOT 2.8* 2.8* 2.5*  PROT 7.2 6.7 6.6  ALBUMIN 3.0* 2.6* 2.6*   CBC: Recent Labs  Lab 07/08/18 1723 07/08/18 2336 07/09/18 0150 07/09/18 0755 07/10/18 0555 07/11/18 0506  WBC 8.1  --   --  6.2 7.1  --   NEUTROABS 5.0  --   --   --   --   --   HGB 13.3 12.3* 11.8* 11.7* 11.7* 12.0*  HCT 38.3* 36.0* 34.2* 33.5* 34.2*  --   MCV 95.3  --   --  96.5 97.7  --  PLT 93*  --   --  76* 78*  --      Studies: Dg Ribs Unilateral Left  Result Date: 07/09/2018 CLINICAL DATA:  Pain following cough EXAM: LEFT RIBS - 2 VIEW COMPARISON:  Chest radiograph and chest CT July 08, 2018 FINDINGS: Oblique and cone-down rib images were obtained. No rib fracture evident. Left lung clear. Cardiac silhouette within normal limits. No left-sided pneumothorax or pleural effusion. IMPRESSION: No demonstrable rib fracture.  Left lung clear. Electronically Signed   By: Lowella Grip III M.D.   On: 07/09/2018 16:20   US Paracentesis  Result Date: 07/11/2018 INDICATION: Ascites secondary to alcoholic cirrhosis. Request for diagnostic and therapeutic paracentesis. EXAM: ULTRASOUND GUIDED RIGHT LOWER  QUADRANT PARACENTESIS MEDICATIONS: None. COMPLICATIONS: None immediate. PROCEDURE: Informed written consent was obtained from the patient after a discussion of the risks, benefits and alternatives to treatment. A timeout was performed prior to the initiation of the procedure. Initial ultrasound scanning demonstrates a large amount of ascites within the right lower abdominal quadrant. The right lower abdomen was prepped and draped in the usual sterile fashion. 1% lidocaine with epinephrine was used for local anesthesia. Following this, a 6 Fr Safe-T-Centesis catheter was introduced. An ultrasound image was saved for documentation purposes. The paracentesis was performed. The catheter was removed and a dressing was applied. The patient tolerated the procedure well without immediate post procedural complication. FINDINGS: A total of approximately 2.3 L of clear yellow fluid was removed. Samples were sent to the laboratory as requested by the clinical team. IMPRESSION: Successful ultrasound-guided paracentesis yielding 2.3 liters of peritoneal fluid. Read by: Ascencion Dike PA-C Electronically Signed   By: Lucrezia Europe M.D.   On: 07/11/2018 10:27    Scheduled Meds: . albuterol  2.5 mg Nebulization Q6H  . furosemide  40 mg Oral BID  . lactulose  30 g Oral BID  . mometasone-formoterol  2 puff Inhalation BID  . nicotine  14 mg Transdermal Daily  . [START ON 07/12/2018] pantoprazole  40 mg Intravenous Q12H  . polyethylene glycol  17 g Oral BID  . spironolactone  100 mg Oral Daily  . tiotropium  18 mcg Inhalation Daily   Continuous Infusions: . cefTRIAXone (ROCEPHIN)  IV 2 g (07/11/18 0617)  . octreotide  (SANDOSTATIN)    IV infusion 50 mcg/hr (07/11/18 0445)  . pantoprozole (PROTONIX) infusion 8 mg/hr (07/11/18 0839)    Assessment/Plan:  1. Esophageal variceal bleed.  Continue Protonix and octreotide drips until this evening.  Empiric Rocephin.  Endoscopy done 2 days ago with banding.  Hemoglobin stable.   Previous blood pressures little too low to start nadolol and with the patient's wheezing I do not think nadolol would be a good medication. 2. Liver cirrhosis with ascites, hepatitis C, thrombocytopenia, elevated liver function test.  Already on empiric Rocephin.  Paracentesis of 2.3 L today white blood cell count elevated despite antibiotics being given for 3 days.  No organisms seen.  Likely will need prophylactic treatment as outpatient. 3. COPD with wheeze.  Patient states he always wheezes.  Continue on nebulizer treatments continue Advair and Spiriva.  Holding off on steroids at this point.  A few days of not smoking has helped him move air in the lungs with less wheeze. 4. Chronic anxiety on Klonopin 5. Tobacco abuse.  Nicotine patch ordered 6. Left rib pain.  X-ray negative for fracture.  Code Status:     Code Status Orders  (From admission, onward)  Start     Ordered   07/08/18 2150  Full code  Continuous     07/08/18 2150        Code Status History    Date Active Date Inactive Code Status Order ID Comments User Context   01/13/2017 0632 01/14/2017 1829 Full Code [id number]  Saundra Shelling, MD Inpatient   01/03/2017 1746 01/05/2017 1547 Full Code [id number]  Gladstone Lighter, MD Inpatient   12/24/2016 1337 12/27/2016 1834 Full Code [id number]  Fritzi Mandes, MD Inpatient     Disposition Plan: Hopefully able to go home tomorrow  Consultants:  Gastroenterology  Procedures:  Upper endoscopy  Time spent: 26 minutes  [place]

## 2018-07-12 ENCOUNTER — Inpatient Hospital Stay: Payer: Medicaid Other

## 2018-07-12 LAB — BASIC METABOLIC PANEL
Anion gap: 10 (ref 5–15)
BUN: 15 mg/dL (ref 8–23)
CO2: 26 mmol/L (ref 22–32)
Calcium: 8.8 mg/dL — ABNORMAL LOW (ref 8.9–10.3)
Chloride: 93 mmol/L — ABNORMAL LOW (ref 98–111)
Creatinine, Ser: 1.06 mg/dL (ref 0.61–1.24)
GFR calc Af Amer: >60 mL/min (ref >60)
Glucose, Bld: 128 mg/dL — ABNORMAL HIGH (ref 70–99)
Potassium: 3.8 mmol/L (ref 3.5–5.1)
Sodium: 129 mmol/L — ABNORMAL LOW (ref 135–145)

## 2018-07-12 LAB — PROTEIN, BODY FLUID (OTHER): Total Protein, Body Fluid Other: 1 g/dL

## 2018-07-12 MED ORDER — FLEET ENEMA 7-19 GM/118ML RE ENEM
1.0000 | ENEMA | Freq: Every day | RECTAL | Status: DC
  Administered 2018-07-12 – 2018-07-18 (×6): 1 via RECTAL

## 2018-07-12 MED ORDER — LACTULOSE 10 GM/15ML PO SOLN: 30.0000 g | Freq: Three times a day (TID) | ORAL | Status: DC

## 2018-07-12 MED ORDER — SODIUM CHLORIDE 0.9 % IV SOLN
INTRAVENOUS | Status: DC | PRN
  Administered 2018-07-12: 500 mL via INTRAVENOUS

## 2018-07-12 MED ORDER — SODIUM CHLORIDE 0.9 % IV SOLN
INTRAVENOUS | Status: DC
  Administered 2018-07-12: 17:00:00 via INTRAVENOUS

## 2018-07-12 MED ORDER — IOPAMIDOL (ISOVUE-300) INJECTION 61%
15.0000 mL | INTRAVENOUS | Status: AC
  Administered 2018-07-12 (×2): 15 mL via ORAL

## 2018-07-12 MED ORDER — IOHEXOL 300 MG/ML  SOLN
100.0000 mL | Freq: Once | INTRAMUSCULAR | Status: AC | PRN
  Administered 2018-07-12: 100 mL via INTRAVENOUS

## 2018-07-12 MED ORDER — BISACODYL 10 MG RE SUPP
10.0000 mg | Freq: Every day | RECTAL | Status: DC
  Administered 2018-07-12 – 2018-07-17 (×6): 10 mg via RECTAL
  Filled 2018-07-12 (×6): qty 1

## 2018-07-12 MED ORDER — TAMSULOSIN HCL 0.4 MG PO CAPS
0.4000 mg | ORAL_CAPSULE | Freq: Every day | ORAL | Status: DC
  Administered 2018-07-12 – 2018-07-15 (×3): 0.4 mg via ORAL
  Filled 2018-07-12 (×4): qty 1

## 2018-07-12 MED ORDER — ALUM & MAG HYDROXIDE-SIMETH 200-200-20 MG/5ML PO SUSP
30.0000 mL | Freq: Four times a day (QID) | ORAL | Status: DC | PRN
  Administered 2018-07-12: 30 mL via ORAL
  Filled 2018-07-12: qty 30

## 2018-07-12 NOTE — Progress Notes (Signed)
RN notified MD of CT scan results. Per MD pt will stay NPO and okay to NS going at 50 ml/hr,

## 2018-07-12 NOTE — Progress Notes (Signed)
Patient ID: Bradley Dunlap, male   DOB: Sep 21, 1955, 63 y.o.   MRN: [id number]   [facility] PROGRESS NOTE  BENSYN BORNEMANN ZGY:[id number] DOB: 14-Jun-1955 DOA: 07/08/2018 PCP: Ellene Route  HPI/Subjective: Patient not feeling well.  Having abdominal pain.  Has not had a bowel movement.  Some nausea vomiting.  Objective: Vitals:   07/12/18 0858 07/12/18 1156  BP:  117/83  Pulse:  (!) 104  Resp:    Temp:  98.2 F (36.8 C)  SpO2: 93% 92%    Filed Weights   07/08/18 1718  Weight: 103.4 kg    ROS: Review of Systems  Constitutional: Negative for chills and fever.  Eyes: Negative for blurred vision.  Respiratory: Negative for cough, shortness of breath and wheezing.   Cardiovascular: Negative for chest pain.  Gastrointestinal: Positive for abdominal pain, constipation, nausea and vomiting. Negative for diarrhea.  Genitourinary: Negative for dysuria.  Musculoskeletal: Negative for joint pain.  Neurological: Negative for dizziness and headaches.   Exam: Physical Exam  Constitutional: He is oriented to person, place, and time.  HENT:  Nose: No mucosal edema.  Mouth/Throat: No oropharyngeal exudate or posterior oropharyngeal edema.  Eyes: Pupils are equal, round, and reactive to light. Conjunctivae, EOM and lids are normal.  Neck: No JVD present. Carotid bruit is not present. No edema present. No thyroid mass and no thyromegaly present.  Cardiovascular: S1 normal and S2 normal. Exam reveals no gallop.  No murmur heard. Pulses:      Dorsalis pedis pulses are 2+ on the right side and 2+ on the left side.  Respiratory: No respiratory distress. He has decreased breath sounds in the right lower field and the left lower field. He has wheezes in the right lower field and the left lower field. He has no rhonchi. He has no rales.  GI: Soft. Bowel sounds are normal. He exhibits distension. There is abdominal tenderness.  Musculoskeletal:     Right ankle: He exhibits no  swelling.     Left ankle: He exhibits no swelling.  Lymphadenopathy:    He has no cervical adenopathy.  Neurological: He is alert and oriented to person, place, and time. No cranial nerve deficit.  Skin: Skin is warm. No rash noted. Nails show no clubbing.  Psychiatric: He has a normal mood and affect.      Data Reviewed: Basic Metabolic Panel: Recent Labs  Lab 07/08/18 1723 07/09/18 0755 07/10/18 0555 07/12/18 0322  NA 132* 133* 132* 129*  K 4.0 4.2 3.9 3.8  CL 101 101 103 93*  CO2 24 25 24 26   GLUCOSE 129* 110* 123* 128*  BUN 12 17 17 15   CREATININE 0.93 0.78 0.83 1.06  CALCIUM 8.6* 8.4* 8.5* 8.8*   Liver Function Tests: Recent Labs  Lab 07/08/18 1723 07/09/18 0755 07/10/18 0555  AST 151* 129* 119*  ALT 108* 91* 85*  ALKPHOS 85 72 67  BILITOT 2.8* 2.8* 2.5*  PROT 7.2 6.7 6.6  ALBUMIN 3.0* 2.6* 2.6*   CBC: Recent Labs  Lab 07/08/18 1723 07/08/18 2336 07/09/18 0150 07/09/18 0755 07/10/18 0555 07/11/18 0506  WBC 8.1  --   --  6.2 7.1  --   NEUTROABS 5.0  --   --   --   --   --   HGB 13.3 12.3* 11.8* 11.7* 11.7* 12.0*  HCT 38.3* 36.0* 34.2* 33.5* 34.2*  --   MCV 95.3  --   --  96.5 97.7  --   PLT 93*  --   --  76* 79*  --      Studies: US Paracentesis  Result Date: 07/11/2018 INDICATION: Ascites secondary to alcoholic cirrhosis. Request for diagnostic and therapeutic paracentesis. EXAM: ULTRASOUND GUIDED RIGHT LOWER QUADRANT PARACENTESIS MEDICATIONS: None. COMPLICATIONS: None immediate. PROCEDURE: Informed written consent was obtained from the patient after a discussion of the risks, benefits and alternatives to treatment. A timeout was performed prior to the initiation of the procedure. Initial ultrasound scanning demonstrates a large amount of ascites within the right lower abdominal quadrant. The right lower abdomen was prepped and draped in the usual sterile fashion. 1% lidocaine with epinephrine was used for local anesthesia. Following this, a 6 Fr  Safe-T-Centesis catheter was introduced. An ultrasound image was saved for documentation purposes. The paracentesis was performed. The catheter was removed and a dressing was applied. The patient tolerated the procedure well without immediate post procedural complication. FINDINGS: A total of approximately 2.3 L of clear yellow fluid was removed. Samples were sent to the laboratory as requested by the clinical team. IMPRESSION: Successful ultrasound-guided paracentesis yielding 2.3 liters of peritoneal fluid. Read by: Ascencion Dike PA-C Electronically Signed   By: Lucrezia Europe M.D.   On: 07/11/2018 10:27   Dg Abd Decub  Result Date: 07/12/2018 CLINICAL DATA:  Abdominal pain and distension. EXAM: ABDOMEN - 1 VIEW DECUBITUS COMPARISON:  CT abdomen pelvis dated May 10, 2018. FINDINGS: There are few mildly dilated loops of small bowel with air-fluid levels. Air-fluid levels within nondilated colon. No pneumoperitoneum. IMPRESSION: 1. Several mildly dilated loops of small bowel with air-fluid levels. Non-dilated colon with air-fluid levels. Findings are favored to reflect ileus given both large and small bowel involvement. Correlate for obstruction and consider CT as clinically indicated. Electronically Signed   By: Titus Dubin M.D.   On: 07/12/2018 12:23    Scheduled Meds: . albuterol  2.5 mg Nebulization Q6H  . bisacodyl  10 mg Rectal QHS  . furosemide  40 mg Oral BID  . mometasone-formoterol  2 puff Inhalation BID  . nicotine  14 mg Transdermal Daily  . pantoprazole  40 mg Intravenous Q12H  . sodium phosphate  1 enema Rectal Daily  . spironolactone  100 mg Oral Daily  . tamsulosin  0.4 mg Oral QPC breakfast  . tiotropium  18 mcg Inhalation Daily   Continuous Infusions: . sodium chloride 500 mL (07/12/18 0738)  . cefTRIAXone (ROCEPHIN)  IV 2 g (07/12/18 0741)    Assessment/Plan:  1. Abdominal pain.  Abdominal flat and upright shows dilated bowel.  Will get CT scan to rule out  obstruction. Make n.p.o.  Hold off on NG tube with recent esophageal banding.  Fleet enema and Dulcolax suppository.  Will hold off on oral constipation meds until CAT scan back.  Make n.p.o. except for meds.   2. Esophageal variceal bleed.  Continue Protonix.  Octreotide stopped today.  Have esophageal banding couple days ago. 3. Liver cirrhosis with ascites, hepatitis C, thrombocytopenia, elevated liver function test.  Already on empiric Rocephin.  Paracentesis with 2.3 L removed yesterday.  White blood cell count elevated despite antibiotics being given for 3 days.  No organisms seen.  Likely will need prophylactic treatment as outpatient. 4. COPD with wheeze.  Patient states he always wheezes.  Continue on nebulizer treatments continue Advair and Spiriva.  Holding off on steroids at this point.  A few days of not smoking has helped him move air in the lungs with less wheeze. 5. Chronic anxiety on Klonopin 6. Tobacco abuse.  Nicotine patch ordered 7. Left rib pain.  X-ray negative for fracture.  Code Status:     Code Status Orders  (From admission, onward)         Start     Ordered   07/08/18 2150  Full code  Continuous     07/08/18 2150        Code Status History    Date Active Date Inactive Code Status Order ID Comments User Context   01/13/2017 0632 01/14/2017 1829 Full Code [id number]  Saundra Shelling, MD Inpatient   01/03/2017 1746 01/05/2017 1547 Full Code [id number]  Gladstone Lighter, MD Inpatient   12/24/2016 1337 12/27/2016 1834 Full Code [id number]  Fritzi Mandes, MD Inpatient     Disposition Plan: Need to evaluate abdominal pain.  Consultants:  Gastroenterology  Procedures:  Upper endoscopy  Time spent: 27 minutes  [place]

## 2018-07-12 NOTE — Progress Notes (Signed)
Lucilla Lame, MD [facility]   [address], [place], [place] [address] Phone: [phone] Fax : [phone]   Subjective: The patient had a paracentesis yesterday and now is having some abdominal pain.  The patient had a KUB that showed dilation of the small bowel.  The patient is set up to have a CT scan of the abdomen today.  There is no sign of any further GI bleeding.   Objective: Vital signs in last 24 hours: Vitals:   07/12/18 0526 07/12/18 0858 07/12/18 1156 07/12/18 1405  BP: 106/75  117/83   Pulse: (!) 106  (!) 104   Resp: 20     Temp: (!) 97.5 F (36.4 C)  98.2 F (36.8 C)   TempSrc: Oral  Oral   SpO2: 93% 93% 92% (!) 86%  Weight:      Height:       Weight change:   Intake/Output Summary (Last 24 hours) at 07/12/2018 1436 Last data filed at 07/12/2018 0900 Gross per 24 hour  Intake 0 ml  Output 1000 ml  Net -1000 ml     Exam: Heart:: Regular rate and rhythm, S1S2 present or without murmur or extra heart sounds Lungs: Decreased breath sounds bilaterally Abdomen: Distended with diffuse abdominal tenderness   Lab Results: @LABTEST2 @ Micro Results: Recent Results (from the past 240 hour(s))  Body fluid culture     Status: None (Preliminary result)   Collection Time: 07/11/18  9:23 AM  Result Value Ref Range Status   Specimen Description   Final    PERITONEAL Performed at [facility], [address] [place], [place] [address]    Special Requests   Final    NONE Performed at [facility], [address] [place], [place] [address]    Gram Stain   Final    WBC PRESENT,BOTH PMN AND MONONUCLEAR NO ORGANISMS SEEN    Culture   Final    NO GROWTH < 24 HOURS Performed at [facility], [address] [place], [place] [address]    Report Status PENDING  Incomplete   Studies/Results: US Paracentesis  Result Date: 07/11/2018 INDICATION: Ascites secondary to alcoholic cirrhosis. Request for diagnostic and therapeutic  paracentesis. EXAM: ULTRASOUND GUIDED RIGHT LOWER QUADRANT PARACENTESIS MEDICATIONS: None. COMPLICATIONS: None immediate. PROCEDURE: Informed written consent was obtained from the patient after a discussion of the risks, benefits and alternatives to treatment. A timeout was performed prior to the initiation of the procedure. Initial ultrasound scanning demonstrates a large amount of ascites within the right lower abdominal quadrant. The right lower abdomen was prepped and draped in the usual sterile fashion. 1% lidocaine with epinephrine was used for local anesthesia. Following this, a 6 Fr Safe-T-Centesis catheter was introduced. An ultrasound image was saved for documentation purposes. The paracentesis was performed. The catheter was removed and a dressing was applied. The patient tolerated the procedure well without immediate post procedural complication. FINDINGS: A total of approximately 2.3 L of clear yellow fluid was removed. Samples were sent to the laboratory as requested by the clinical team. IMPRESSION: Successful ultrasound-guided paracentesis yielding 2.3 liters of peritoneal fluid. Read by: Ascencion Dike PA-C Electronically Signed   By: Lucrezia Europe M.D.   On: 07/11/2018 10:27   Dg Abd Decub  Result Date: 07/12/2018 CLINICAL DATA:  Abdominal pain and distension. EXAM: ABDOMEN - 1 VIEW DECUBITUS COMPARISON:  CT abdomen pelvis dated May 10, 2018. FINDINGS: There are few mildly dilated loops of small bowel with air-fluid levels.  Air-fluid levels within nondilated colon. No pneumoperitoneum. IMPRESSION: 1. Several mildly dilated loops of small bowel with air-fluid levels. Non-dilated colon with air-fluid levels. Findings are favored to reflect ileus given both large and small bowel involvement. Correlate for obstruction and consider CT as clinically indicated. Electronically Signed   By: Titus Dubin M.D.   On: 07/12/2018 12:23   Medications: I have reviewed the patient's current medications.  Scheduled Meds: . albuterol  2.5 mg Nebulization Q6H  . bisacodyl  10 mg Rectal QHS  . furosemide  40 mg Oral BID  . iopamidol  15 mL Oral Q1 Hr x 2  . mometasone-formoterol  2 puff Inhalation BID  . nicotine  14 mg Transdermal Daily  . pantoprazole  40 mg Intravenous Q12H  . sodium phosphate  1 enema Rectal Daily  . spironolactone  100 mg Oral Daily  . tamsulosin  0.4 mg Oral QPC breakfast  . tiotropium  18 mcg Inhalation Daily   Continuous Infusions: . sodium chloride 500 mL (07/12/18 0738)  . cefTRIAXone (ROCEPHIN)  IV 2 g (07/12/18 0741)   PRN Meds:.sodium chloride, clonazePAM, HYDROmorphone (DILAUDID) injection, ondansetron **OR** ondansetron (ZOFRAN) IV, traMADol   Assessment: Active Problems:   Hematemesis   Cirrhosis of liver with ascites ([place])    Plan: This patient has cirrhosis of the liver with ascites and GI bleeding treated with esophageal band ligation.  The patient has abdominal pain after having a paracentesis yesterday.  The fluid did not show any sign of infection.  The patient will be getting a CT scan today to rule out any intra-abdominal pathology as the cause of his symptoms.   LOS: 4 days   Lucilla Lame 07/12/2018, 2:36 PM

## 2018-07-12 NOTE — Progress Notes (Signed)
Pt did not have 02 in his nose upon walking into the room. He said he didn't know who took it off. His Sa02 were 86%. After the nebulizer treatment, the 02 was placed back on and turned to 2 L.

## 2018-07-13 ENCOUNTER — Inpatient Hospital Stay: Payer: Medicaid Other

## 2018-07-13 DIAGNOSIS — Z7189 Other specified counseling: Secondary | ICD-10-CM

## 2018-07-13 DIAGNOSIS — K56609 Unspecified intestinal obstruction, unspecified as to partial versus complete obstruction: Secondary | ICD-10-CM

## 2018-07-13 DIAGNOSIS — Z515 Encounter for palliative care: Secondary | ICD-10-CM

## 2018-07-13 LAB — BASIC METABOLIC PANEL
Anion gap: 8 (ref 5–15)
BUN: 20 mg/dL (ref 8–23)
CO2: 27 mmol/L (ref 22–32)
Calcium: 8.9 mg/dL (ref 8.9–10.3)
Chloride: 94 mmol/L — ABNORMAL LOW (ref 98–111)
Creatinine, Ser: 1.44 mg/dL — ABNORMAL HIGH (ref 0.61–1.24)
GFR calc Af Amer: 60 mL/min — ABNORMAL LOW (ref >60)
GFR, EST NON AFRICAN AMERICAN: 52 mL/min — AB (ref >60)
Glucose, Bld: 121 mg/dL — ABNORMAL HIGH (ref 70–99)
Potassium: 4 mmol/L (ref 3.5–5.1)
Sodium: 129 mmol/L — ABNORMAL LOW (ref 135–145)

## 2018-07-13 LAB — HEPATIC FUNCTION PANEL
ALT: 64 U/L — ABNORMAL HIGH (ref 0–44)
AST: 88 U/L — ABNORMAL HIGH (ref 15–41)
Albumin: 2.7 g/dL — ABNORMAL LOW (ref 3.5–5.0)
Alkaline Phosphatase: 58 U/L (ref 38–126)
Bilirubin, Direct: 1.5 mg/dL — ABNORMAL HIGH (ref 0.0–0.2)
Indirect Bilirubin: 2.3 mg/dL — ABNORMAL HIGH (ref 0.3–0.9)
Total Bilirubin: 3.8 mg/dL — ABNORMAL HIGH (ref 0.3–1.2)
Total Protein: 6.7 g/dL (ref 6.5–8.1)

## 2018-07-13 LAB — CBC
HCT: 31.8 % — ABNORMAL LOW (ref 39.0–52.0)
Hemoglobin: 11.1 g/dL — ABNORMAL LOW (ref 13.0–17.0)
MCH: 33.7 pg (ref 26.0–34.0)
MCHC: 34.9 g/dL (ref 30.0–36.0)
MCV: 96.7 fL (ref 80.0–100.0)
PLATELETS: 78 10*3/uL — AB (ref 150–400)
RBC: 3.29 MIL/uL — ABNORMAL LOW (ref 4.22–5.81)
RDW: 16.4 % — ABNORMAL HIGH (ref 11.5–15.5)
WBC: 11.8 10*3/uL — AB (ref 4.0–10.5)

## 2018-07-13 LAB — PROTIME-INR
INR: 1.6 — AB (ref 0.8–1.2)
Prothrombin Time: 18.5 seconds — ABNORMAL HIGH (ref 11.4–15.2)

## 2018-07-13 MED ORDER — DEXTROSE-NACL 5-0.9 % IV SOLN
INTRAVENOUS | Status: DC
  Administered 2018-07-13 – 2018-07-15 (×4): via INTRAVENOUS

## 2018-07-13 MED ORDER — HYDROMORPHONE HCL 1 MG/ML IJ SOLN
INTRAMUSCULAR | Status: AC
  Filled 2018-07-13: qty 1

## 2018-07-13 MED ORDER — PHENOL 1.4 % MT LIQD
1.0000 | OROMUCOSAL | Status: DC | PRN
  Filled 2018-07-13: qty 177

## 2018-07-13 MED ORDER — HYDROMORPHONE HCL 1 MG/ML IJ SOLN: 0.5000 mg | INTRAMUSCULAR | Status: DC | PRN

## 2018-07-13 MED ORDER — PHENOL 1.4 % MT LIQD
2.0000 | OROMUCOSAL | Status: DC | PRN
  Administered 2018-07-13: 2 via OROMUCOSAL
  Filled 2018-07-13: qty 177

## 2018-07-13 MED ORDER — DEXTROSE-NACL 5-0.45 % IV SOLN: INTRAVENOUS | Status: DC

## 2018-07-13 MED ORDER — HYDROMORPHONE HCL 1 MG/ML IJ SOLN
0.5000 mg | INTRAMUSCULAR | Status: DC | PRN
  Administered 2018-07-13 – 2018-07-18 (×29): 0.5 mg via INTRAVENOUS
  Filled 2018-07-13 (×29): qty 1

## 2018-07-13 NOTE — Progress Notes (Signed)
Lucilla Lame, MD [facility]   [address], [place], [place] [address] Phone: [phone] Fax : [phone]   Subjective: The patient had an NG tube placed today for what appears to be small bowel obstruction with a transition point in the distal ileum above the ileocecal valve.  The patient has a high meld score and appears not to be a surgical candidate.  He has had close to a liter of fluid removed via the NG tube today.   Objective: Vital signs in last 24 hours: Vitals:   07/13/18 0230 07/13/18 0623 07/13/18 0624 07/13/18 1150  BP:  111/76  104/60  Pulse:  (!) 103 100 99  Resp:  (!) 24  17  Temp:  98 F (36.7 C)  98.4 F (36.9 C)  TempSrc:  Oral  Oral  SpO2: 90% (!) 86% 91% 91%  Weight:      Height:       Weight change:   Intake/Output Summary (Last 24 hours) at 07/13/2018 1405 Last data filed at 07/13/2018 1145 Gross per 24 hour  Intake 238.42 ml  Output 1450 ml  Net -1211.58 ml     Exam: Heart:: Regular rate and rhythm, S1S2 present or without murmur or extra heart sounds Lungs: normal and clear to auscultation and percussion Abdomen: Tympanic abdomen which is distended and decreased bowel sounds.   Lab Results: @LABTEST2 @ Micro Results: Recent Results (from the past 240 hour(s))  Body fluid culture     Status: None (Preliminary result)   Collection Time: 07/11/18  9:23 AM  Result Value Ref Range Status   Specimen Description   Final    PERITONEAL Performed at [facility], [address], [place], [place] [address]    Special Requests   Final    NONE Performed at [facility], [address] [place], [place] [address]    Gram Stain   Final    WBC PRESENT,BOTH PMN AND MONONUCLEAR NO ORGANISMS SEEN    Culture   Final    NO GROWTH 2 DAYS Performed at [facility], [place] [address], [place], [place] [address]    Report Status PENDING  Incomplete   Studies/Results: Dg Abd 1 View  Result Date: 07/13/2018 CLINICAL  DATA:  Nasogastric tube EXAM: ABDOMEN - 1 VIEW COMPARISON:  Abdominal CT from yesterday FINDINGS: Nasogastric tube with tip over the stomach and side-port over the GE junction. Dilated small bowel over the upper abdomen. Lung bases are clear. IMPRESSION: 1. Nasogastric tube tip is at the stomach. The side port is over the lower esophagus. 2. Ongoing bowel obstruction. Electronically Signed   By: Monte Fantasia M.D.   On: 07/13/2018 11:32   Ct Abdomen Pelvis W Contrast  Result Date: 07/12/2018 CLINICAL DATA:  63 year old male with a history abdominal pain with nausea and vomiting EXAM: CT ABDOMEN AND PELVIS WITH CONTRAST TECHNIQUE: Multidetector CT imaging of the abdomen and pelvis was performed using the standard protocol following bolus administration of intravenous contrast. CONTRAST:  128mL OMNIPAQUE IOHEXOL 300 MG/ML  SOLN COMPARISON:  05/10/2018, CT chest 07/08/2018 FINDINGS: Lower chest: Atelectatic changes/scarring at the lung bases. Hepatobiliary: Cirrhotic changes of the liver with right liver lobe hypertrophy, nodular contour, caudate lobe hypertrophy. Redemonstration of low-density lesion on the lateral right liver, compatible with prior treatment history. Calcified cholelithiasis within the gallbladder. High density material compatible with vicarious excretion of contrast. Pancreas: Unremarkable Spleen: Unremarkable spleen Adrenals/Urinary Tract: Unremarkable adrenal glands. Right kidney without hydronephrosis or nephrolithiasis. Unremarkable course  of the right ureter. Ectopic left kidney again noted, overlying the proximal left common iliac artery near the bifurcation of the aorta. Unremarkable course of the left ureter. No hydronephrosis. Stomach/Bowel: Hiatal hernia. Distention of the stomach. Significant distension of proximal mid and distal small bowel. There is flocculent material within the distal small bowel loops, compatible with stasis. The transition is within the distal ileum, just  above the ileocecal junction. Colon is relatively decompressed with moderate stool burden. No focal inflammatory changes of colon. Vascular/Lymphatic: Atherosclerotic changes of the abdominal aorta. Mesenteric arteries and renal arteries are patent. Bilateral iliac arteries patent. Proximal SFA and profunda femoris patent. No lymphadenopathy. Small lymph nodes in the para-aortic/preaortic nodal stations. Low volume of low-density ascites, predominantly right upper quadrant. Reproductive: Unremarkable pelvic structures Other: Fluid within paraumbilical hernia, unchanged from the comparison. Musculoskeletal: Degenerative changes of the spine. Surgical changes of the posterior elements of the lumbar spine. Degenerative disc disease. No fracture. Degenerative changes of the hips. IMPRESSION: Small-bowel obstruction with the transition point in the distal ileum above the ileocecal valve. Associated distension through the length of small bowel as well as stomach. Cirrhosis with sequela of portal hypertension including small volume ascites and splenomegaly. Aortic Atherosclerosis (ICD10-I70.0). Cholelithiasis. Additional ancillary findings as above. Electronically Signed   By: Corrie Mckusick D.O.   On: 07/12/2018 16:44   Dg Abd Decub  Result Date: 07/12/2018 CLINICAL DATA:  Abdominal pain and distension. EXAM: ABDOMEN - 1 VIEW DECUBITUS COMPARISON:  CT abdomen pelvis dated May 10, 2018. FINDINGS: There are few mildly dilated loops of small bowel with air-fluid levels. Air-fluid levels within nondilated colon. No pneumoperitoneum. IMPRESSION: 1. Several mildly dilated loops of small bowel with air-fluid levels. Non-dilated colon with air-fluid levels. Findings are favored to reflect ileus given both large and small bowel involvement. Correlate for obstruction and consider CT as clinically indicated. Electronically Signed   By: Titus Dubin M.D.   On: 07/12/2018 12:23   Medications: I have reviewed the patient's  current medications. Scheduled Meds:  albuterol  2.5 mg Nebulization Q6H   bisacodyl  10 mg Rectal QHS   furosemide  40 mg Oral BID   HYDROmorphone       HYDROmorphone       mometasone-formoterol  2 puff Inhalation BID   nicotine  14 mg Transdermal Daily   pantoprazole  40 mg Intravenous Q12H   sodium phosphate  1 enema Rectal Daily   spironolactone  100 mg Oral Daily   tamsulosin  0.4 mg Oral QPC breakfast   tiotropium  18 mcg Inhalation Daily   Continuous Infusions:  sodium chloride Stopped (07/12/18 0813)   cefTRIAXone (ROCEPHIN)  IV 2 g (07/13/18 0737)   dextrose 5 % and 0.9% NaCl 100 mL/hr at 07/13/18 1212   PRN Meds:.sodium chloride, alum & mag hydroxide-simeth, clonazePAM, HYDROmorphone (DILAUDID) injection, ondansetron **OR** ondansetron (ZOFRAN) IV, phenol, traMADol   Assessment: Active Problems:   Hematemesis   Cirrhosis of liver with ascites ([place])    Plan: This patient has chronic liver disease with ascites hepatitis C and a high meld score.  The patient has a history of esophageal varices with banding and had an NG tube placed for decompression due to a transition point in the ileum and small bowel obstruction.  I would recommend further supportive care at this point and palliative care consultation due to the patient's poor prognosis.   LOS: 5 days   Genoveva Singleton 07/13/2018, 2:05 PM

## 2018-07-13 NOTE — Progress Notes (Signed)
Per GI its okay to insert NG tube for decompression.

## 2018-07-13 NOTE — Progress Notes (Signed)
PT Cancellation Note  Patient Details Name: Bradley Dunlap MRN: [id number] DOB: 03-23-1956   Cancelled Treatment:    Reason Eval/Treat Not Completed: Patient declined, no reason specified- will re-attempt later if time allows.    Federica Allport 07/13/2018, 11:43 AM

## 2018-07-13 NOTE — Consult Note (Addendum)
Consultation Note Date: 07/13/2018   Patient Name: Bradley Dunlap  DOB: 02-16-1956  MRN: [id number]  Age / Sex: 63 y.o., male  PCP: Ellene Route Referring Physician: Gorden Harms, MD  Reason for Consultation: Establishing goals of care  HPI/Patient Profile: Bradley Dunlap  is a 63 y.o. male with a known history of alcohol abuse, hepatitis C, liver cancer, COPD liver cirrhosis with ascites status post paracentesis few months ago is presenting to the ED with a chief complaint of vomiting and coughing of bright red blood.  Clinical Assessment and Goals of Care: Mr. Casebeer lives alone. He states he was never married and does not have children. He does not really leave his home because he has pain with walking. He was disabled by an injury many years ago. He states he does not eat or drink well. He is supposed to wear O2, but states he does not because he would blow his house up due to his smoking.    We discussed his diagnosis, prognosis, GOC, EOL wishes disposition and options.  A detailed discussion was had today regarding advanced directives.  Concepts specific to code status, artifical feeding and hydration, IV antibiotics and rehospitalization were discussed.  The difference between an aggressive medical intervention path and a comfort care path was discussed.  Values and goals of care important to patient and family were attempted to be elicited.  Discussed limitations of medical interventions to prolong quality of life for this patient at this time in this situation and discussed the concept of human mortality.  He states he has been thinking about these things for a while, but has not came to any decisions about how to move forward, and wants to continue with all treatment possible. He would like to speak with a friend.   Will continue to follow.     SUMMARY OF RECOMMENDATIONS   Will continue  to follow. He wants to continue full scope tx at this time.    Code Status/Advance Care Planning:  Full code    Prognosis:   Poor overall       Primary Diagnoses: Present on Admission: . Hematemesis   I have reviewed the medical record, interviewed the patient and family, and examined the patient. The following aspects are pertinent.  Past Medical History:  Diagnosis Date  . Alcohol abuse   . Alcohol use   . Anemia   . Anxiety   . Arthritis   . Ascites   . Cancer [facility])    liver cancer  . Chronic hepatitis C ([place])   . COPD (chronic obstructive pulmonary disease) ([place])   . Esophageal varices ([place])   . GERD (gastroesophageal reflux disease)   . Hepatitis C   . Hypertension   . Illiterate    cannot read or write  . Liver cirrhosis ([place])   . Peripheral vascular disease ([place])   . Tobacco use    Social History   Socioeconomic History  . Marital status: Single    Spouse name: Not on file  .  Number of children: Not on file  . Years of education: Not on file  . Highest education level: Not on file  Occupational History  . Occupation: disabled  Social Needs  . Financial resource strain: Not on file  . Food insecurity:    Worry: Not on file    Inability: Not on file  . Transportation needs:    Medical: Not on file    Non-medical: Not on file  Tobacco Use  . Smoking status: Current Every Day Smoker    Packs/day: 0.50    Types: Cigarettes  . Smokeless tobacco: Former Network engineer and Sexual Activity  . Alcohol use: No    Comment: 2-3 months  . Drug use: No  . Sexual activity: Not Currently    Birth control/protection: Abstinence  Lifestyle  . Physical activity:    Days per week: Not on file    Minutes per session: Not on file  . Stress: Not on file  Relationships  . Social connections:    Talks on phone: Not on file    Gets together: Not on file    Attends religious service: Not on file    Active member of club or organization: Not on file     Attends meetings of clubs or organizations: Not on file    Relationship status: Not on file  Other Topics Concern  . Not on file  Social History Narrative   Lives at home by himself. Independent at baseline   Family History  Problem Relation Age of Onset  . Cancer Mother   . Heart attack Father    Scheduled Meds: . albuterol  2.5 mg Nebulization Q6H  . bisacodyl  10 mg Rectal QHS  . furosemide  40 mg Oral BID  . HYDROmorphone      . HYDROmorphone      . mometasone-formoterol  2 puff Inhalation BID  . nicotine  14 mg Transdermal Daily  . pantoprazole  40 mg Intravenous Q12H  . sodium phosphate  1 enema Rectal Daily  . spironolactone  100 mg Oral Daily  . tamsulosin  0.4 mg Oral QPC breakfast  . tiotropium  18 mcg Inhalation Daily   Continuous Infusions: . sodium chloride Stopped (07/12/18 0813)  . cefTRIAXone (ROCEPHIN)  IV 2 g (07/13/18 0737)  . dextrose 5 % and 0.9% NaCl 100 mL/hr at 07/13/18 1212   PRN Meds:.sodium chloride, alum & mag hydroxide-simeth, clonazePAM, HYDROmorphone (DILAUDID) injection, ondansetron **OR** ondansetron (ZOFRAN) IV, phenol, traMADol Medications Prior to Admission:  Prior to Admission medications   Medication Sig Start Date End Date Taking? Authorizing Provider  albuterol (PROVENTIL HFA;VENTOLIN HFA) 108 (90 Base) MCG/ACT inhaler Inhale 2 puffs into the lungs every 4 (four) hours as needed for wheezing. 01/14/17  Yes Leslye Peer, Richard, MD  albuterol (PROVENTIL) (2.5 MG/3ML) 0.083% nebulizer solution Take 3 mLs (2.5 mg total) by nebulization every 4 (four) hours as needed for wheezing. 12/27/16  Yes Wieting, Richard, MD  clonazePAM (KLONOPIN) 0.5 MG tablet Take 1 tablet (0.5 mg total) by mouth 2 (two) times daily as needed for anxiety. 01/14/17  Yes Wieting, Richard, MD  esomeprazole (NEXIUM) 40 MG capsule Take 40 mg by mouth daily. 11/26/17  Yes [provider]  Fluticasone-Salmeterol (ADVAIR DISKUS) 250-50 MCG/DOSE AEPB Inhale 1 puff into the  lungs 2 (two) times daily. 01/14/17  Yes Wieting, Richard, MD  furosemide (LASIX) 40 MG tablet Take 1 tablet (40 mg total) by mouth daily. Patient taking differently: Take 20 mg by mouth  daily.  01/05/17 07/08/18 Yes Demetrios Loll, MD  propranolol (INDERAL) 10 MG tablet Take 10 mg by mouth 2 (two) times daily. 11/04/17  Yes [provider]  traMADol (ULTRAM) 50 MG tablet Take 1 tablet (50 mg total) by mouth every 6 (six) hours as needed for moderate pain. 01/14/17  Yes Wieting, Richard, MD  tiotropium (SPIRIVA HANDIHALER) 18 MCG inhalation capsule Place 1 capsule (18 mcg total) into inhaler and inhale daily. 01/14/17 01/21/18  Loletha Grayer, MD   Allergies  Allergen Reactions  . Codeine Itching and Other (See Comments)    Other reaction(s): Other (See Comments) Back bone itching    . Indomethacin Itching    Other reaction(s): ITCHING   Review of Systems  Gastrointestinal: Positive for abdominal distention and abdominal pain.    Physical Exam Pulmonary:     Effort: Pulmonary effort is normal.  Neurological:     Mental Status: He is alert.     Vital Signs: BP 104/60 (BP Location: Right Arm)   Pulse 99   Temp 98.4 F (36.9 C) (Oral)   Resp 17   Ht 6\' 2"  (1.88 m)   Wt 103.4 kg   SpO2 91%   BMI 29.27 kg/m  Pain Scale: 0-10 POSS *See Group Information*: 1-Acceptable,Awake and alert Pain Score: 5    SpO2: SpO2: 91 % O2 Device:SpO2: 91 % O2 Flow Rate: .O2 Flow Rate (L/min): 2 L/min  IO: Intake/output summary:   Intake/Output Summary (Last 24 hours) at 07/13/2018 1234 Last data filed at 07/13/2018 1145 Gross per 24 hour  Intake 238.42 ml  Output 1450 ml  Net -1211.58 ml    LBM: Last BM Date: 07/08/18 Baseline Weight: Weight: 103.4 kg Most recent weight: Weight: 103.4 kg     Palliative Assessment/Data:     Time In: 12:00 Time Out: 12:50 Time Total: 50 min Greater than 50%  of this time was spent counseling and coordinating care related to the above assessment  and plan.  Signed by: Asencion Gowda, NP   Please contact Palliative Medicine Team phone at [phone] for questions and concerns.  For individual provider: See Shea Evans

## 2018-07-13 NOTE — Progress Notes (Signed)
Per [name] pt can have a few ice chips.

## 2018-07-13 NOTE — Consult Note (Signed)
[facility] SURGICAL CONSULTATION NOTE (initial) - cpt: 27035   HISTORY OF PRESENT ILLNESS (HPI):  63 y.o. male presented to [facility] ED on 03/13 for evaluation of hematemesis. He has a a history of alcohol abuse, hepatitis C, liver cancer, liver cirrhosis with ascites. On 03/14 he underwent EGD ad was found to have bleeding esophageal varices which were banded. His Hgb was trended and monitored over the next few hospital days. On 03/17, he was complaining of abdominal pain and had not had any bowel movements in a few days. CT was concerning for small bowel obstruction. On interview this morning, the patient reports abdominal pain, distension, and nausea. He has not had an appetite. No reports of bowel function.  Surgery is consulted by hospitalist physician Dr. Jerelyn Charles, MD in this context for evaluation and management of small bowel obstruction.   PAST MEDICAL HISTORY (PMH):  Past Medical History:  Diagnosis Date  . Alcohol abuse   . Alcohol use   . Anemia   . Anxiety   . Arthritis   . Ascites   . Cancer [facility])    liver cancer  . Chronic hepatitis C ([place])   . COPD (chronic obstructive pulmonary disease) ([employer])   . Esophageal varices ([place])   . GERD (gastroesophageal reflux disease)   . Hepatitis C   . Hypertension   . Illiterate    cannot read or write  . Liver cirrhosis ([place])   . Peripheral vascular disease ([place])   . Tobacco use      PAST SURGICAL HISTORY ([place]):  Past Surgical History:  Procedure Laterality Date  . BACK SURGERY    . COLONOSCOPY WITH PROPOFOL N/A 01/21/2018   Procedure: COLONOSCOPY WITH PROPOFOL;  Surgeon: [name], MD;  [facility] ENDOSCOPY;  Service: Endoscopy;  Laterality: N/A;  . ESOPHAGOGASTRODUODENOSCOPY N/A 01/04/2017   Procedure: ESOPHAGOGASTRODUODENOSCOPY (EGD);  Surgeon: Lin Landsman, MD;  Location: [facility] ENDOSCOPY;  Service: Gastroenterology;  Laterality: N/A;  . ESOPHAGOGASTRODUODENOSCOPY N/A 07/09/2018   Procedure:  ESOPHAGOGASTRODUODENOSCOPY (EGD);  Surgeon: Lin Landsman, MD;  Location: [facility] ENDOSCOPY;  Service: Gastroenterology;  Laterality: N/A;  . ESOPHAGOGASTRODUODENOSCOPY (EGD) WITH PROPOFOL N/A 01/22/2017   Procedure: ESOPHAGOGASTRODUODENOSCOPY (EGD) WITH PROPOFOL;  Surgeon: Toledo, Benay Pike, MD;  Location: [facility] ENDOSCOPY;  Service: Gastroenterology;  Laterality: N/A;  . PARACENTESIS    . SPINE SURGERY     lower lumbar fused     MEDICATIONS:  Prior to Admission medications   Medication Sig Start Date End Date Taking? Authorizing Provider  albuterol (PROVENTIL HFA;VENTOLIN HFA) 108 (90 Base) MCG/ACT inhaler Inhale 2 puffs into the lungs every 4 (four) hours as needed for wheezing. 01/14/17  Yes Leslye Peer, Richard, MD  albuterol (PROVENTIL) (2.5 MG/3ML) 0.083% nebulizer solution Take 3 mLs (2.5 mg total) by nebulization every 4 (four) hours as needed for wheezing. 12/27/16  Yes Wieting, Richard, MD  clonazePAM (KLONOPIN) 0.5 MG tablet Take 1 tablet (0.5 mg total) by mouth 2 (two) times daily as needed for anxiety. 01/14/17  Yes Wieting, Richard, MD  esomeprazole (NEXIUM) 40 MG capsule Take 40 mg by mouth daily. 11/26/17  Yes [provider]  Fluticasone-Salmeterol (ADVAIR DISKUS) 250-50 MCG/DOSE AEPB Inhale 1 puff into the lungs 2 (two) times daily. 01/14/17  Yes Wieting, Richard, MD  furosemide (LASIX) 40 MG tablet Take 1 tablet (40 mg total) by mouth daily. Patient taking differently: Take 20 mg by mouth daily.  01/05/17 07/08/18 Yes Demetrios Loll, MD  propranolol (INDERAL) 10 MG tablet Take 10 mg  by mouth 2 (two) times daily. 11/04/17  Yes [provider]  traMADol (ULTRAM) 50 MG tablet Take 1 tablet (50 mg total) by mouth every 6 (six) hours as needed for moderate pain. 01/14/17  Yes Wieting, Richard, MD  tiotropium (SPIRIVA HANDIHALER) 18 MCG inhalation capsule Place 1 capsule (18 mcg total) into inhaler and inhale daily. 01/14/17 01/21/18  Loletha Grayer, MD     ALLERGIES:  Allergies   Allergen Reactions  . Codeine Itching and Other (See Comments)    Other reaction(s): Other (See Comments) Back bone itching    . Indomethacin Itching    Other reaction(s): ITCHING     SOCIAL HISTORY:  Social History   Socioeconomic History  . Marital status: Single    Spouse name: Not on file  . Number of children: Not on file  . Years of education: Not on file  . Highest education level: Not on file  Occupational History  . Occupation: disabled  Social Needs  . Financial resource strain: Not on file  . Food insecurity:    Worry: Not on file    Inability: Not on file  . Transportation needs:    Medical: Not on file    Non-medical: Not on file  Tobacco Use  . Smoking status: Current Every Day Smoker    Packs/day: 0.50    Types: Cigarettes  . Smokeless tobacco: Former Network engineer and Sexual Activity  . Alcohol use: No    Comment: 2-3 months  . Drug use: No  . Sexual activity: Not Currently    Birth control/protection: Abstinence  Lifestyle  . Physical activity:    Days per week: Not on file    Minutes per session: Not on file  . Stress: Not on file  Relationships  . Social connections:    Talks on phone: Not on file    Gets together: Not on file    Attends religious service: Not on file    Active member of club or organization: Not on file    Attends meetings of clubs or organizations: Not on file    Relationship status: Not on file  . Intimate partner violence:    Fear of current or ex partner: Not on file    Emotionally abused: Not on file    Physically abused: Not on file    Forced sexual activity: Not on file  Other Topics Concern  . Not on file  Social History Narrative   Lives at home by himself. Independent at baseline     FAMILY HISTORY:  Family History  Problem Relation Age of Onset  . Cancer Mother   . Heart attack Father       REVIEW OF SYSTEMS:  Review of Systems  Constitutional: Negative for chills and fever.  Respiratory:  Negative for cough and shortness of breath.   Cardiovascular: Negative for chest pain and palpitations.  Gastrointestinal: Positive for abdominal pain, nausea and vomiting. Negative for constipation and diarrhea.  Genitourinary: Negative for dysuria and urgency.  Neurological: Negative for dizziness and headaches.  All other systems reviewed and are negative.   VITAL SIGNS:  Temp:  [98 F (36.7 C)-98.2 F (36.8 C)] 98 F (36.7 C) (03/18 0623) Pulse Rate:  [100-109] 100 (03/18 0624) Resp:  [20-24] 24 (03/18 0623) BP: (111-117)/(70-83) 111/76 (03/18 0623) SpO2:  [86 %-98 %] 91 % (03/18 0624)     Height: 6\' 2"  (188 cm) Weight: 103.4 kg BMI (Calculated): 29.26   INTAKE/OUTPUT:  This shift: Total  I/O In: -  Out: 800 [Urine:800]  Last 2 shifts: @IOLAST2SHIFTS @   PHYSICAL EXAM:  Physical Exam Vitals signs and nursing note reviewed.  Constitutional:      General: He is not in acute distress.    Appearance: He is well-developed. He is obese. He is not ill-appearing.  Eyes:     Pupils: Pupils are equal, round, and reactive to light.  Cardiovascular:     Rate and Rhythm: Normal rate and regular rhythm.     Heart sounds: No murmur.  Pulmonary:     Effort: Pulmonary effort is normal. No tachypnea.     Breath sounds: Normal breath sounds. No decreased breath sounds.  Abdominal:     General: Abdomen is protuberant. There is no distension.     Tenderness: There is generalized abdominal tenderness. There is no guarding or rebound.  Genitourinary:    Comments: Deferred Musculoskeletal: Normal range of motion.        General: No swelling or deformity.  Skin:    General: Skin is warm and dry.  Neurological:     General: No focal deficit present.     Mental Status: He is alert. He is disoriented.  Psychiatric:        Mood and Affect: Mood normal.        Behavior: Behavior normal.       Labs:  CBC Latest Ref Rng & Units 07/13/2018 07/11/2018 07/10/2018  WBC 4.0 - 10.5 K/uL 11.8(H)  - 7.1  Hemoglobin 13.0 - 17.0 g/dL 11.1(L) 12.0(L) 11.7(L)  Hematocrit 39.0 - 52.0 % 31.8(L) - 34.2(L)  Platelets 150 - 400 K/uL 78(L) - 78(L)   CMP Latest Ref Rng & Units 07/12/2018 07/10/2018 07/09/2018  Glucose 70 - 99 mg/dL 128(H) 123(H) 110(H)  BUN 8 - 23 mg/dL 15 17 17   Creatinine 0.61 - 1.24 mg/dL 1.06 0.83 0.78  Sodium 135 - 145 mmol/L 129(L) 132(L) 133(L)  Potassium 3.5 - 5.1 mmol/L 3.8 3.9 4.2  Chloride 98 - 111 mmol/L 93(L) 103 101  CO2 22 - 32 mmol/L 26 24 25   Calcium 8.9 - 10.3 mg/dL 8.8(L) 8.5(L) 8.4(L)  Total Protein 6.5 - 8.1 g/dL - 6.6 6.7  Total Bilirubin 0.3 - 1.2 mg/dL - 2.5(H) 2.8(H)  Alkaline Phos 38 - 126 U/L - 67 72  AST 15 - 41 U/L - 119(H) 129(H)  ALT 0 - 44 U/L - 85(H) 91(H)     Imaging studies:   CT Abdomen/Pelvis (07/12/2018) personally reviewed and radiologist report reviewed: IMPRESSION: 1) Small-bowel obstruction with the transition point in the distal ileum above the ileocecal valve. Associated distension through the length of small bowel as well as stomach. 2) Cirrhosis with sequela of portal hypertension including small volume ascites and splenomegaly.   Assessment/Plan:  63 y.o. male with abdominal pain, distension, nausea/emesis most likely attributable to small bowel obstruction, complicated by pertinent comorbidities including alcohol abuse, hepatitis C, liver cancer, liver cirrhosis with ascites, HTN, history anemia, and obesity.   - Recommend NGT placement for Decompression; monitor output   - Morning KUB for reassessment  - Pain control prn (minimize narcotics); antiemetics prn  - monitor abdominal examaintion  - No emergent surgical indication at this time. Patient is high risk for operative management; MELD score 33 (52% 3 month mortality)  - Esophageal varices; monitor hemoglobin; GI following   - Medical management   All of the above findings and recommendations were discussed with the patient and the medical team, and all of  patient's  questions were answered to his expressed satisfaction.  Thank you for the opportunity to participate in this patient's care.   -- [name], PA-C [facility] 07/13/2018, 8:53 AM [phone] M-F: 7am - 4pm

## 2018-07-13 NOTE — Progress Notes (Signed)
Initial Nutrition Assessment  DOCUMENTATION CODES:   Not applicable  INTERVENTION:   RD will monitor for diet advancement vs the need for nutrition support  Consider TPN if unable to advance diet in the next 1-2 days  Daily weights   NUTRITION DIAGNOSIS:   Increased nutrient needs related to chronic illness(cirrhosis, liver cancer, COPD) as evidenced by increased estimated needs.  GOAL:   Patient will meet greater than or equal to 90% of their needs  MONITOR:   Labs, Weight trends, Skin, I & O's  REASON FOR ASSESSMENT:   LOS    ASSESSMENT:   63 y.o. male presented to [facility] ED on 03/13 for evaluation of hematemesis. Pt with history of alcohol abuse, hepatitis C, liver cancer, liver cirrhosis with ascites. On 03/14 he underwent EGD ad was found to have bleeding esophageal varices which were banded. On 03/17, he was complaining of abdominal pain and had not had any bowel movements in a few days. CT concerning for small bowel obstruction.    Pt s/p paracentesis with 2.3L output on 3/16  Met with pt in room today. Pt reports poor appetite and oral intake at baseline. Pt reports that his normal oral intake would include one meal per day, usually a sandwich of some sort. Pt does not drink supplements as he believes they cause him to be constipated. Pt reports his appetite has continued to be poor since admit. Pt advanced to a soft diet on 3/16 and was documented to have eaten 90% of lunch, otherwise, pt without adequate nutrition since admit. Pt NPO today secondary to SBO. NGT in place with >655m output. Pt reports continued left lower abdominal pain and nausea. Pt reports chronic constipation pta for which he drinks a lot of prune juice for; pt reports constipation has been going on for years. Pt is willing to drink nutritional supplements "if I have to". Consider TPN if unable to advance pt's diet in the next 1-2 days. Pt at high refeeding risk; recommend monitor K, Mg and P labs  daily.   Medications reviewed and include: dulcolax, lasix, hydromorphone, nicotine, protonix, aldactone, ceftriaxone, NaCl w/ 5% dextrose _0 /hr  Labs reviewed: Na 129(L), Cl 94(L), creat 1.44(H), AST 88(H), ALT 64(H), tbili- 1.5(H) Wbc- 11.8(H), Hgb 11.1(H), Hct 31.8(L)  NUTRITION - FOCUSED PHYSICAL EXAM:    Most Recent Value  Orbital Region  Moderate depletion  Upper Arm Region  Severe depletion  Thoracic and Lumbar Region  Mild depletion  Buccal Region  Mild depletion  Temple Region  Mild depletion  Clavicle Bone Region  Mild depletion  Clavicle and Acromion Bone Region  Mild depletion  Scapular Bone Region  Mild depletion  Dorsal Hand  No depletion  Patellar Region  No depletion  Anterior Thigh Region  No depletion  Posterior Calf Region  Mild depletion  Edema (RD Assessment)  None  Hair  Reviewed  Eyes  Reviewed  Mouth  Reviewed  Skin  Reviewed  Nails  Reviewed     Diet Order:   Diet Order            Diet NPO time specified Except for: Sips with Meds, Ice Chips  Diet effective now             EDUCATION NEEDS:   Education needs have been addressed  Skin:  Skin Assessment: Reviewed RN Assessment  Last BM:  3/17- type 6  Height:   Ht Readings from Last 1 Encounters:  07/08/18 _1  (1.88 m)  Weight:   Wt Readings from Last 1 Encounters:  07/08/18 103.4 kg    Ideal Body Weight:  86.3 kg  BMI:  Body mass index is 29.27 kg/m.  Estimated Nutritional Needs:   Kcal:  2300-2600kcal/day   Protein:  113-124g/day  Fluid:  2.1L/day or per MD  Koleen Distance MS, RD, LDN Pager #- [phone] Office#- [phone] After Hours Pager: [phone]

## 2018-07-13 NOTE — Progress Notes (Signed)
[place] [facility] [place] NAME: Bradley Dunlap    MR#:  [id number]  DATE OF BIRTH:  [birth year]  SUBJECTIVE:  CHIEF COMPLAINT:   Chief Complaint  Patient presents with  . Shortness of Breath  Patient complaining of abdominal pain only, CT abdomen noted, case discussed with Dr. Linton Ham surgery-for conservative management with NG tube placement for decompression/bowel rest/IV fluids for rehydration for now  REVIEW OF SYSTEMS:  CONSTITUTIONAL: No fever, fatigue or weakness.  EYES: No blurred or double vision.  EARS, NOSE, AND THROAT: No tinnitus or ear pain.  RESPIRATORY: No cough, shortness of breath, wheezing or hemoptysis.  CARDIOVASCULAR: No chest pain, orthopnea, edema.  GASTROINTESTINAL: No nausea, vomiting, diarrhea or abdominal pain.  GENITOURINARY: No dysuria, hematuria.  ENDOCRINE: No polyuria, nocturia,  HEMATOLOGY: No anemia, easy bruising or bleeding SKIN: No rash or lesion. MUSCULOSKELETAL: No joint pain or arthritis.   NEUROLOGIC: No tingling, numbness, weakness.  PSYCHIATRY: No anxiety or depression.   ROS  DRUG ALLERGIES:   Allergies  Allergen Reactions  . Codeine Itching and Other (See Comments)    Other reaction(s): Other (See Comments) Back bone itching    . Indomethacin Itching    Other reaction(s): ITCHING    VITALS:  Blood pressure 111/76, pulse 100, temperature 98 F (36.7 C), temperature source Oral, resp. rate (!) 24, height 6\' 2"  (1.88 m), weight 103.4 kg, SpO2 91 %.  PHYSICAL EXAMINATION:  GENERAL:  63 y.o.-year-old patient lying in the bed with no acute distress.  EYES: Pupils equal, round, reactive to light and accommodation. No scleral icterus. Extraocular muscles intact.  HEENT: Head atraumatic, normocephalic. Oropharynx and nasopharynx clear.  NECK:  Supple, no jugular venous distention. No thyroid enlargement, no tenderness.  LUNGS: Normal breath sounds bilaterally, no wheezing, rales,rhonchi or  crepitation. No use of accessory muscles of respiration.  CARDIOVASCULAR: S1, S2 normal. No murmurs, rubs, or gallops.  ABDOMEN: Soft, nontender, nondistended. Bowel sounds present. No organomegaly or mass.  EXTREMITIES: No pedal edema, cyanosis, or clubbing.  NEUROLOGIC: Cranial nerves II through XII are intact. Muscle strength 5/5 in all extremities. Sensation intact. Gait not checked.  PSYCHIATRIC: The patient is alert and oriented x 3.  SKIN: No obvious rash, lesion, or ulcer.   Physical Exam LABORATORY PANEL:   CBC Recent Labs  Lab 07/13/18 0834  WBC 11.8*  HGB 11.1*  HCT 31.8*  PLT 78*   ------------------------------------------------------------------------------------------------------------------  Chemistries  Recent Labs  Lab 07/13/18 0834  NA 129*  K 4.0  CL 94*  CO2 27  GLUCOSE 121*  BUN 20  CREATININE 1.44*  CALCIUM 8.9  AST 88*  ALT 64*  ALKPHOS 58  BILITOT 3.8*   ------------------------------------------------------------------------------------------------------------------  Cardiac Enzymes No results for input(s): TROPONINI in the last 168 hours. ------------------------------------------------------------------------------------------------------------------  RADIOLOGY:  Dg Abd 1 View  Result Date: 07/13/2018 CLINICAL DATA:  Nasogastric tube EXAM: ABDOMEN - 1 VIEW COMPARISON:  Abdominal CT from yesterday FINDINGS: Nasogastric tube with tip over the stomach and side-port over the GE junction. Dilated small bowel over the upper abdomen. Lung bases are clear. IMPRESSION: 1. Nasogastric tube tip is at the stomach. The side port is over the lower esophagus. 2. Ongoing bowel obstruction. Electronically Signed   By: Monte Fantasia M.D.   On: 07/13/2018 11:32   Ct Abdomen Pelvis W Contrast  Result Date: 07/12/2018 CLINICAL DATA:  63 year old male with a history abdominal pain with nausea and vomiting EXAM: CT ABDOMEN AND PELVIS WITH CONTRAST TECHNIQUE:  Multidetector CT imaging of the abdomen and pelvis was performed using the standard protocol following bolus administration of intravenous contrast. CONTRAST:  188mL OMNIPAQUE IOHEXOL 300 MG/ML  SOLN COMPARISON:  05/10/2018, CT chest 07/08/2018 FINDINGS: Lower chest: Atelectatic changes/scarring at the lung bases. Hepatobiliary: Cirrhotic changes of the liver with right liver lobe hypertrophy, nodular contour, caudate lobe hypertrophy. Redemonstration of low-density lesion on the lateral right liver, compatible with prior treatment history. Calcified cholelithiasis within the gallbladder. High density material compatible with vicarious excretion of contrast. Pancreas: Unremarkable Spleen: Unremarkable spleen Adrenals/Urinary Tract: Unremarkable adrenal glands. Right kidney without hydronephrosis or nephrolithiasis. Unremarkable course of the right ureter. Ectopic left kidney again noted, overlying the proximal left common iliac artery near the bifurcation of the aorta. Unremarkable course of the left ureter. No hydronephrosis. Stomach/Bowel: Hiatal hernia. Distention of the stomach. Significant distension of proximal mid and distal small bowel. There is flocculent material within the distal small bowel loops, compatible with stasis. The transition is within the distal ileum, just above the ileocecal junction. Colon is relatively decompressed with moderate stool burden. No focal inflammatory changes of colon. Vascular/Lymphatic: Atherosclerotic changes of the abdominal aorta. Mesenteric arteries and renal arteries are patent. Bilateral iliac arteries patent. Proximal SFA and profunda femoris patent. No lymphadenopathy. Small lymph nodes in the para-aortic/preaortic nodal stations. Low volume of low-density ascites, predominantly right upper quadrant. Reproductive: Unremarkable pelvic structures Other: Fluid within paraumbilical hernia, unchanged from the comparison. Musculoskeletal: Degenerative changes of the spine.  Surgical changes of the posterior elements of the lumbar spine. Degenerative disc disease. No fracture. Degenerative changes of the hips. IMPRESSION: Small-bowel obstruction with the transition point in the distal ileum above the ileocecal valve. Associated distension through the length of small bowel as well as stomach. Cirrhosis with sequela of portal hypertension including small volume ascites and splenomegaly. Aortic Atherosclerosis (ICD10-I70.0). Cholelithiasis. Additional ancillary findings as above. Electronically Signed   By: Corrie Mckusick D.O.   On: 07/12/2018 16:44   Dg Abd Decub  Result Date: 07/12/2018 CLINICAL DATA:  Abdominal pain and distension. EXAM: ABDOMEN - 1 VIEW DECUBITUS COMPARISON:  CT abdomen pelvis dated May 10, 2018. FINDINGS: There are few mildly dilated loops of small bowel with air-fluid levels. Air-fluid levels within nondilated colon. No pneumoperitoneum. IMPRESSION: 1. Several mildly dilated loops of small bowel with air-fluid levels. Non-dilated colon with air-fluid levels. Findings are favored to reflect ileus given both large and small bowel involvement. Correlate for obstruction and consider CT as clinically indicated. Electronically Signed   By: Titus Dubin M.D.   On: 07/12/2018 12:23    ASSESSMENT AND PLAN:  *Acute small bowel obstruction with transition point  Noted on CT abdomen  General surgery input appreciated -for NG tube placement/bowel rest/IV fluids for rehydration, adult pain protocol   *Esophageal variceal bleed Status post banding by gastroenterology Continue Protonix, Octreotide discontinue, hemoglobin stable   *Chronic liver cirrhosis with ascites, hepatitis C, thrombocytopenia, elevated liver function test, hypercoagulable state Meld score 33 with 73-month mortality rate of 52% Palliative care consulted given poor prognosis  *COPD w/ wheeze Patient states he always wheezes Continue breathing treatments PRN, Advair twice  daily  *Chronic GAD, NOS  Stable  Continue anxiolytics   *Chronic tobacco smoker abuse/dependency  Cessation counseling ordered, nicotine patch daily   *Acute Left rib pain Improved X-ray negative for fracture  Case discussed with the patient's sister at length regarding poor prognosis, advanced directives were addressed as well given patient has somewhat mild mental disability/low IQ per sister  All the records are reviewed and case discussed with Care Management/Social Workerr. Management plans discussed with the patient, family and they are in agreement.  CODE STATUS: full  TOTAL TIME TAKING CARE OF THIS PATIENT: 40 minutes.     POSSIBLE D/C IN 2-5 DAYS, DEPENDING ON CLINICAL CONDITION.   Avel Peace Armistead Sult M.D on 07/13/2018   Between 7am to 6pm - Pager - [phone]  After 6pm go to [email or web address] - password [email or web address] [facility]  Office  [phone]  CC: Primary care physician; Ellene Route  Note: This dictation was prepared with Dragon dictation along with smaller phrase technology. Any transcriptional errors that result from this process are unintentional.

## 2018-07-14 ENCOUNTER — Inpatient Hospital Stay (HOSPITAL_COMMUNITY)
Admit: 2018-07-14 | Discharge: 2018-07-14 | Disposition: A | Discharge status: Home / Self Care | Payer: Medicaid Other | Attending: Family Medicine | Admitting: Family Medicine

## 2018-07-14 ENCOUNTER — Inpatient Hospital Stay: Payer: Medicaid Other

## 2018-07-14 DIAGNOSIS — R012 Other cardiac sounds: Secondary | ICD-10-CM

## 2018-07-14 LAB — CBC
HCT: 32.9 % — ABNORMAL LOW (ref 39.0–52.0)
Hemoglobin: 11.1 g/dL — ABNORMAL LOW (ref 13.0–17.0)
MCH: 33 pg (ref 26.0–34.0)
MCHC: 33.7 g/dL (ref 30.0–36.0)
MCV: 97.9 fL (ref 80.0–100.0)
Platelets: 78 10*3/uL — ABNORMAL LOW (ref 150–400)
RBC: 3.36 MIL/uL — ABNORMAL LOW (ref 4.22–5.81)
RDW: 16.8 % — ABNORMAL HIGH (ref 11.5–15.5)
WBC: 9.9 10*3/uL (ref 4.0–10.5)
nRBC: 0 % (ref 0.0–0.2)

## 2018-07-14 LAB — BASIC METABOLIC PANEL
Anion gap: 9 (ref 5–15)
BUN: 20 mg/dL (ref 8–23)
CO2: 27 mmol/L (ref 22–32)
Calcium: 8.6 mg/dL — ABNORMAL LOW (ref 8.9–10.3)
Chloride: 96 mmol/L — ABNORMAL LOW (ref 98–111)
Creatinine, Ser: 1.3 mg/dL — ABNORMAL HIGH (ref 0.61–1.24)
GFR calc Af Amer: >60 mL/min (ref >60)
GFR calc non Af Amer: 58 mL/min — ABNORMAL LOW (ref >60)
Glucose, Bld: 116 mg/dL — ABNORMAL HIGH (ref 70–99)
Potassium: 3.6 mmol/L (ref 3.5–5.1)
Sodium: 132 mmol/L — ABNORMAL LOW (ref 135–145)

## 2018-07-14 LAB — BODY FLUID CULTURE: Culture: NO GROWTH

## 2018-07-14 LAB — MAGNESIUM: Magnesium: 2.4 mg/dL (ref 1.7–2.4)

## 2018-07-14 LAB — PHOSPHORUS: Phosphorus: 2.8 mg/dL (ref 2.5–4.6)

## 2018-07-14 MED ORDER — ALBUTEROL SULFATE (2.5 MG/3ML) 0.083% IN NEBU
2.5000 mg | INHALATION_SOLUTION | Freq: Three times a day (TID) | RESPIRATORY_TRACT | Status: DC
  Administered 2018-07-14 – 2018-07-16 (×7): 2.5 mg via RESPIRATORY_TRACT
  Filled 2018-07-14 (×7): qty 3

## 2018-07-14 MED ORDER — ALBUTEROL SULFATE (2.5 MG/3ML) 0.083% IN NEBU
2.5000 mg | INHALATION_SOLUTION | Freq: Four times a day (QID) | RESPIRATORY_TRACT | Status: DC | PRN
  Administered 2018-07-18 (×2): 2.5 mg via RESPIRATORY_TRACT
  Filled 2018-07-14 (×2): qty 3

## 2018-07-14 NOTE — Progress Notes (Signed)
Physical Therapy Treatment Patient Details Name: Bradley Dunlap MRN: [id number] DOB: [birth year] Today's Date: 07/14/2018    History of Present Illness 63 yo male with onset of abd pain, nausea and cirrhosis with ascites is scheduled to have a paracentesis tomorrow, but currently noted bronchitis an cleared with chest x-ray.  PMHx:  EtOH abuse, anemia, ascites, anxiety, liver CA, Hep C, HTN, PVD, illiterate    PT Comments    Pt ready to walk.  Self directing session and somewhat agitated when he is cued or re-directing.  He was able to get out of bed without assist and walk 120' in hallway with min guard.  Some balance deficits noted but they could be attributed to pt trying to hold onto catheter tube despite cues to let it go.  Attempted BM but no results.  Returned to bed after session per his request.  RN in to manage NG tube.   Follow Up Recommendations  Home health PT;Supervision for mobility/OOB     Equipment Recommendations  Rolling walker with 5" wheels    Recommendations for Other Services       Precautions / Restrictions Precautions Precautions: Fall Precaution Comments: monitor O2 sats Restrictions Weight Bearing Restrictions: No    Mobility  Bed Mobility Overal bed mobility: Modified Independent                Transfers Overall transfer level: Modified independent Equipment used: Rolling walker (2 wheeled)                Ambulation/Gait Ambulation/Gait assistance: Min guard Gait Distance (Feet): 120 Feet Assistive device: Rolling walker (2 wheeled) Gait Pattern/deviations: Step-through pattern;Decreased stride length;Wide base of support;Trunk flexed Gait velocity: reduced       Stairs             Wheelchair Mobility    Modified Rankin (Stroke Patients Only)       Balance Overall balance assessment: Needs assistance Sitting-balance support: Feet supported Sitting balance-Leahy Scale: Good     Standing balance support:  Bilateral upper extremity supported Standing balance-Leahy Scale: Fair                              Cognition Arousal/Alertness: Awake/alert Behavior During Therapy: WFL for tasks assessed/performed Overall Cognitive Status: Within Functional Limits for tasks assessed                                        Exercises      General Comments        Pertinent Vitals/Pain Pain Assessment: No/denies pain    Home Living                      Prior Function            PT Goals (current goals can now be found in the care plan section) Progress towards PT goals: Progressing toward goals    Frequency    Min 2X/week      PT Plan Current plan remains appropriate    Co-evaluation              AM-PAC PT "6 Clicks" Mobility   Outcome Measure  Help needed turning from your back to your side while in a flat bed without using bedrails?: A Little Help needed moving from lying on your back to sitting  on the side of a flat bed without using bedrails?: A Little Help needed moving to and from a bed to a chair (including a wheelchair)?: A Little Help needed standing up from a chair using your arms (e.g., wheelchair or bedside chair)?: A Little Help needed to walk in hospital room?: A Little Help needed climbing 3-5 steps with a railing? : A Lot 6 Click Score: 17    End of Session Equipment Utilized During Treatment: Gait belt Activity Tolerance: Patient tolerated treatment well;Patient limited by pain Patient left: in bed;with call bell/phone within reach;with bed alarm set Nurse Communication: Mobility status       Time: 1030-1056 PT Time Calculation (min) (ACUTE ONLY): 26 min  Charges:  $Gait Training: 23-37 mins                     Chesley Noon, PTA 07/14/18, 11:10 AM

## 2018-07-14 NOTE — Progress Notes (Addendum)
Daily Progress Note   Patient Name: Bradley Dunlap       Date: 07/14/2018 DOB: [birth year]  Age: 63 y.o. MRN#: [id number] Attending Physician: Gorden Harms, MD Primary Care Physician: Ellene Route Admit Date: 07/08/2018  Reason for Consultation/Follow-up: Establishing goals of care  Subjective: Patient is resting in bed. He has a Living Will packet next to his bed, he states he did not request this. He states he has been talking with his brother about a nursing home as his sister lives far away, and his brother is in poor health himself. We discussed GOC. He understands his prognosis.  He states he does not know what he wants to do moving forward. He states right now he wants any care possible to extend his life including CPR, but states that in a month that could change.   Of note, he states that he cannot read except for a few basic things.   Length of Stay: 6  Current Medications: Scheduled Meds:  . albuterol  2.5 mg Nebulization TID  . bisacodyl  10 mg Rectal QHS  . furosemide  40 mg Oral BID  . mometasone-formoterol  2 puff Inhalation BID  . nicotine  14 mg Transdermal Daily  . pantoprazole  40 mg Intravenous Q12H  . sodium phosphate  1 enema Rectal Daily  . spironolactone  100 mg Oral Daily  . tamsulosin  0.4 mg Oral QPC breakfast  . tiotropium  18 mcg Inhalation Daily    Continuous Infusions: . sodium chloride Stopped (07/12/18 0813)  . cefTRIAXone (ROCEPHIN)  IV 2 g (07/14/18 0553)  . dextrose 5 % and 0.9% NaCl 100 mL/hr at 07/14/18 0806    PRN Meds: sodium chloride, albuterol, alum & mag hydroxide-simeth, clonazePAM, HYDROmorphone (DILAUDID) injection, ondansetron **OR** ondansetron (ZOFRAN) IV, phenol, traMADol  Physical Exam Pulmonary:     Effort:  Pulmonary effort is normal.  Neurological:     Mental Status: He is alert.     Comments: Oriented             Vital Signs: BP 119/85 (BP Location: Right Arm)   Pulse (!) 101   Temp 99 F (37.2 C) (Oral)   Resp 20   Ht 6\' 2"  (1.88 m)   Wt 104 kg   SpO2 91%   BMI 29.44 kg/m  SpO2: SpO2: 91 % O2 Device: O2 Device: Nasal Cannula O2 Flow Rate: O2 Flow Rate (L/min): 2 L/min  Intake/output summary:   Intake/Output Summary (Last 24 hours) at 07/14/2018 1108 Last data filed at 07/14/2018 0700 Gross per 24 hour  Intake 1658.64 ml  Output 3250 ml  Net -1591.36 ml   LBM: Last BM Date: 07/12/18 Baseline Weight: Weight: 103.4 kg Most recent weight: Weight: 104 kg       Palliative Assessment/Data:    Flowsheet Rows     Most Recent Value  Intake Tab  Referral Department  Hospitalist  Unit at Time of Referral  Med/Surg Unit  Palliative Care Primary Diagnosis  Cancer  Date Notified  07/13/18  Palliative Care Type  New Palliative care  Reason for referral  Clarify Goals of Care  Date of Admission  07/08/18  Date first seen by Palliative Care  07/13/18  # of days Palliative referral response time  0 Day(s)  # of days IP prior to Palliative referral  5  Clinical Assessment  Psychosocial & Spiritual Assessment  Palliative Care Outcomes      Patient Active Problem List   Diagnosis Date Noted  . SBO (small bowel obstruction) ([place])   . Cirrhosis of liver with ascites ([place])   . Hepatocellular carcinoma ([place]) 03/12/2017  . Alcoholic cirrhosis of liver with ascites ([place]) 01/27/2017  . Peritonitis ([place]) 01/13/2017  . Hematemesis 01/03/2017  . Ascites 12/24/2016  . Chronic viral hepatitis C ([place]) 07/02/2015  . Chronic pain syndrome 02/24/2013  . Neck pain 07/16/2005  . Chronic obstructive pulmonary disease ([place]) 08/01/2003  . Anxiety state 07/27/2003    Palliative Care Assessment & Plan    Recommendations/Plan:  Recommend palliative to follow at D/C.   Will follow.    Code Status:    Code Status Orders  (From admission, onward)         Start     Ordered   07/08/18 2150  Full code  Continuous     07/08/18 2150        Code Status History    Date Active Date Inactive Code Status Order ID Comments User Context   01/13/2017 0632 01/14/2017 1829 Full Code [id number]  Saundra Shelling, MD Inpatient   01/03/2017 1746 01/05/2017 1547 Full Code [id number]  Gladstone Lighter, MD Inpatient   12/24/2016 1337 12/27/2016 1834 Full Code [id number]  Fritzi Mandes, MD Inpatient       Prognosis:   < 6 months    Care plan was discussed with primary team  Thank you for allowing the Palliative Medicine Team to assist in the care of this patient.   Total Time 35 min Prolonged Time Billed no      Greater than 50%  of this time was spent counseling and coordinating care related to the above assessment and plan.  Asencion Gowda, NP  Please contact Palliative Medicine Team phone at [phone] for questions and concerns.

## 2018-07-14 NOTE — Progress Notes (Signed)
Informed on call hospitalist, Dr. Marcille Blanco of abnormal platelet value of 78

## 2018-07-14 NOTE — Progress Notes (Signed)
[facility] SURGICAL PROGRESS NOTE (cpt [id number])  Hospital Day(s): 6.   Post op day(s): 5 Days Post-Op.   Interval History: Patient seen and examined, no acute events or new complaints overnight. Patient reports that since the placement of his NGT he feels a little better. NGT has put out 2.7L since placement. He denied any abdominal pain, emesis, fever, or chills. He does endorse some nausea. No reports of flatus. No other complaints this morning.   Review of Systems:  Constitutional: denies fever, chills  HEENT: + Scleral Icterus Respiratory: denies any shortness of breath  Cardiovascular: denies chest pain or palpitations  Gastrointestinal: denies abdominal pain, + nausea, denied Vomiting, or diarrhea/and bowel function as per interval history Integumentary: + Juandice    Vital signs in last 24 hours: [min-max] current  Temp:  [98.4 F (36.9 C)-99 F (37.2 C)] 99 F (37.2 C) (03/19 0557) Pulse Rate:  [93-101] 101 (03/19 0557) Resp:  [17-20] 20 (03/19 0557) BP: (104-119)/(60-85) 119/85 (03/19 0557) SpO2:  [91 %-96 %] 91 % (03/19 0741) Weight:  [104 kg] 104 kg (03/19 0500)     Height: 6\' 2"  (188 cm) Weight: 104 kg BMI (Calculated): 29.43   Intake/Output this shift:  No intake/output data recorded.    Physical Exam:  Constitutional: alert, cooperative and no distress  HENT: normocephalic without obvious abnormality Eyes: icteric sclera , EOM's grossly intact and symmetric   Respiratory: breathing non-labored at rest, on [place] Cardiovascular: regular rate and sinus rhythm  Gastrointestinal: Obese, soft, non-tender, and mild distension, appreciable fluid wave, tympanic to percussion Musculoskeletal: no edema or wounds, motor and sensation grossly intact, NT    Labs:  CBC Latest Ref Rng & Units 07/14/2018 07/13/2018 07/11/2018  WBC 4.0 - 10.5 K/uL 9.9 11.8(H) -  Hemoglobin 13.0 - 17.0 g/dL 11.1(L) 11.1(L) 12.0(L)  Hematocrit 39.0 - 52.0 % 32.9(L) 31.8(L) -   Platelets 150 - 400 K/uL 78(L) 78(L) -   CMP Latest Ref Rng & Units 07/14/2018 07/13/2018 07/12/2018  Glucose 70 - 99 mg/dL 116(H) 121(H) 128(H)  BUN 8 - 23 mg/dL 20 20 15   Creatinine 0.61 - 1.24 mg/dL 1.30(H) 1.44(H) 1.06  Sodium 135 - 145 mmol/L 132(L) 129(L) 129(L)  Potassium 3.5 - 5.1 mmol/L 3.6 4.0 3.8  Chloride 98 - 111 mmol/L 96(L) 94(L) 93(L)  CO2 22 - 32 mmol/L 27 27 26   Calcium 8.9 - 10.3 mg/dL 8.6(L) 8.9 8.8(L)  Total Protein 6.5 - 8.1 g/dL - 6.7 -  Total Bilirubin 0.3 - 1.2 mg/dL - 3.8(H) -  Alkaline Phos 38 - 126 U/L - 58 -  AST 15 - 41 U/L - 88(H) -  ALT 0 - 44 U/L - 64(H) -     Imaging studies:  KUB (07/14/2018) personally reviewed and radiologist report reviewed:  IMPRESSION: 1. Multiple persistent gas-filled dilated loops of small bowel throughout the abdomen, compatible with ongoing small bowel obstruction. 2. Tip of enteric tube in the proximal stomach, side hole in the distal esophagus.   Assessment/Plan: (ICD-10's: K47.60) 63 y.o. male with significant NGT output and persistent distension with no evidence of bowel function return attributable to partial small bowel obstruction, complicated by pertinent comorbidities including alcohol abuse, hepatitis C, liver cancer, liver cirrhosis with ascites, HTN, history anemia, and obesity.   - Continue NGT decompression; monitor output  - NPO, IVF   - Pain control prn (minimize narcotics); antiemetics prn             - monitor abdominal examaintion   -  Continue serial KUBs in the AM   - No emergent surgical indication at this time. Patient is high risk for operative management; MELD score 33 (52% 3 month mortality)             - Esophageal varices; monitor hemoglobin; GI following              - Medical management    All of the above findings and recommendations were discussed with the patient, and the medical team, and all of patient's questions were answered to his expressed satisfaction.  -- [name],  PA-C [facility] 07/14/2018, 10:04 AM [phone] M-F: 7am - 4pm

## 2018-07-14 NOTE — TOC Initial Note (Signed)
Transition of Care [facility]) - Initial/Assessment Note    Patient Details  Name: Bradley Dunlap MRN: [id number] Date of Birth: 05-03-1955  [facility]) CM/SW Contact:    Shela Leff, LCSW Phone Number: 07/14/2018, 11:48 AM  Clinical Narrative:     CSW spoke with patient this morning at bedside. CSW introduced self and explained role and purpose of visit. Patient lives alone but has a brother that is nearby. Patient reports his brother assists with transportation and getting him where he needs to go. Patient is open to having home health as recommended by PT and did not have a preference. Referral made to [facility] with [place]. Patient may require oxygen at discharge. CSW will follow and arrange if necessary.                Expected Discharge Plan: [place] Barriers to Discharge: Continued Medical Work up   Patient Goals and CMS Choice   CMS Medicare.gov Compare Post Acute Care list provided to:: Patient Choice offered to / list presented to : Patient  Expected Discharge Plan and Services Expected Discharge Plan: [place]       Living arrangements for the past 2 months: Single Family Home                     HH Arranged: RN, PT, Social Work, Nurse's Aide [place] Agency: [place] ([facility])  Prior Living Arrangements/Services Living arrangements for the past 2 months: [place] Lives with:: Self Patient language and need for interpreter reviewed:: Yes Do you feel safe going back to the place where you live?: Yes      Need for Family Participation in Patient Care: Yes (Comment) Care giver support system in place?: Yes (comment)   Criminal Activity/Legal Involvement Pertinent to Current Situation/Hospitalization: No - Comment as needed  Activities of Daily Living Home Assistive Devices/Equipment: Cane (specify quad or straight) ADL Screening (condition at time of admission) Patient's cognitive ability  adequate to safely complete daily activities?: Yes Is the patient deaf or have difficulty hearing?: Yes Does the patient have difficulty seeing, even when wearing glasses/contacts?: No Does the patient have difficulty concentrating, remembering, or making decisions?: No Patient able to express need for assistance with ADLs?: Yes Does the patient have difficulty dressing or bathing?: No Independently performs ADLs?: Yes (appropriate for developmental age) Does the patient have difficulty walking or climbing stairs?: Yes Weakness of Legs: Both Weakness of Arms/Hands: None  Permission Sought/Granted Permission sought to share information with : Case Manager                Emotional Assessment Appearance:: Appears stated age Attitude/Demeanor/Rapport: (pleasant and cooperative) Affect (typically observed): Accepting, Calm, Pleasant Orientation: : Oriented to Self, Oriented to Place, Oriented to  Time, Oriented to Situation Alcohol / Substance Use: Not Applicable Psych Involvement: No (comment)  Admission diagnosis:  Hemoptysis [R04.2] Hematemesis, presence of nausea not specified [K92.0] Patient Active Problem List   Diagnosis Date Noted  . SBO (small bowel obstruction) ([place])   . Cirrhosis of liver with ascites ([place])   . Hepatocellular carcinoma ([place]) 03/12/2017  . Alcoholic cirrhosis of liver with ascites ([place]) 01/27/2017  . Peritonitis ([place]) 01/13/2017  . Hematemesis 01/03/2017  . Ascites 12/24/2016  . Chronic viral hepatitis C ([place]) 07/02/2015  . Chronic pain syndrome 02/24/2013  . Neck pain 07/16/2005  . Chronic obstructive pulmonary disease ([place]) 08/01/2003  . Anxiety state 07/27/2003  PCP:  Ellene Route Pharmacy:   [address], [place] [address] Phone: [phone] Fax: [phone]     Social Determinants of Health (SDOH) Interventions    Readmission Risk Interventions No flowsheet data found.

## 2018-07-14 NOTE — Progress Notes (Signed)
Lucilla Lame, MD [facility]   [address], [place], [place] [address] Phone: [phone] Fax : [phone]   Subjective: The patient has had continued output from his NG tube.  The patient has left-sided abdominal pain that has continued.  He is still having abdominal distention and abdominal discomfort.  The patient's hemoglobin is stable.  The KUB showed the uppermost fenestration of the NG tube to be in the distal esophagus.  I have discussed with surgery advancing the tube a few centimeters.   Objective: Vital signs in last 24 hours: Vitals:   07/13/18 2057 07/14/18 0500 07/14/18 0557 07/14/18 0741  BP:   119/85   Pulse:   (!) 101   Resp:   20   Temp:   99 F (37.2 C)   TempSrc:   Oral   SpO2: 96%  92% 91%  Weight:  104 kg    Height:       Weight change:   Intake/Output Summary (Last 24 hours) at 07/14/2018 1000 Last data filed at 07/14/2018 0700 Gross per 24 hour  Intake 1658.64 ml  Output 3250 ml  Net -1591.36 ml     Exam: Heart:: Regular rate and rhythm, S1S2 present or without murmur or extra heart sounds Lungs: normal and clear to auscultation and percussion Abdomen: Distended with positive fluid wave with tympany and decreased bowel sounds.   Lab Results: @LABTEST2 @ Micro Results: Recent Results (from the past 240 hour(s))  Body fluid culture     Status: None (Preliminary result)   Collection Time: 07/11/18  9:23 AM  Result Value Ref Range Status   Specimen Description   Final    PERITONEAL Performed at [facility], [address], [place], [place] [address]    Special Requests   Final    NONE Performed at [facility], [address] [place], [place] [address]    Gram Stain   Final    WBC PRESENT,BOTH PMN AND MONONUCLEAR NO ORGANISMS SEEN    Culture   Final    NO GROWTH 2 DAYS Performed at [facility], [place] [address], [place],  [address]    Report Status PENDING  Incomplete   Studies/Results: Dg Abd  1 View  Result Date: 07/14/2018 CLINICAL DATA:  Follow-up examination for small bowel obstruction. EXAM: ABDOMEN - 1 VIEW COMPARISON:  Prior radiograph from 07/13/2018 FINDINGS: Enteric tube remains in place with tip overlying the proximal stomach, side hole in the distal esophagus. Persistent multiple dilated gas-filled loops of small bowel seen throughout the abdomen, compatible with ongoing small bowel obstruction. These measure up to approximately 6.5 cm in diameter. Paucity of gas distally. No appreciable free air on the supine views of the abdomen. Left basilar subsegmental atelectasis noted. Postoperative changes noted within the lumbosacral spine. IMPRESSION: 1. Multiple persistent gas-filled dilated loops of small bowel throughout the abdomen, compatible with ongoing small bowel obstruction. 2. Tip of enteric tube in the proximal stomach, side hole in the distal esophagus. Electronically Signed   By: Jeannine Boga M.D.   On: 07/14/2018 06:37   Dg Abd 1 View  Result Date: 07/13/2018 CLINICAL DATA:  Nasogastric tube EXAM: ABDOMEN - 1 VIEW COMPARISON:  Abdominal CT from yesterday FINDINGS: Nasogastric tube with tip over the stomach and side-port over the GE junction. Dilated small bowel over the upper abdomen. Lung bases are clear. IMPRESSION: 1. Nasogastric tube tip is at the stomach. The side port is over the lower esophagus. 2. Ongoing bowel obstruction.  Electronically Signed   By: Monte Fantasia M.D.   On: 07/13/2018 11:32   Ct Abdomen Pelvis W Contrast  Result Date: 07/12/2018 CLINICAL DATA:  64 year old male with a history abdominal pain with nausea and vomiting EXAM: CT ABDOMEN AND PELVIS WITH CONTRAST TECHNIQUE: Multidetector CT imaging of the abdomen and pelvis was performed using the standard protocol following bolus administration of intravenous contrast. CONTRAST:  185mL OMNIPAQUE IOHEXOL 300 MG/ML  SOLN COMPARISON:  05/10/2018, CT chest 07/08/2018 FINDINGS: Lower chest:  Atelectatic changes/scarring at the lung bases. Hepatobiliary: Cirrhotic changes of the liver with right liver lobe hypertrophy, nodular contour, caudate lobe hypertrophy. Redemonstration of low-density lesion on the lateral right liver, compatible with prior treatment history. Calcified cholelithiasis within the gallbladder. High density material compatible with vicarious excretion of contrast. Pancreas: Unremarkable Spleen: Unremarkable spleen Adrenals/Urinary Tract: Unremarkable adrenal glands. Right kidney without hydronephrosis or nephrolithiasis. Unremarkable course of the right ureter. Ectopic left kidney again noted, overlying the proximal left common iliac artery near the bifurcation of the aorta. Unremarkable course of the left ureter. No hydronephrosis. Stomach/Bowel: Hiatal hernia. Distention of the stomach. Significant distension of proximal mid and distal small bowel. There is flocculent material within the distal small bowel loops, compatible with stasis. The transition is within the distal ileum, just above the ileocecal junction. Colon is relatively decompressed with moderate stool burden. No focal inflammatory changes of colon. Vascular/Lymphatic: Atherosclerotic changes of the abdominal aorta. Mesenteric arteries and renal arteries are patent. Bilateral iliac arteries patent. Proximal SFA and profunda femoris patent. No lymphadenopathy. Small lymph nodes in the para-aortic/preaortic nodal stations. Low volume of low-density ascites, predominantly right upper quadrant. Reproductive: Unremarkable pelvic structures Other: Fluid within paraumbilical hernia, unchanged from the comparison. Musculoskeletal: Degenerative changes of the spine. Surgical changes of the posterior elements of the lumbar spine. Degenerative disc disease. No fracture. Degenerative changes of the hips. IMPRESSION: Small-bowel obstruction with the transition point in the distal ileum above the ileocecal valve. Associated  distension through the length of small bowel as well as stomach. Cirrhosis with sequela of portal hypertension including small volume ascites and splenomegaly. Aortic Atherosclerosis (ICD10-I70.0). Cholelithiasis. Additional ancillary findings as above. Electronically Signed   By: Corrie Mckusick D.O.   On: 07/12/2018 16:44   Dg Abd Decub  Result Date: 07/12/2018 CLINICAL DATA:  Abdominal pain and distension. EXAM: ABDOMEN - 1 VIEW DECUBITUS COMPARISON:  CT abdomen pelvis dated May 10, 2018. FINDINGS: There are few mildly dilated loops of small bowel with air-fluid levels. Air-fluid levels within nondilated colon. No pneumoperitoneum. IMPRESSION: 1. Several mildly dilated loops of small bowel with air-fluid levels. Non-dilated colon with air-fluid levels. Findings are favored to reflect ileus given both large and small bowel involvement. Correlate for obstruction and consider CT as clinically indicated. Electronically Signed   By: Titus Dubin M.D.   On: 07/12/2018 12:23   Medications: I have reviewed the patient's current medications. Scheduled Meds: . albuterol  2.5 mg Nebulization TID  . bisacodyl  10 mg Rectal QHS  . furosemide  40 mg Oral BID  . mometasone-formoterol  2 puff Inhalation BID  . nicotine  14 mg Transdermal Daily  . pantoprazole  40 mg Intravenous Q12H  . sodium phosphate  1 enema Rectal Daily  . spironolactone  100 mg Oral Daily  . tamsulosin  0.4 mg Oral QPC breakfast  . tiotropium  18 mcg Inhalation Daily   Continuous Infusions: . sodium chloride Stopped (07/12/18 0813)  . cefTRIAXone (ROCEPHIN)  IV 2 g (07/14/18  1916)  . dextrose 5 % and 0.9% NaCl 100 mL/hr at 07/14/18 0806   PRN Meds:.sodium chloride, albuterol, alum & mag hydroxide-simeth, clonazePAM, HYDROmorphone (DILAUDID) injection, ondansetron **OR** ondansetron (ZOFRAN) IV, phenol, traMADol   Assessment: Active Problems:   Hematemesis   Cirrhosis of liver with ascites (HCC)   SBO (small bowel  obstruction) ([place])    Plan: This patient has cirrhosis with hepatitis C and a small bowel obstruction being managed by surgery with an NG tube for decompression.  The patient has had copious amounts of output from the NG tube but continues to be distended.  I think further through from a GI point of view.  We will continue to follow.   LOS: 6 days   Journey Castonguay 07/14/2018, 10:00 AM

## 2018-07-14 NOTE — Progress Notes (Addendum)
[facility] [place] NAME: Bradley Dunlap    MR#:  [id number]  DATE OF BIRTH:  25-Jan-1956  SUBJECTIVE:  CHIEF COMPLAINT:   Chief Complaint  Patient presents with  . Shortness of Breath  Patient states that his abdominal pain is much better, no events overnight, case discussed with palliative care nurse, gastroenterology and general surgery input greatly appreciate, on conservative management with NG tube placement for decompression/bowel rest/IV fluids for rehydration for small bowel obstruction  REVIEW OF SYSTEMS:  CONSTITUTIONAL: No fever, fatigue or weakness.  EYES: No blurred or double vision.  EARS, NOSE, AND THROAT: No tinnitus or ear pain.  RESPIRATORY: No cough, shortness of breath, wheezing or hemoptysis.  CARDIOVASCULAR: No chest pain, orthopnea, edema.  GASTROINTESTINAL: No nausea, vomiting, diarrhea or abdominal pain.  GENITOURINARY: No dysuria, hematuria.  ENDOCRINE: No polyuria, nocturia,  HEMATOLOGY: No anemia, easy bruising or bleeding SKIN: No rash or lesion. MUSCULOSKELETAL: No joint pain or arthritis.   NEUROLOGIC: No tingling, numbness, weakness.  PSYCHIATRY: No anxiety or depression.   ROS  DRUG ALLERGIES:   Allergies  Allergen Reactions  . Codeine Itching and Other (See Comments)    Other reaction(s): Other (See Comments) Back bone itching    . Indomethacin Itching    Other reaction(s): ITCHING    VITALS:  Blood pressure 119/85, pulse (!) 101, temperature 99 F (37.2 C), temperature source Oral, resp. rate 20, height 6\' 2"  (1.88 m), weight 104 kg, SpO2 91 %.  PHYSICAL EXAMINATION:  GENERAL:  63 y.o.-year-old patient lying in the bed with no acute distress.  EYES: Pupils equal, round, reactive to light and accommodation. No scleral icterus. Extraocular muscles intact.  HEENT: Head atraumatic, normocephalic. Oropharynx and nasopharynx clear.  NECK:  Supple, no jugular venous distention. No thyroid  enlargement, no tenderness.  LUNGS: Normal breath sounds bilaterally, no wheezing, rales,rhonchi or crepitation. No use of accessory muscles of respiration.  CARDIOVASCULAR: S1, S2 normal. No murmurs, rubs, or gallops.  ABDOMEN: Soft, nontender, nondistended. Bowel sounds present. No organomegaly or mass.  EXTREMITIES: No pedal edema, cyanosis, or clubbing.  NEUROLOGIC: Cranial nerves II through XII are intact. Muscle strength 5/5 in all extremities. Sensation intact. Gait not checked.  PSYCHIATRIC: The patient is alert and oriented x 3.  SKIN: No obvious rash, lesion, or ulcer.   Physical Exam LABORATORY PANEL:   CBC Recent Labs  Lab 07/14/18 0324  WBC 9.9  HGB 11.1*  HCT 32.9*  PLT 78*   ------------------------------------------------------------------------------------------------------------------  Chemistries  Recent Labs  Lab 07/13/18 0834 07/14/18 0324  NA 129* 132*  K 4.0 3.6  CL 94* 96*  CO2 27 27  GLUCOSE 121* 116*  BUN 20 20  CREATININE 1.44* 1.30*  CALCIUM 8.9 8.6*  MG  --  2.4  AST 88*  --   ALT 64*  --   ALKPHOS 58  --   BILITOT 3.8*  --    ------------------------------------------------------------------------------------------------------------------  Cardiac Enzymes No results for input(s): TROPONINI in the last 168 hours. ------------------------------------------------------------------------------------------------------------------  RADIOLOGY:  Dg Abd 1 View  Result Date: 07/14/2018 CLINICAL DATA:  Follow-up examination for small bowel obstruction. EXAM: ABDOMEN - 1 VIEW COMPARISON:  Prior radiograph from 07/13/2018 FINDINGS: Enteric tube remains in place with tip overlying the proximal stomach, side hole in the distal esophagus. Persistent multiple dilated gas-filled loops of small bowel seen throughout the abdomen, compatible with ongoing small bowel obstruction. These measure up to approximately 6.5 cm in diameter. Paucity of gas  distally.  No appreciable free air on the supine views of the abdomen. Left basilar subsegmental atelectasis noted. Postoperative changes noted within the lumbosacral spine. IMPRESSION: 1. Multiple persistent gas-filled dilated loops of small bowel throughout the abdomen, compatible with ongoing small bowel obstruction. 2. Tip of enteric tube in the proximal stomach, side hole in the distal esophagus. Electronically Signed   By: Jeannine Boga M.D.   On: 07/14/2018 06:37   Dg Abd 1 View  Result Date: 07/13/2018 CLINICAL DATA:  Nasogastric tube EXAM: ABDOMEN - 1 VIEW COMPARISON:  Abdominal CT from yesterday FINDINGS: Nasogastric tube with tip over the stomach and side-port over the GE junction. Dilated small bowel over the upper abdomen. Lung bases are clear. IMPRESSION: 1. Nasogastric tube tip is at the stomach. The side port is over the lower esophagus. 2. Ongoing bowel obstruction. Electronically Signed   By: Monte Fantasia M.D.   On: 07/13/2018 11:32   Ct Abdomen Pelvis W Contrast  Result Date: 07/12/2018 CLINICAL DATA:  63 year old male with a history abdominal pain with nausea and vomiting EXAM: CT ABDOMEN AND PELVIS WITH CONTRAST TECHNIQUE: Multidetector CT imaging of the abdomen and pelvis was performed using the standard protocol following bolus administration of intravenous contrast. CONTRAST:  165mL OMNIPAQUE IOHEXOL 300 MG/ML  SOLN COMPARISON:  05/10/2018, CT chest 07/08/2018 FINDINGS: Lower chest: Atelectatic changes/scarring at the lung bases. Hepatobiliary: Cirrhotic changes of the liver with right liver lobe hypertrophy, nodular contour, caudate lobe hypertrophy. Redemonstration of low-density lesion on the lateral right liver, compatible with prior treatment history. Calcified cholelithiasis within the gallbladder. High density material compatible with vicarious excretion of contrast. Pancreas: Unremarkable Spleen: Unremarkable spleen Adrenals/Urinary Tract: Unremarkable adrenal glands. Right  kidney without hydronephrosis or nephrolithiasis. Unremarkable course of the right ureter. Ectopic left kidney again noted, overlying the proximal left common iliac artery near the bifurcation of the aorta. Unremarkable course of the left ureter. No hydronephrosis. Stomach/Bowel: Hiatal hernia. Distention of the stomach. Significant distension of proximal mid and distal small bowel. There is flocculent material within the distal small bowel loops, compatible with stasis. The transition is within the distal ileum, just above the ileocecal junction. Colon is relatively decompressed with moderate stool burden. No focal inflammatory changes of colon. Vascular/Lymphatic: Atherosclerotic changes of the abdominal aorta. Mesenteric arteries and renal arteries are patent. Bilateral iliac arteries patent. Proximal SFA and profunda femoris patent. No lymphadenopathy. Small lymph nodes in the para-aortic/preaortic nodal stations. Low volume of low-density ascites, predominantly right upper quadrant. Reproductive: Unremarkable pelvic structures Other: Fluid within paraumbilical hernia, unchanged from the comparison. Musculoskeletal: Degenerative changes of the spine. Surgical changes of the posterior elements of the lumbar spine. Degenerative disc disease. No fracture. Degenerative changes of the hips. IMPRESSION: Small-bowel obstruction with the transition point in the distal ileum above the ileocecal valve. Associated distension through the length of small bowel as well as stomach. Cirrhosis with sequela of portal hypertension including small volume ascites and splenomegaly. Aortic Atherosclerosis (ICD10-I70.0). Cholelithiasis. Additional ancillary findings as above. Electronically Signed   By: Corrie Mckusick D.O.   On: 07/12/2018 16:44    ASSESSMENT AND PLAN:  *Acute SBO with transition point  Noted on CT abdomen  General surgery input appreciated - on conservative medical management with NG tube for decompression/bowel  rest/IV fluids for rehydration, continue adult pain protocol   *Esophageal variceal bleed Status post banding by gastroenterology/Dr. Allen Norris Continue Protonix, Octreotide discontinued, hemoglobin stable   *Chronic liver cirrhosis with ascites, hepatitis C, thrombocytopenia, elevated liver function test,  hypercoagulable state Meld score 33 with 88-month mortality rate of 52% Palliative care input greatly appreciated   *COPD w/ wheeze Stable Patient states he always wheezes Continue breathing treatments PRN, Advair twice daily  *Chronic GAD, NOS  Stable  Continue anxiolytics   *Chronic tobacco smoker abuse/dependency  Cessation counseling ordered, nicotine patch daily   *Acute Left rib pain Improved X-ray negative for fracture  Case discussed with the patient's sister at length regarding poor prognosis, advanced directives were addressed as well given patient has somewhat mild mental disability/low IQ per sister  All the records are reviewed and case discussed with Care Management/Social Workerr. Management plans discussed with the patient, family and they are in agreement.  CODE STATUS: full  TOTAL TIME TAKING CARE OF THIS PATIENT: 40 minutes.     POSSIBLE D/C IN 2-4 DAYS, DEPENDING ON CLINICAL CONDITION.   Avel Peace Salary M.D on 07/14/2018   Between 7am to 6pm - Pager - [phone]  After 6pm go to [email or web address] - password [email or web address]  Office  [phone]  CC: Primary care physician; Ellene Route  Note: This dictation was prepared with Dragon dictation along with smaller phrase technology. Any transcriptional errors that result from this process are unintentional.

## 2018-07-15 ENCOUNTER — Inpatient Hospital Stay: Payer: Self-pay

## 2018-07-15 DIAGNOSIS — Z515 Encounter for palliative care: Secondary | ICD-10-CM

## 2018-07-15 DIAGNOSIS — Z7189 Other specified counseling: Secondary | ICD-10-CM

## 2018-07-15 LAB — BASIC METABOLIC PANEL
Anion gap: 5 (ref 5–15)
BUN: 18 mg/dL (ref 8–23)
CO2: 27 mmol/L (ref 22–32)
Calcium: 8.5 mg/dL — ABNORMAL LOW (ref 8.9–10.3)
Chloride: 103 mmol/L (ref 98–111)
Creatinine, Ser: 1.11 mg/dL (ref 0.61–1.24)
GFR calc Af Amer: >60 mL/min (ref >60)
GFR calc non Af Amer: >60 mL/min (ref >60)
Glucose, Bld: 113 mg/dL — ABNORMAL HIGH (ref 70–99)
Potassium: 3.9 mmol/L (ref 3.5–5.1)
Sodium: 135 mmol/L (ref 135–145)

## 2018-07-15 LAB — ECHOCARDIOGRAM COMPLETE
Height: 74 in
Weight: 3668.45 oz

## 2018-07-15 LAB — CBC
HCT: 31.7 % — ABNORMAL LOW (ref 39.0–52.0)
Hemoglobin: 10.9 g/dL — ABNORMAL LOW (ref 13.0–17.0)
MCH: 33.7 pg (ref 26.0–34.0)
MCHC: 34.4 g/dL (ref 30.0–36.0)
MCV: 98.1 fL (ref 80.0–100.0)
Platelets: 78 10*3/uL — ABNORMAL LOW (ref 150–400)
RBC: 3.23 MIL/uL — ABNORMAL LOW (ref 4.22–5.81)
RDW: 17.2 % — ABNORMAL HIGH (ref 11.5–15.5)
WBC: 8 10*3/uL (ref 4.0–10.5)
nRBC: 0 % (ref 0.0–0.2)

## 2018-07-15 LAB — TRIGLYCERIDES: Triglycerides: 57 mg/dL (ref <150)

## 2018-07-15 MED ORDER — TRACE MINERALS CR-CU-MN-SE-ZN 10-1000-500-60 MCG/ML IV SOLN
INTRAVENOUS | Status: AC
  Administered 2018-07-15: 19:00:00 via INTRAVENOUS
  Filled 2018-07-15: qty 960

## 2018-07-15 MED ORDER — SODIUM CHLORIDE 0.9% FLUSH: 10.0000 mL | INTRAVENOUS | Status: DC | PRN

## 2018-07-15 MED ORDER — SODIUM CHLORIDE 0.9 % IV SOLN
INTRAVENOUS | Status: DC
  Administered 2018-07-15 – 2018-07-16 (×2): via INTRAVENOUS

## 2018-07-15 MED ORDER — FAT EMULSION PLANT BASED 20 % IV EMUL
300.0000 mL | INTRAVENOUS | Status: AC
  Administered 2018-07-15: 300 mL via INTRAVENOUS
  Filled 2018-07-15: qty 300

## 2018-07-15 MED ORDER — SODIUM CHLORIDE 0.9% FLUSH
10.0000 mL | Freq: Two times a day (BID) | INTRAVENOUS | Status: DC
  Administered 2018-07-15: 20 mL
  Administered 2018-07-16: 10 mL
  Administered 2018-07-16: 20 mL
  Administered 2018-07-17 – 2018-07-18 (×3): 10 mL

## 2018-07-15 NOTE — Progress Notes (Signed)
Peripherally Inserted Central Catheter/Midline Placement  The IV Nurse has discussed with the patient and/or persons authorized to consent for the patient, the purpose of this procedure and the potential benefits and risks involved with this procedure.  The benefits include less needle sticks, lab draws from the catheter, and the patient may be discharged home with the catheter. Risks include, but not limited to, infection, bleeding, blood clot (thrombus formation), and puncture of an artery; nerve damage and irregular heartbeat and possibility to perform a PICC exchange if needed/ordered by physician.  Alternatives to this procedure were also discussed.  Bard Power PICC patient education guide, fact sheet on infection prevention and patient information card has been provided to patient /or left at bedside.  PICC inserted by Valentina Shaggy, RN   PICC/Midline Placement Documentation  PICC Double Lumen 21/97/58 PICC Right Basilic 40 cm 0 cm (Active)  Indication for Insertion or Continuance of Line Administration of hyperosmolar/irritating solutions (i.e. TPN, Vancomycin, etc.) 07/15/2018  3:47 PM  Exposed Catheter (cm) 0 cm 07/15/2018  3:47 PM  Site Assessment Clean;Dry;Intact 07/15/2018  3:47 PM  Lumen #1 Status Flushed;Saline locked;Blood return noted 07/15/2018  3:47 PM  Lumen #2 Status Flushed;Saline locked;Blood return noted 07/15/2018  3:47 PM  Dressing Type Transparent 07/15/2018  3:47 PM  Dressing Status Clean;Dry;Intact 07/15/2018  3:47 PM  Dressing Intervention New dressing 07/15/2018  3:47 PM  Dressing Change Due 07/22/18 07/15/2018  3:47 PM       Bradley Dunlap, Bradley Dunlap 07/15/2018, 3:48 PM

## 2018-07-15 NOTE — Progress Notes (Signed)
Notify Dr. Jannifer Franklin about patient stated TPN today, asked if needed to check CBG and Insulin, patient does not have history of DM, no orders given. RN will continue to monitor.

## 2018-07-15 NOTE — Consult Note (Addendum)
PHARMACY - ADULT TOTAL PARENTERAL NUTRITION CONSULT NOTE   Pharmacy Consult for TPN initation due to small bowel obstruction   Patient Measurements: Height: 6\' 2"  (188 cm) Weight: 218 lb 0.6 oz (98.9 kg) IBW/kg (Calculated) : 82.2 TPN AdjBW (KG): 87.5 Body mass index is 27.99 kg/m. Usual Weight: 98.9 kg IBW 86.3 kg  Assessment:   63 y.o. male [facility] ED on 03/13 for evaluation of hematemesis. Pt with history of alcohol abuse, hepatitis C, liver cancer, liver cirrhosis with ascites. On 03/14 he underwent EGD ad was found to have bleeding esophageal varices which were banded. On 03/17, he was complaining of abdominal pain and had not had any bowel movements in a few days. CT concerning for small bowel obstruction. Pt with continued abdominal pain; denies any flatus or BM. Abdomen remains distended. NGT in place with 677ml output. Pt with inadequate intake for 7 days; plan is for TPN today.   GI:  No nausea, vomiting, diarrhea or abdominal pain Endo: No polyuria, nocturia Insulin requirements in the past 24 hours:  Lytes: K 3.9, Mg 2.4, Phos 2.8  Renal: Scr trending down.  Pulm: on room air Cards: No chest pain, orthopnea, edema Hepatobil:Chronic liver cirrhosis with ascites, hepatitis C, thrombocytopenia, elevated liver function test, hypercoagulable state Neuro: No tingling, numbness, weakness.   TPN Access: PICC to be placed  TPN start date:07/15/2018 Nutritional Goals (per RD recommendation on 07/15/2018): KCal: 2300-2600 kcal/day Protein:113-124g/day Fluid: 2.1L/day  Goal TPN rate is 83 ml/hr KCal: 2014 kcal/day Protein: 100g/day Fluid: 2292 ml   Plan:  Clinimix E TPN at 40 mL/hr. This TPN provides 48 g of protein, 144 g of dextrose, and 60 g of lipids which provides 1141.6 kCals per day, meeting 57% of patient needs Electrolytes in TPN: none Add MVI, trace elements  Adding thiamine for 3 days. 07/15/2018 is day 1. Last day of thiamine is 07/17/2018 NS IVMF (D5,  NS, etc.) at 60 ml/hr Monitor TPN labs F/U 07/16/2018   Oswald Hillock, PharmD, BCPS 07/15/2018,12:17 PM

## 2018-07-15 NOTE — Progress Notes (Addendum)
Daily Progress Note   Patient Name: Bradley Dunlap       Date: 07/15/2018 DOB: [birth year]  Age: 63 y.o. MRN#: [id number] Attending Physician: Gorden Harms, MD Primary Care Physician: Ellene Route Admit Date: 07/08/2018  Reason for Consultation/Follow-up: Establishing goals of care  Subjective: Patient is resting in bed. He states he is passing gas now and is feeling better. He is eating ice chips.   We discussed his diagnosis, prognosis, and options. We discussed limitations of medical interventions to prolong quality of life for this patient at this time in this situation and discussed the concept of human mortality.  Upon discussing [place], he states he has not really had a chance to talk with his family, and that at this time, he wants all care possible. He states "I can say I would only want to be on the ventilator for a short time, but I can't tell you how long."  He tells me "I talked to one of the doctors, and they said I can have a procedure done that can make me live longer." He confirms full code status as well.   Of note, he states that he cannot read except for a few basic things.   Length of Stay: 7  Current Medications: Scheduled Meds:  . albuterol  2.5 mg Nebulization TID  . bisacodyl  10 mg Rectal QHS  . furosemide  40 mg Oral BID  . mometasone-formoterol  2 puff Inhalation BID  . nicotine  14 mg Transdermal Daily  . pantoprazole  40 mg Intravenous Q12H  . sodium phosphate  1 enema Rectal Daily  . spironolactone  100 mg Oral Daily  . tamsulosin  0.4 mg Oral QPC breakfast  . tiotropium  18 mcg Inhalation Daily    Continuous Infusions: . sodium chloride Stopped (07/12/18 0813)  . dextrose 5 % and 0.9% NaCl 100 mL/hr at 07/15/18 0532    PRN Meds: sodium  chloride, albuterol, alum & mag hydroxide-simeth, clonazePAM, HYDROmorphone (DILAUDID) injection, ondansetron **OR** ondansetron (ZOFRAN) IV, phenol, traMADol  Physical Exam Pulmonary:     Effort: Pulmonary effort is normal.  Neurological:     Mental Status: He is alert.     Comments: Oriented             Vital Signs: BP 99/79 (BP Location: Right  Arm)   Pulse 90   Temp 98.2 F (36.8 C) (Oral)   Resp 20   Ht 6\' 2"  (1.88 m)   Wt 98.9 kg   SpO2 93%   BMI 27.99 kg/m  SpO2: SpO2: 93 % O2 Device: O2 Device: Room Air O2 Flow Rate: O2 Flow Rate (L/min): 2 L/min  Intake/output summary:   Intake/Output Summary (Last 24 hours) at 07/15/2018 1052 Last data filed at 07/15/2018 0532 Gross per 24 hour  Intake 2390.39 ml  Output 1850 ml  Net 540.39 ml   LBM: Last BM Date: 07/12/18 Baseline Weight: Weight: 103.4 kg Most recent weight: Weight: 98.9 kg       Palliative Assessment/Data: NPO    Flowsheet Rows     Most Recent Value  Intake Tab  Referral Department  Hospitalist  Unit at Time of Referral  Med/Surg Unit  Palliative Care Primary Diagnosis  Cancer  Date Notified  07/13/18  Palliative Care Type  New Palliative care  Reason for referral  Clarify Goals of Care  Date of Admission  07/08/18  Date first seen by Palliative Care  07/13/18  # of days Palliative referral response time  0 Day(s)  # of days IP prior to Palliative referral  5  Clinical Assessment  Psychosocial & Spiritual Assessment  Palliative Care Outcomes      Patient Active Problem List   Diagnosis Date Noted  . SBO (small bowel obstruction) ([place])   . Cirrhosis of liver with ascites ([place])   . Hepatocellular carcinoma ([place]) 03/12/2017  . Alcoholic cirrhosis of liver with ascites ([place]) 01/27/2017  . Peritonitis ([place]) 01/13/2017  . Hematemesis 01/03/2017  . Ascites 12/24/2016  . Chronic viral hepatitis C ([place]) 07/02/2015  . Chronic pain syndrome 02/24/2013  . Neck pain 07/16/2005  . Chronic  obstructive pulmonary disease ([place]) 08/01/2003  . Anxiety state 07/27/2003    Palliative Care Assessment & Plan    Recommendations/Plan:  Recommend palliative to follow at D/C.    Code Status:    Code Status Orders  (From admission, onward)         Start     Ordered   07/08/18 2150  Full code  Continuous     07/08/18 2150        Code Status History    Date Active Date Inactive Code Status Order ID Comments User Context   01/13/2017 0632 01/14/2017 1829 Full Code [id number]  Saundra Shelling, MD Inpatient   01/03/2017 1746 01/05/2017 1547 Full Code [id number]  Gladstone Lighter, MD Inpatient   12/24/2016 1337 12/27/2016 1834 Full Code [id number]  Fritzi Mandes, MD Inpatient       Prognosis:   < 6 months    Care plan was discussed with primary team  Thank you for allowing the Palliative Medicine Team to assist in the care of this patient.   Total Time 25 min Prolonged Time Billed no      Greater than 50%  of this time was spent counseling and coordinating care related to the above assessment and plan.  Asencion Gowda, NP  Please contact Palliative Medicine Team phone at [phone] for questions and concerns.

## 2018-07-15 NOTE — Progress Notes (Signed)
CC: SBO Subjective: Feeling a bit better, no flatus. No abd pain.  ngt output decreasing AVSS Wbc nml  Objective: Vital signs in last 24 hours: Temp:  [98 F (36.7 C)-98.5 F (36.9 C)] 98.2 F (36.8 C) (03/20 0518) Pulse Rate:  [90-99] 90 (03/20 0518) Resp:  [19-20] 20 (03/20 0518) BP: (99-120)/(55-81) 99/79 (03/20 0518) SpO2:  [92 %-95 %] 93 % (03/20 0518) FiO2 (%):  [28 %] 28 % (03/19 1937) Weight:  [98.9 kg] 98.9 kg (03/20 0500) Last BM Date: 07/12/18  Intake/Output from previous day: 03/19 0701 - 03/20 0700 In: 2390.4 [I.V.:2290.4; IV Piggyback:100] Out: 6010 [Urine:1200; Emesis/NG output:650] Intake/Output this shift: No intake/output data recorded.  Physical exam: NAd, chronically ill Abd: soft, mildly distended, + ascitis. No peritonitis, non tender, reducible large UH Ext: well perfused and warm Neuro: awake and alert. Follows commands, GCS 15  Lab Results: CBC  Recent Labs    07/14/18 0324 07/15/18 0523  WBC 9.9 8.0  HGB 11.1* 10.9*  HCT 32.9* 31.7*  PLT 78* 78*   BMET Recent Labs    07/14/18 0324 07/15/18 0523  NA 132* 135  K 3.6 3.9  CL 96* 103  CO2 27 27  GLUCOSE 116* 113*  BUN 20 18  CREATININE 1.30* 1.11  CALCIUM 8.6* 8.5*   PT/INR Recent Labs    07/13/18 0852  LABPROT 18.5*  INR 1.6*   ABG No results for input(s): PHART, HCO3 in the last 72 hours.  Invalid input(s): PCO2, PO2  Studies/Results: Dg Abd 1 View  Result Date: 07/14/2018 CLINICAL DATA:  Follow-up examination for small bowel obstruction. EXAM: ABDOMEN - 1 VIEW COMPARISON:  Prior radiograph from 07/13/2018 FINDINGS: Enteric tube remains in place with tip overlying the proximal stomach, side hole in the distal esophagus. Persistent multiple dilated gas-filled loops of small bowel seen throughout the abdomen, compatible with ongoing small bowel obstruction. These measure up to approximately 6.5 cm in diameter. Paucity of gas distally. No appreciable free air on the supine  views of the abdomen. Left basilar subsegmental atelectasis noted. Postoperative changes noted within the lumbosacral spine. IMPRESSION: 1. Multiple persistent gas-filled dilated loops of small bowel throughout the abdomen, compatible with ongoing small bowel obstruction. 2. Tip of enteric tube in the proximal stomach, side hole in the distal esophagus. Electronically Signed   By: Jeannine Boga M.D.   On: 07/14/2018 06:37   Dg Abd 1 View  Result Date: 07/13/2018 CLINICAL DATA:  Nasogastric tube EXAM: ABDOMEN - 1 VIEW COMPARISON:  Abdominal CT from yesterday FINDINGS: Nasogastric tube with tip over the stomach and side-port over the GE junction. Dilated small bowel over the upper abdomen. Lung bases are clear. IMPRESSION: 1. Nasogastric tube tip is at the stomach. The side port is over the lower esophagus. 2. Ongoing bowel obstruction. Electronically Signed   By: Monte Fantasia M.D.   On: 07/13/2018 11:32    Anti-infectives: Anti-infectives (From admission, onward)   Start     Dose/Rate Route Frequency Ordered Stop   07/09/18 0700  cefTRIAXone (ROCEPHIN) 2 g in sodium chloride 0.9 % 100 mL IVPB     2 g 200 mL/hr over 30 Minutes Intravenous Every 24 hours 07/09/18 0651 07/15/18 0630      Assessment/Plan:  Partial SBO in the setting of advance Cirrhosis , child C and high Meld score. Portal HTN Continue NGT for now No surgical intervention May consider TPN depending on pt wishes Appreciate palliative care consuilt   Caroleen Hamman, MD, FACS  07/15/2018     

## 2018-07-15 NOTE — Progress Notes (Signed)
Physical Therapy Treatment Patient Details Name: Bradley Dunlap MRN: [id number] DOB: [birth year] Today's Date: 07/15/2018    History of Present Illness 63 yo male with onset of abd pain, nausea and cirrhosis with ascites is scheduled to have a paracentesis tomorrow, but currently noted bronchitis an cleared with chest x-ray.  PMHx:  EtOH abuse, anemia, ascites, anxiety, liver CA, Hep Dunlap, HTN, PVD, illiterate    PT Comments    Pt received in bed, agreeable to participate. Reports pain as similar to previous days, but strength in legs is improving. Distance limited to weakness in knees after extensive AMB. SpO2 92% p AMB trial. Foley cath re-anchored to thigh for improved support. RN arrives to disconnect patient's NG tube from wall suction. Pt left up in chair, all needs met. RN updated on status.   Follow Up Recommendations  Home health PT;Supervision for mobility/OOB     Equipment Recommendations  Rolling walker with 5" wheels    Recommendations for Other Services       Precautions / Restrictions Precautions Precautions: Fall Precaution Comments: monitor O2 sats Restrictions Weight Bearing Restrictions: No    Mobility  Bed Mobility Overal bed mobility: Modified Independent                Transfers Overall transfer level: Modified independent Equipment used: Rolling walker (2 wheeled)             General transfer comment: performed 3x from EOB; close supervision to mind lines/leads  Ambulation/Gait Ambulation/Gait assistance: Min guard Gait Distance (Feet): 180 Feet Assistive device: Rolling walker (2 wheeled) Gait Pattern/deviations: Step-through pattern;Decreased stride length;Trunk flexed     General Gait Details: pt takes 2 standing rest breaks    Stairs             Wheelchair Mobility    Modified Rankin (Stroke Patients Only)       Balance Overall balance assessment: Needs assistance Sitting-balance support: Feet supported Sitting  balance-Leahy Scale: Good     Standing balance support: Bilateral upper extremity supported Standing balance-Leahy Scale: Fair                              Cognition Arousal/Alertness: Awake/alert Behavior During Therapy: WFL for tasks assessed/performed Overall Cognitive Status: Within Functional Limits for tasks assessed                                        Exercises      General Comments        Pertinent Vitals/Pain Pain Assessment: Faces Faces Pain Scale: Hurts little more(pt does not rate when cued ) Pain Location: abdominal pain from fluid Pain Intervention(s): Limited activity within patient's tolerance;Monitored during session    Home Living                      Prior Function            PT Goals (current goals can now be found in the care plan section) Acute Rehab PT Goals Patient Stated Goal: to relieve pain PT Goal Formulation: With patient Time For Goal Achievement: 07/17/18 Potential to Achieve Goals: Good Progress towards PT goals: Progressing toward goals    Frequency    Min 2X/week      PT Plan Current plan remains appropriate    Co-evaluation  AM-PAC PT "6 Clicks" Mobility   Outcome Measure  Help needed turning from your back to your side while in a flat bed without using bedrails?: A Little Help needed moving from lying on your back to sitting on the side of a flat bed without using bedrails?: A Little Help needed moving to and from a bed to a chair (including a wheelchair)?: A Little Help needed standing up from a chair using your arms (e.g., wheelchair or bedside chair)?: A Little Help needed to walk in hospital room?: A Little Help needed climbing 3-5 steps with a railing? : A Little 6 Click Score: 18    End of Session   Activity Tolerance: Patient tolerated treatment well;Patient limited by fatigue Patient left: with call bell/phone within reach;in chair;with chair alarm  set;with family/visitor present Nurse Communication: Mobility status PT Visit Diagnosis: Unsteadiness on feet (R26.81);Other abnormalities of gait and mobility (R26.89)     Time: 3749-6646 PT Time Calculation (min) (ACUTE ONLY): 30 min  Charges:  $Therapeutic Exercise: 23-37 mins                     11:30 AM, 07/15/18 Bradley Dunlap, PT, DPT Physical Therapist - [facility]  [phone] ([place])     Bradley Dunlap,Bradley Dunlap 07/15/2018, 11:27 AM

## 2018-07-15 NOTE — Progress Notes (Addendum)
Nutrition Follow Up Note   DOCUMENTATION CODES:   Not applicable  INTERVENTION:    Initiate Clinimix 5/15 with electrolytes at 54ml/hr (Goal rate 68ml/hr once labs stable)  Initiate 20% lipids @25ml /hr x 12 hours per day    Regimen @ goal will provide 2014kcal/day, 100g/day protein, 2217ml volume    Add MVI daily and trace elements daily    Add IV thiamine 100mg  daily x 3 days   Pt at high refeeding risk; recommend check P, K, and Mg daily for 3 days after TPN iniatition.    Daily weights   Remove dextrose from IVF and keep total TPN + IVF rate at 165ml/hr  Check triglycerides prior to initiation of TPN    Monitor LFTs  NUTRITION DIAGNOSIS:   Increased nutrient needs related to chronic illness(cirrhosis, liver cancer, COPD) as evidenced by increased estimated needs.  GOAL:   Patient will meet greater than or equal to 90% of their needs  MONITOR:   Labs, Weight trends, Skin, I & O's  ASSESSMENT:   63 y.o. male presented to [facility] ED on 03/13 for evaluation of hematemesis. Pt with history of alcohol abuse, hepatitis C, liver cancer, liver cirrhosis with ascites. On 03/14 he underwent EGD ad was found to have bleeding esophageal varices which were banded. On 03/17, he was complaining of abdominal pain and had not had any bowel movements in a few days. CT concerning for small bowel obstruction.    Pt s/p paracentesis with 2.3L output on 3/16  Pt with continued abdominal pain; denies any flatus or BM. Abdomen remains distended. NGT in place with 627ml output. Pt with inadequate intake for 7 days; plan is for TPN today.   Medications reviewed and include: dulcolax, lasix, nicotine, protonix, aldactone, NaCl w/ 5% dextrose @100ml /hr  Labs reviewed: K 3.9 wnl P 2.8 wnl, Mg 2.4 wnl Hgb 10.9(L), Hct 31.7(L)  Diet Order:   Diet Order            Diet NPO time specified Except for: Sips with Meds, Ice Chips  Diet effective now             EDUCATION NEEDS:   Education  needs have been addressed  Skin:  Skin Assessment: Reviewed RN Assessment  Last BM:  3/17- type 6  Height:   Ht Readings from Last 1 Encounters:  07/08/18 6\' 2"  (1.88 m)    Weight:   Wt Readings from Last 1 Encounters:  07/15/18 98.9 kg    Ideal Body Weight:  86.3 kg  BMI:  Body mass index is 27.99 kg/m.  Estimated Nutritional Needs:   Kcal:  2300-2600kcal/day   Protein:  113-124g/day  Fluid:  2.1L/day or per MD  Koleen Distance MS, RD, LDN Pager #- [phone] Office#- [phone] After Hours Pager: [phone]

## 2018-07-15 NOTE — Progress Notes (Addendum)
[place] [facility] [place] NAME: Bradley Dunlap    MR#:  [id number]  DATE OF BIRTH:  07-30-1955  SUBJECTIVE:  CHIEF COMPLAINT:   Chief Complaint  Patient presents with  . Shortness of Breath  Patient continues to complain of abdominal pain, no flatus, small smear fecally per nursing staff yesterday, abdominal exam remains distended with tenderness, general surgery input appreciated, start TPN given prolonged illness   REVIEW OF SYSTEMS:  CONSTITUTIONAL: No fever, fatigue or weakness.  EYES: No blurred or double vision.  EARS, NOSE, AND THROAT: No tinnitus or ear pain.  RESPIRATORY: No cough, shortness of breath, wheezing or hemoptysis.  CARDIOVASCULAR: No chest pain, orthopnea, edema.  GASTROINTESTINAL: No nausea, vomiting, diarrhea or abdominal pain.  GENITOURINARY: No dysuria, hematuria.  ENDOCRINE: No polyuria, nocturia,  HEMATOLOGY: No anemia, easy bruising or bleeding SKIN: No rash or lesion. MUSCULOSKELETAL: No joint pain or arthritis.   NEUROLOGIC: No tingling, numbness, weakness.  PSYCHIATRY: No anxiety or depression.   ROS  DRUG ALLERGIES:   Allergies  Allergen Reactions  . Codeine Itching and Other (See Comments)    Other reaction(s): Other (See Comments) Back bone itching    . Indomethacin Itching    Other reaction(s): ITCHING    VITALS:  Blood pressure 99/79, pulse 90, temperature 98.2 F (36.8 C), temperature source Oral, resp. rate 20, height 6\' 2"  (1.88 m), weight 98.9 kg, SpO2 93 %.  PHYSICAL EXAMINATION:  GENERAL:  63 y.o.-year-old patient lying in the bed with no acute distress.  EYES: Pupils equal, round, reactive to light and accommodation. No scleral icterus. Extraocular muscles intact.  HEENT: Head atraumatic, normocephalic. Oropharynx and nasopharynx clear.  NECK:  Supple, no jugular venous distention. No thyroid enlargement, no tenderness.  LUNGS: Normal breath sounds bilaterally, no wheezing, rales,rhonchi  or crepitation. No use of accessory muscles of respiration.  CARDIOVASCULAR: S1, S2 normal. No murmurs, rubs, or gallops.  ABDOMEN: Soft, nontender, nondistended. Bowel sounds present. No organomegaly or mass.  EXTREMITIES: No pedal edema, cyanosis, or clubbing.  NEUROLOGIC: Cranial nerves II through XII are intact. Muscle strength 5/5 in all extremities. Sensation intact. Gait not checked.  PSYCHIATRIC: The patient is alert and oriented x 3.  SKIN: No obvious rash, lesion, or ulcer.   Physical Exam LABORATORY PANEL:   CBC Recent Labs  Lab 07/15/18 0523  WBC 8.0  HGB 10.9*  HCT 31.7*  PLT 78*   ------------------------------------------------------------------------------------------------------------------  Chemistries  Recent Labs  Lab 07/13/18 0834 07/14/18 0324 07/15/18 0523  NA 129* 132* 135  K 4.0 3.6 3.9  CL 94* 96* 103  CO2 27 27 27   GLUCOSE 121* 116* 113*  BUN 20 20 18   CREATININE 1.44* 1.30* 1.11  CALCIUM 8.9 8.6* 8.5*  MG  --  2.4  --   AST 88*  --   --   ALT 64*  --   --   ALKPHOS 58  --   --   BILITOT 3.8*  --   --    ------------------------------------------------------------------------------------------------------------------  Cardiac Enzymes No results for input(s): TROPONINI in the last 168 hours. ------------------------------------------------------------------------------------------------------------------  RADIOLOGY:  Dg Abd 1 View  Result Date: 07/14/2018 CLINICAL DATA:  Follow-up examination for small bowel obstruction. EXAM: ABDOMEN - 1 VIEW COMPARISON:  Prior radiograph from 07/13/2018 FINDINGS: Enteric tube remains in place with tip overlying the proximal stomach, side hole in the distal esophagus. Persistent multiple dilated gas-filled loops of small bowel seen throughout the abdomen, compatible with ongoing small  bowel obstruction. These measure up to approximately 6.5 cm in diameter. Paucity of gas distally. No appreciable free air  on the supine views of the abdomen. Left basilar subsegmental atelectasis noted. Postoperative changes noted within the lumbosacral spine. IMPRESSION: 1. Multiple persistent gas-filled dilated loops of small bowel throughout the abdomen, compatible with ongoing small bowel obstruction. 2. Tip of enteric tube in the proximal stomach, side hole in the distal esophagus. Electronically Signed   By: Jeannine Boga M.D.   On: 07/14/2018 06:37   Dg Abd 1 View  Result Date: 07/13/2018 CLINICAL DATA:  Nasogastric tube EXAM: ABDOMEN - 1 VIEW COMPARISON:  Abdominal CT from yesterday FINDINGS: Nasogastric tube with tip over the stomach and side-port over the GE junction. Dilated small bowel over the upper abdomen. Lung bases are clear. IMPRESSION: 1. Nasogastric tube tip is at the stomach. The side port is over the lower esophagus. 2. Ongoing bowel obstruction. Electronically Signed   By: Monte Fantasia M.D.   On: 07/13/2018 11:32    ASSESSMENT AND PLAN:  *Acute SBO with transition point  Stable Noted on CT abdomen  General surgery input appreciated -continue conservative medical management with NG tube for decompression/bowel rest/IV fluids for rehydration, start TPN, adult pain protocol   *Esophageal variceal bleed Stable S/P banding by gastroenterology/Dr. Allen Norris Continue Protonix, Octreotide discontinued, hemoglobin stable   *Chronic liver cirrhosis with ascites, hepatitis C, thrombocytopenia, elevated liver function test, hypercoagulable state Meld score 33 with 43-month mortality rate of 52% Palliative care following   Outpatient gastroenterology requested echocardiogram for further information as to whether patient may be a candidate for liver transplant in the future-echo noted for impaired left ventricular relaxation/normal ejection fraction  *COPD w/ wheeze Stable Patient states he always wheezes Continue breathing treatments PRN, Advair twice daily  *Chronic GAD, NOS  Stable   Continue anxiolytics   *Chronic tobacco smoker abuse/dependency  Cessation counseling ordered, nicotine patch daily   *Acute Left rib pain Improved X-ray negative for fracture  Case discussed with the patient's sister at length regarding poor prognosis, advanced directives were addressed as well given patient has somewhat mild mental disability/low IQ per sister  All the records are reviewed and case discussed with Care Management/Social Workerr. Management plans discussed with the patient, family and they are in agreement.  CODE STATUS: full  TOTAL TIME TAKING CARE OF THIS PATIENT: 35 minutes.     POSSIBLE D/C IN 2-3 DAYS, DEPENDING ON CLINICAL CONDITION.   Avel Peace  M.D on 07/15/2018   Between 7am to 6pm - Pager - [phone]  After 6pm go to [email or web address] - password [email or web address] [facility]  Office  [phone]  CC: Primary care physician; Ellene Route  Note: This dictation was prepared with Dragon dictation along with smaller phrase technology. Any transcriptional errors that result from this process are unintentional.

## 2018-07-16 ENCOUNTER — Inpatient Hospital Stay: Payer: Medicaid Other

## 2018-07-16 LAB — COMPREHENSIVE METABOLIC PANEL
ALT: 47 U/L — AB (ref 0–44)
AST: 68 U/L — ABNORMAL HIGH (ref 15–41)
Albumin: 2.7 g/dL — ABNORMAL LOW (ref 3.5–5.0)
Alkaline Phosphatase: 60 U/L (ref 38–126)
Anion gap: 8 (ref 5–15)
BUN: 18 mg/dL (ref 8–23)
CO2: 23 mmol/L (ref 22–32)
CREATININE: 1.06 mg/dL (ref 0.61–1.24)
Calcium: 8.6 mg/dL — ABNORMAL LOW (ref 8.9–10.3)
Chloride: 102 mmol/L (ref 98–111)
GFR calc non Af Amer: >60 mL/min (ref >60)
Glucose, Bld: 139 mg/dL — ABNORMAL HIGH (ref 70–99)
Potassium: 3.5 mmol/L (ref 3.5–5.1)
Sodium: 133 mmol/L — ABNORMAL LOW (ref 135–145)
Total Bilirubin: 3.2 mg/dL — ABNORMAL HIGH (ref 0.3–1.2)
Total Protein: 6.7 g/dL (ref 6.5–8.1)

## 2018-07-16 LAB — CBC
HCT: 31.2 % — ABNORMAL LOW (ref 39.0–52.0)
Hemoglobin: 10.3 g/dL — ABNORMAL LOW (ref 13.0–17.0)
MCH: 34.4 pg — ABNORMAL HIGH (ref 26.0–34.0)
MCHC: 33 g/dL (ref 30.0–36.0)
MCV: 104.3 fL — ABNORMAL HIGH (ref 80.0–100.0)
Platelets: 72 10*3/uL — ABNORMAL LOW (ref 150–400)
RBC: 2.99 MIL/uL — ABNORMAL LOW (ref 4.22–5.81)
RDW: 17.9 % — ABNORMAL HIGH (ref 11.5–15.5)
WBC: 8.4 10*3/uL (ref 4.0–10.5)
nRBC: 0 % (ref 0.0–0.2)

## 2018-07-16 LAB — GLUCOSE, CAPILLARY
GLUCOSE-CAPILLARY: 121 mg/dL — AB (ref 70–99)
Glucose-Capillary: 124 mg/dL — ABNORMAL HIGH (ref 70–99)
Glucose-Capillary: 134 mg/dL — ABNORMAL HIGH (ref 70–99)
Glucose-Capillary: 111 mg/dL — ABNORMAL HIGH (ref 70–99)

## 2018-07-16 LAB — MAGNESIUM: Magnesium: 2 mg/dL (ref 1.7–2.4)

## 2018-07-16 LAB — DIFFERENTIAL
Abs Immature Granulocytes: 0.06 10*3/uL (ref 0.00–0.07)
Basophils Absolute: 0 10*3/uL (ref 0.0–0.1)
Basophils Relative: 0 %
Eosinophils Absolute: 0.2 10*3/uL (ref 0.0–0.5)
Eosinophils Relative: 2 %
Immature Granulocytes: 1 %
Lymphocytes Relative: 7 %
Lymphs Abs: 0.6 10*3/uL — ABNORMAL LOW (ref 0.7–4.0)
Monocytes Absolute: 1.1 10*3/uL — ABNORMAL HIGH (ref 0.1–1.0)
Monocytes Relative: 13 %
Neutro Abs: 6.5 10*3/uL (ref 1.7–7.7)
Neutrophils Relative %: 77 %

## 2018-07-16 LAB — PREALBUMIN: Prealbumin: <5 mg/dL — ABNORMAL LOW (ref 18–38)

## 2018-07-16 LAB — PROTIME-INR
INR: 1.5 — ABNORMAL HIGH (ref 0.8–1.2)
Prothrombin Time: 17.8 seconds — ABNORMAL HIGH (ref 11.4–15.2)

## 2018-07-16 LAB — TRIGLYCERIDES: TRIGLYCERIDES: 203 mg/dL — AB (ref <150)

## 2018-07-16 LAB — PHOSPHORUS: Phosphorus: 2.1 mg/dL — ABNORMAL LOW (ref 2.5–4.6)

## 2018-07-16 MED ORDER — INSULIN ASPART 100 UNIT/ML ~~LOC~~ SOLN: 0.0000 [IU] | SUBCUTANEOUS | Status: DC

## 2018-07-16 MED ORDER — INSULIN ASPART 100 UNIT/ML ~~LOC~~ SOLN
0.0000 [IU] | Freq: Four times a day (QID) | SUBCUTANEOUS | Status: DC
  Administered 2018-07-16 – 2018-07-18 (×6): 1 [IU] via SUBCUTANEOUS
  Filled 2018-07-16 (×5): qty 1

## 2018-07-16 MED ORDER — IPRATROPIUM-ALBUTEROL 0.5-2.5 (3) MG/3ML IN SOLN
3.0000 mL | Freq: Three times a day (TID) | RESPIRATORY_TRACT | Status: DC
  Administered 2018-07-16 – 2018-07-18 (×6): 3 mL via RESPIRATORY_TRACT
  Filled 2018-07-16 (×6): qty 3

## 2018-07-16 MED ORDER — POTASSIUM PHOSPHATES 15 MMOLE/5ML IV SOLN
20.0000 mmol | Freq: Once | INTRAVENOUS | Status: AC
  Administered 2018-07-16: 20 mmol via INTRAVENOUS
  Filled 2018-07-16: qty 6.67

## 2018-07-16 MED ORDER — FAT EMULSION PLANT BASED 20 % IV EMUL
300.0000 mL | INTRAVENOUS | Status: AC
  Administered 2018-07-16: 300 mL via INTRAVENOUS
  Filled 2018-07-16: qty 300

## 2018-07-16 MED ORDER — TRACE MINERALS CR-CU-MN-SE-ZN 10-1000-500-60 MCG/ML IV SOLN
INTRAVENOUS | Status: AC
  Administered 2018-07-16: 18:00:00 via INTRAVENOUS
  Filled 2018-07-16: qty 1992

## 2018-07-16 NOTE — Progress Notes (Signed)
Patient's NG tube coming out- able to replace/ reposition and re-tape- Dr. Estanislado Pandy made aware and new X-ray obtained for confirmation of placement. AM Medications held at this time.

## 2018-07-16 NOTE — Consult Note (Signed)
PHARMACY - ADULT TOTAL PARENTERAL NUTRITION CONSULT NOTE   Pharmacy Consult for TPN initation due to small bowel obstruction   Patient Measurements: Height: 6\' 2"  (188 cm) Weight: 218 lb 0.6 oz (98.9 kg) IBW/kg (Calculated) : 82.2 TPN AdjBW (KG): 87.5 Body mass index is 27.99 kg/m. Usual Weight: 98.9 kg IBW 86.3 kg  Assessment:   63 y.o. male presented to [facility] ED on 03/13 for evaluation of hematemesis. Pt with history of alcohol abuse, hepatitis C, liver cancer, liver cirrhosis with ascites. On 03/14 he underwent EGD ad was found to have bleeding esophageal varices which were banded. On 03/17, he was complaining of abdominal pain and had not had any bowel movements in a few days. CT concerning for small bowel obstruction. Pt with continued abdominal pain; denies any flatus or BM. Abdomen remains distended. NGT in place with 638ml output. Pt with inadequate intake for 7 days; plan is for TPN today.   GI:  No nausea, vomiting, diarrhea or abdominal pain Endo: No polyuria, nocturia Insulin requirements in the past 24 hours: none SSI q6h ordered Lytes: K 3.5, Mg 2.0, Phos 2.1 scr 1.06 Renal: Scr trending down.  Pulm: on room air Cards: No chest pain, orthopnea, edema Hepatobil:Chronic liver cirrhosis with ascites, hepatitis C, thrombocytopenia, elevated liver function test, hypercoagulable state Neuro: No tingling, numbness, weakness.   TPN Access: PICC to be placed  TPN start date:07/15/2018 Nutritional Goals (per RD recommendation on 07/15/2018): KCal: 2300-2600 kcal/day Protein:113-124g/day Fluid: 2.1L/day  Goal TPN rate is 83 ml/hr KCal: 2014 kcal/day Protein: 100g/day Fluid: 2292 ml   Plan:  Will increase Clinimix E 5/15 TPN to goal rate of 83 ml/hr. Will continue 20% lipids @25  ml/hr x 12 hrs/day. TG 203. This TPN provides 48 g of protein, 144 g of dextrose, and 60 g of lipids which provides 1141.6 kCals per day, meeting 57% of patient needs Electrolytes in TPN: none Add  MVI, trace elements  Adding thiamine for 3 days. 07/15/2018 is day 1. Last day of thiamine is 07/17/2018 NS IVMF (D5, NS, etc.) wil be reduced to 17 ml/hr for a total IV volume of 161ml/hr (TPN and IVF) Will order Potassium phophate 20 mmol IV x 1 for Phos 2.1 Monitor TPN labs F/U 07/16/2018   Tjuana Vickrey A, PharmD, BCPS 07/16/2018,9:52 AM

## 2018-07-16 NOTE — Progress Notes (Signed)
Pnt remains very uncomfortable. Dilaudid given x's 3 overnight, denies nausea. Abdomen distended, feels fluid filled from palpation. Gave suppository however pnt was only able to pass twice a small amount of liquid with some small particles of stool.   NGT in place with 572ml's of output between 11pm-6a this am, remains at [facility].

## 2018-07-16 NOTE — Progress Notes (Signed)
Patient seen by Hospitalist and Surgeon today, Patient supported in processing prognosis given by MD. Support provided to both patient and family. Patient made strict NPO, minimal hypoactive bowel sounds- MD aware of abdominal distention and minimal output- moisture swabs provided to patient. Patient attempted bowel movement on bedside commode, unable to have bowel movement. Refused enema this shift- states the may decide to receive enema later on this evening. NG tube displaced this morning, MD, Pyreddy, aware- replaced and X-ray ordered to verify placement. Patient medicated for pain as tolerated.

## 2018-07-16 NOTE — Progress Notes (Addendum)
[place] at [place] NAME: Bradley Dunlap    MR#:  [id number]  DATE OF BIRTH:  16-Jan-1956  SUBJECTIVE:  CHIEF COMPLAINT:   Chief Complaint  Patient presents with  . Shortness of Breath  Patient has abdominal discomfort No bowel movement yet Has NG tube with suction Abdominal distention  REVIEW OF SYSTEMS:  CONSTITUTIONAL: No fever, fatigue or weakness.  EYES: No blurred or double vision.  EARS, NOSE, AND THROAT: No tinnitus or ear pain.  RESPIRATORY: No cough, shortness of breath, wheezing or hemoptysis.  CARDIOVASCULAR: No chest pain, orthopnea, edema.  GASTROINTESTINAL: No nausea, vomiting, diarrhea  has abdominal pain.  GENITOURINARY: No dysuria, hematuria.  ENDOCRINE: No polyuria, nocturia,  HEMATOLOGY: No anemia, easy bruising or bleeding SKIN: No rash or lesion. MUSCULOSKELETAL: No joint pain or arthritis.   NEUROLOGIC: No tingling, numbness, weakness.  PSYCHIATRY: No anxiety or depression.   ROS  DRUG ALLERGIES:   Allergies  Allergen Reactions  . Codeine Itching and Other (See Comments)    Other reaction(s): Other (See Comments) Back bone itching    . Indomethacin Itching    Other reaction(s): ITCHING    VITALS:  Blood pressure 121/74, pulse 93, temperature 98.2 F (36.8 C), temperature source Oral, resp. rate 16, height 6\' 2"  (1.88 m), weight 98.9 kg, SpO2 92 %.  PHYSICAL EXAMINATION:  GENERAL:  64 y.o.-year-old patient lying in the bed with no acute distress.  EYES: Pupils equal, round, reactive to light and accommodation. No scleral icterus. Extraocular muscles intact.  HEENT: Head atraumatic, normocephalic. Oropharynx and nasopharynx clear.  NECK:  Supple, no jugular venous distention. No thyroid enlargement, no tenderness.  LUNGS: Normal breath sounds bilaterally, no wheezing, rales,rhonchi or crepitation. No use of accessory muscles of respiration.  CARDIOVASCULAR: S1, S2 normal. No murmurs, rubs, or gallops.   ABDOMEN: Soft, tenderness around umbilicus , distended. Bowel sounds absent. No organomegaly or mass.  EXTREMITIES: No pedal edema, cyanosis, or clubbing.  NEUROLOGIC: Cranial nerves II through XII are intact. Muscle strength 5/5 in all extremities. Sensation intact. Gait not checked.  PSYCHIATRIC: The patient is alert and oriented x 3.  SKIN: No obvious rash, lesion, or ulcer.   Physical Exam LABORATORY PANEL:   CBC Recent Labs  Lab 07/16/18 0422  WBC 8.4  HGB 10.3*  HCT 31.2*  PLT 72*   ------------------------------------------------------------------------------------------------------------------  Chemistries  Recent Labs  Lab 07/16/18 0422  NA 133*  K 3.5  CL 102  CO2 23  GLUCOSE 139*  BUN 18  CREATININE 1.06  CALCIUM 8.6*  MG 2.0  AST 68*  ALT 47*  ALKPHOS 60  BILITOT 3.2*   ------------------------------------------------------------------------------------------------------------------  Cardiac Enzymes No results for input(s): TROPONINI in the last 168 hours. ------------------------------------------------------------------------------------------------------------------  RADIOLOGY:  Dg Abd Portable 2v  Result Date: 07/16/2018 CLINICAL DATA:  Small-bowel obstruction EXAM: PORTABLE ABDOMEN - 2 VIEW COMPARISON:  07/14/2018 FINDINGS: NG in the stomach. Side hole now in the stomach. Moderate to severely dilated small bowel loops appear unchanged. Colon remains decompressed. No free air. IMPRESSION: Small-bowel obstruction pattern unchanged. NG advanced into the stomach. Electronically Signed   By: Franchot Gallo M.D.   On: 07/16/2018 08:06   [place] Ekg Site Rite  Result Date: 07/15/2018 If Site Rite image not attached, placement could not be confirmed due to current cardiac rhythm.   ASSESSMENT AND PLAN:  *Acute SBO with transition point  Noted on CT abdomen  On TPN for nutrition Currently on conservative management with NG  tube to suction for  decompression of the bowel IV fluids Surgery follow-up today Follow-up abdominal x-ray High risk of morbidity and mortality Discussed management plan with patient and patient's sister in detail  *Esophageal variceal bleed Stable S/P banding by gastroenterology/Dr. Allen Norris Continue Protonix, Octreotide discontinued, hemoglobin stable   *Chronic liver cirrhosis with ascites, hepatitis C, thrombocytopenia, elevated liver function test, hypercoagulable state Meld score 33 with 34-month mortality rate of 52% Palliative care following   Outpatient gastroenterology requested echocardiogram for further information as to whether patient may be a candidate for liver transplant in the future Echo noted for impaired left ventricular relaxation/normal ejection fraction  *COPD w/ wheeze Stable Continue breathing treatments PRN, Advair twice daily  *Chronic GAD, NOS  Stable  Continue anxiolytics   *Chronic tobacco smoker abuse/dependency  Cessation counseling ordered, nicotine patch daily   Case discussed with the patient's sister at length regarding poor prognosis, advanced directives were addressed as well given patient has somewhat mild mental disability/low IQ per sister  All the records are reviewed and case discussed with Care Management/Social Workerr. Management plans discussed with the patient, family and they are in agreement.  CODE STATUS: full  TOTAL TIME TAKING CARE OF THIS PATIENT: 37 minutes.     POSSIBLE D/C IN 2-3 DAYS, DEPENDING ON CLINICAL CONDITION.   Saundra Shelling M.D on 07/16/2018   Between 7am to 6pm - Pager - [phone]  After 6pm go to [email or web address] - password [email or web address] [facility]  Office  [phone]  CC: Primary care physician; Ellene Route  Note: This dictation was prepared with Dragon dictation along with smaller phrase technology. Any transcriptional errors that result from this process are unintentional.

## 2018-07-16 NOTE — Progress Notes (Addendum)
SURGICAL PROGRESS NOTE (cpt [phone])  Hospital Day(s): 8.   Post op day(s): 7 Days Post-Op.   Interval History: Patient seen and examined, no acute events or new complaints overnight. Patient continues to deny significant abdominal pain, though likewise continues to deny flatus without N/V, fever/chills, CP, or SOB.  Review of Systems:  Constitutional: denies fever, chills  HEENT: denies cough or congestion  Respiratory: denies any shortness of breath  Cardiovascular: denies chest pain or palpitations  Gastrointestinal: abdominal pain, N/V, and bowel function as per interval history Genitourinary: denies burning with urination or urinary frequency Musculoskeletal: denies pain, decreased motor or sensation Integumentary: denies any other rashes or skin discolorations Neurological: denies HA or vision/hearing changes   Vital signs in last 24 hours: [min-max] current  Temp:  [98.3 F (36.8 C)-98.4 F (36.9 C)] 98.3 F (36.8 C) (03/21 0557) Pulse Rate:  [90-111] 90 (03/21 0739) Resp:  [16-20] 18 (03/21 0739) BP: (101-109)/(62-67) 109/67 (03/21 0557) SpO2:  [90 %-92 %] 90 % (03/21 0739)     Height: 6\' 2"  (188 cm) Weight: 98.9 kg BMI (Calculated): 27.98   Intake/Output this shift:  No intake/output data recorded.   Intake/Output last 2 shifts:  @IOLAST2SHIFTS @   Physical Exam:  Constitutional: alert, cooperative and no distress  HENT: normocephalic without obvious abnormality  Eyes: PERRL, EOM's grossly intact and symmetric  Respiratory: breathing non-labored at rest  Cardiovascular: regular rate and sinus rhythm  Gastrointestinal: abdomen distended with ascites, tympany to percussion, though only mildly tender to palpation Musculoskeletal: UE and LE FROM, motor and sensation grossly intact, NT   Labs:  CBC Latest Ref Rng & Units 07/16/2018 07/15/2018 07/14/2018  WBC 4.0 - 10.5 K/uL 8.4 8.0 9.9  Hemoglobin 13.0 - 17.0 g/dL 10.3(L) 10.9(L) 11.1(L)  Hematocrit 39.0 - 52.0 %  31.2(L) 31.7(L) 32.9(L)  Platelets 150 - 400 K/uL 72(L) 78(L) 78(L)   CMP Latest Ref Rng & Units 07/16/2018 07/15/2018 07/14/2018  Glucose 70 - 99 mg/dL 139(H) 113(H) 116(H)  BUN 8 - 23 mg/dL 18 18 20   Creatinine 0.61 - 1.24 mg/dL 1.06 1.11 1.30(H)  Sodium 135 - 145 mmol/L 133(L) 135 132(L)  Potassium 3.5 - 5.1 mmol/L 3.5 3.9 3.6  Chloride 98 - 111 mmol/L 102 103 96(L)  CO2 22 - 32 mmol/L 23 27 27   Calcium 8.9 - 10.3 mg/dL 8.6(L) 8.5(L) 8.6(L)  Total Protein 6.5 - 8.1 g/dL 6.7 - -  Total Bilirubin 0.3 - 1.2 mg/dL 3.2(H) - -  Alkaline Phos 38 - 126 U/L 60 - -  AST 15 - 41 U/L 68(H) - -  ALT 0 - 44 U/L 47(H) - -   Imaging studies:  Abdominal X-ray (07/16/2018) NG tip in the body the stomach. Diffuse small bowel dilatation unchanged.  Assessment/Plan: (ICD-10's: K28.609) 63 y.o. male with essentially unchanged SBO in context of advanced hepatic cirrhosis s/p recent paracentesis without prior abdominal surgical history, complicated by recently bleeding esophageal varices and by pertinent comorbidities including alcohol abuse, hepatitis C, liver cancer, liver cirrhosis with ascites, HTN, history anemia, and obesity.   - continue nasogastric decompression  - monitor abdominal exam and bowel function  - palliative care consultation appreciated, may require hospice  - patient remains a very poor candidate for surgery due to ~82% peri-operative mortality  - no indication for emergent surgical intervention at this time, consider TPN  - transfer to tertiary facility may be considered, but options limited  - medical management as per primary medical team  - DVT prophylaxis, ambulation  encouraged  All of the above findings and recommendations were discussed with the patient, patient's family, and the medical team, and all of patient's and family's questions were answered to their expressed satisfaction.  Thank you for the opportunity to participate in this patient's care.  -- Marilynne Drivers Rosana Hoes,  MD, [place]: [facility] - Partnering for exceptional care. Office: [phone]

## 2018-07-17 ENCOUNTER — Inpatient Hospital Stay: Payer: Medicaid Other

## 2018-07-17 LAB — COMPREHENSIVE METABOLIC PANEL
ALT: 40 U/L (ref 0–44)
AST: 54 U/L — AB (ref 15–41)
Albumin: 2.4 g/dL — ABNORMAL LOW (ref 3.5–5.0)
Alkaline Phosphatase: 57 U/L (ref 38–126)
Anion gap: 6 (ref 5–15)
BUN: 20 mg/dL (ref 8–23)
CO2: 25 mmol/L (ref 22–32)
Calcium: 8.5 mg/dL — ABNORMAL LOW (ref 8.9–10.3)
Chloride: 102 mmol/L (ref 98–111)
Creatinine, Ser: 0.84 mg/dL (ref 0.61–1.24)
GFR calc Af Amer: >60 mL/min (ref >60)
GFR calc non Af Amer: >60 mL/min (ref >60)
Glucose, Bld: 129 mg/dL — ABNORMAL HIGH (ref 70–99)
POTASSIUM: 3.8 mmol/L (ref 3.5–5.1)
Sodium: 133 mmol/L — ABNORMAL LOW (ref 135–145)
Total Bilirubin: 2.8 mg/dL — ABNORMAL HIGH (ref 0.3–1.2)
Total Protein: 6.4 g/dL — ABNORMAL LOW (ref 6.5–8.1)

## 2018-07-17 LAB — MAGNESIUM: Magnesium: 2.2 mg/dL (ref 1.7–2.4)

## 2018-07-17 LAB — GLUCOSE, CAPILLARY
Glucose-Capillary: 111 mg/dL — ABNORMAL HIGH (ref 70–99)
Glucose-Capillary: 130 mg/dL — ABNORMAL HIGH (ref 70–99)
Glucose-Capillary: 131 mg/dL — ABNORMAL HIGH (ref 70–99)
Glucose-Capillary: 136 mg/dL — ABNORMAL HIGH (ref 70–99)

## 2018-07-17 LAB — PHOSPHORUS: Phosphorus: 2.3 mg/dL — ABNORMAL LOW (ref 2.5–4.6)

## 2018-07-17 MED ORDER — FAT EMULSION PLANT BASED 20 % IV EMUL
300.0000 mL | INTRAVENOUS | Status: AC
  Administered 2018-07-17: 300 mL via INTRAVENOUS
  Filled 2018-07-17: qty 300

## 2018-07-17 MED ORDER — FUROSEMIDE 10 MG/ML IJ SOLN
20.0000 mg | Freq: Two times a day (BID) | INTRAMUSCULAR | Status: DC
  Administered 2018-07-17 – 2018-07-18 (×2): 20 mg via INTRAVENOUS
  Filled 2018-07-17 (×2): qty 4

## 2018-07-17 MED ORDER — SODIUM PHOSPHATES 45 MMOLE/15ML IV SOLN
20.0000 mmol | Freq: Once | INTRAVENOUS | Status: AC
  Administered 2018-07-17: 20 mmol via INTRAVENOUS
  Filled 2018-07-17: qty 6.67

## 2018-07-17 MED ORDER — TRACE MINERALS CR-CU-MN-SE-ZN 10-1000-500-60 MCG/ML IV SOLN
INTRAVENOUS | Status: DC
  Administered 2018-07-17: 18:00:00 via INTRAVENOUS
  Filled 2018-07-17: qty 1992

## 2018-07-17 NOTE — Progress Notes (Signed)
Can the patient have any ice chips at all. He also is NPO as he has and NG tube to suction. He has oral medications to be administer do you want me to give them threw the NG tube or hold them they are (flomax, Spirolactone).

## 2018-07-17 NOTE — Progress Notes (Addendum)
[place] at [place] NAME: Bradley Dunlap    MR#:  [id number]  DATE OF BIRTH:  December 02, 1955  SUBJECTIVE:  CHIEF COMPLAINT:   Chief Complaint  Patient presents with  . Shortness of Breath  Patient has mild abdominal discomfort No bowel movement yet Has NG tube with low intermittent suction Abdominal distention  REVIEW OF SYSTEMS:  CONSTITUTIONAL: No fever, fatigue or weakness.  EYES: No blurred or double vision.  EARS, NOSE, AND THROAT: No tinnitus or ear pain.  RESPIRATORY: No cough, shortness of breath, wheezing or hemoptysis.  CARDIOVASCULAR: No chest pain, orthopnea, edema.  GASTROINTESTINAL: No nausea, vomiting, diarrhea  has abdominal pain.  GENITOURINARY: No dysuria, hematuria.  ENDOCRINE: No polyuria, nocturia,  HEMATOLOGY: No anemia, easy bruising or bleeding SKIN: No rash or lesion. MUSCULOSKELETAL: No joint pain or arthritis.   NEUROLOGIC: No tingling, numbness, weakness.  PSYCHIATRY: No anxiety or depression.   ROS  DRUG ALLERGIES:   Allergies  Allergen Reactions  . Codeine Itching and Other (See Comments)    Other reaction(s): Other (See Comments) Back bone itching    . Indomethacin Itching    Other reaction(s): ITCHING    VITALS:  Blood pressure 111/76, pulse 98, temperature 98.1 F (36.7 C), temperature source Oral, resp. rate 20, height 6\' 2"  (1.88 m), weight 98.9 kg, SpO2 93 %.  PHYSICAL EXAMINATION:  GENERAL:  63 y.o.-year-old patient lying in the bed with no acute distress.  EYES: Pupils equal, round, reactive to light and accommodation. No scleral icterus. Extraocular muscles intact.  HEENT: Head atraumatic, normocephalic. Oropharynx and nasopharynx clear.  NECK:  Supple, no jugular venous distention. No thyroid enlargement, no tenderness.  LUNGS: Normal breath sounds bilaterally, no wheezing, rales,rhonchi or crepitation. No use of accessory muscles of respiration.  CARDIOVASCULAR: S1, S2 normal. No  murmurs, rubs, or gallops.  ABDOMEN: Soft, tenderness around umbilicus , distended. Bowel sounds absent. No organomegaly or mass.  EXTREMITIES: No pedal edema, cyanosis, or clubbing.  NEUROLOGIC: Cranial nerves II through XII are intact. Muscle strength 5/5 in all extremities. Sensation intact. Gait not checked.  PSYCHIATRIC: The patient is alert and oriented x 3.  SKIN: No obvious rash, lesion, or ulcer.   Physical Exam LABORATORY PANEL:   CBC Recent Labs  Lab 07/16/18 0422  WBC 8.4  HGB 10.3*  HCT 31.2*  PLT 72*   ------------------------------------------------------------------------------------------------------------------  Chemistries  Recent Labs  Lab 07/17/18 0622  NA 133*  K 3.8  CL 102  CO2 25  GLUCOSE 129*  BUN 20  CREATININE 0.84  CALCIUM 8.5*  MG 2.2  AST 54*  ALT 40  ALKPHOS 57  BILITOT 2.8*   ------------------------------------------------------------------------------------------------------------------  Cardiac Enzymes No results for input(s): TROPONINI in the last 168 hours. ------------------------------------------------------------------------------------------------------------------  RADIOLOGY:  Dg Abd 1 View  Result Date: 07/17/2018 CLINICAL DATA:  Abdominal distension.  NG tube placement yesterday. EXAM: ABDOMEN - 1 VIEW COMPARISON:  07/16/2018 and 07/14/2018 FINDINGS: Nasogastric tube is unchanged with tip within the stomach over the midline upper abdomen. No change in multiple air-filled dilated small bowel loops. No free peritoneal air. Remainder of the exam is unchanged. IMPRESSION: No significant change in multiple air-filled dilated small bowel loops compatible with small bowel obstruction. Nasogastric tube unchanged with tip over the stomach in the midline upper abdomen. Electronically Signed   By: Marin Olp M.D.   On: 07/17/2018 07:43   Dg Abd 1 View  Result Date: 07/16/2018 CLINICAL DATA:  NG tube placement EXAM:  ABDOMEN - 1  VIEW COMPARISON:  07/16/2018 FINDINGS: NG tube is been advanced with the tip now in the body of the stomach. Dilated small bowel loops unchanged. IMPRESSION: NG tip in the body the stomach. Diffuse small bowel dilatation unchanged. Electronically Signed   By: Franchot Gallo M.D.   On: 07/16/2018 11:12   Dg Abd Portable 2v  Result Date: 07/16/2018 CLINICAL DATA:  Small-bowel obstruction EXAM: PORTABLE ABDOMEN - 2 VIEW COMPARISON:  07/14/2018 FINDINGS: NG in the stomach. Side hole now in the stomach. Moderate to severely dilated small bowel loops appear unchanged. Colon remains decompressed. No free air. IMPRESSION: Small-bowel obstruction pattern unchanged. NG advanced into the stomach. Electronically Signed   By: Franchot Gallo M.D.   On: 07/16/2018 08:06   [place] Ekg Site Rite  Result Date: 07/15/2018 If Site Rite image not attached, placement could not be confirmed due to current cardiac rhythm.   ASSESSMENT AND PLAN:  *Acute Small Bowel Obstruction Noted on CT abdomen  On TPN for nutrition Currently on conservative management with NG tube to suction for decompression of the bowel IV fluids Surgery follow-up for intervention  Follow-up abdominal x-ray High risk of morbidity and mortality in view of advanced liver disease. Surgery recommending conservative mgmt for now with NGT and suction. Discussed management plan with patient and patient's brother in detail  *Esophageal variceal bleed Stable S/P banding by gastroenterology/Dr. Allen Norris Continue Protonix, Octreotide discontinued, hemoglobin stable   *Chronic liver cirrhosis with ascites, hepatitis C, thrombocytopenia, elevated liver function test, hypercoagulable state Meld score 33 with 14-month mortality rate of 52% Palliative care following   Outpatient gastroenterology requested echocardiogram for further information as to whether patient may be a candidate for liver transplant in the future Echo noted for impaired left ventricular  relaxation/normal ejection fraction  *COPD w/ wheeze Stable Continue breathing treatments PRN, Advair twice daily  *Chronic GAD, NOS  Stable  Continue anxiolytics   *Chronic tobacco smoker abuse/dependency  Cessation counseling ordered, nicotine patch daily   *Case discussed with the patient's brother at length regarding poor prognosis, advanced directives were addressed as well given patient has somewhat mild mental disability/low IQ .For now they want full resuscitation if need arises.  *Overall prognosis very poor  All the records are reviewed and case discussed with Care Management/Social Workerr. Management plans discussed with the patient, family and they are in agreement.  CODE STATUS: full  TOTAL TIME TAKING CARE OF THIS PATIENT: 38 minutes.  POSSIBLE D/C IN 2-3 DAYS, DEPENDING ON CLINICAL CONDITION.   Saundra Shelling M.D on 07/17/2018   Between 7am to 6pm - Pager - [phone]  After 6pm go to [email or web address] - password [email or web address] [place] [facility]  Office  [phone]  CC: Primary care physician; Ellene Route  Note: This dictation was prepared with Dragon dictation along with smaller phrase technology. Any transcriptional errors that result from this process are unintentional.

## 2018-07-17 NOTE — Progress Notes (Signed)
MD notified and surgeon:  The patient started to complain of additional abdominal pain. NG tube output is only 229ml since 7Am. His abdomen is very distended.  Dr. Estanislado Pandy wanted me to touch base with you as well. He said  his pain is 10/10 in his L side of his abdomen. He is very pale. BP 105/50, HR 105, o2 93%, temp 98.88F.

## 2018-07-17 NOTE — Progress Notes (Addendum)
SURGICAL PROGRESS NOTE (cpt [phone])  Hospital Day(s): 9.   Post op day(s): 8 Days Post-Op.   Interval History: Patient seen and examined, no acute events or new complaints overnight. Patient reports his mild lower abdominal pain is unchanged. He describes he passed flatus once or twice since yesterday and denies nausea, fever/chills, CP, or SOB.  Review of Systems:  Constitutional: denies fever, chills  HEENT: denies cough or congestion  Respiratory: denies any shortness of breath  Cardiovascular: denies chest pain or palpitations  Gastrointestinal: abdominal pain, N/V, and bowel function as per interval history Genitourinary: denies burning with urination or urinary frequency Musculoskeletal: denies pain, decreased motor or sensation Integumentary: denies any other rashes or skin discolorations Neurological: denies HA or vision/hearing changes   Vital signs in last 24 hours: [min-max] current  Temp:  [98.1 F (36.7 C)-98.3 F (36.8 C)] 98.1 F (36.7 C) (03/21 2002) Pulse Rate:  [93-99] 98 (03/21 2002) Resp:  [16-20] 20 (03/21 2002) BP: (93-121)/(58-76) 111/76 (03/21 2002) SpO2:  [90 %-93 %] 93 % (03/21 2008) FiO2 (%):  [21 %] 21 % (03/21 2008)     Height: 6\' 2"  (188 cm) Weight: 98.9 kg BMI (Calculated): 27.98   Intake/Output this shift:  No intake/output data recorded.   Intake/Output last 2 shifts:  @IOLAST2SHIFTS @   Physical Exam:  Constitutional: alert, cooperative and no distress, +jaundice HENT: normocephalic without obvious abnormality  Eyes: PERRL, EOM's grossly intact and symmetric  Respiratory: breathing non-labored at rest  Cardiovascular: regular rate and sinus rhythm  Gastrointestinal: abdomen distended with +ascites, also tympany to percussion, though only mildly tender to palpation Musculoskeletal: UE and LE FROM, motor and sensation grossly intact, NT   Labs:  CBC Latest Ref Rng & Units 07/16/2018 07/15/2018 07/14/2018  WBC 4.0 - 10.5 K/uL 8.4 8.0 9.9   Hemoglobin 13.0 - 17.0 g/dL 10.3(L) 10.9(L) 11.1(L)  Hematocrit 39.0 - 52.0 % 31.2(L) 31.7(L) 32.9(L)  Platelets 150 - 400 K/uL 72(L) 78(L) 78(L)   CMP Latest Ref Rng & Units 07/17/2018 07/16/2018 07/15/2018  Glucose 70 - 99 mg/dL 129(H) 139(H) 113(H)  BUN 8 - 23 mg/dL 20 18 18   Creatinine 0.61 - 1.24 mg/dL 0.84 1.06 1.11  Sodium 135 - 145 mmol/L 133(L) 133(L) 135  Potassium 3.5 - 5.1 mmol/L 3.8 3.5 3.9  Chloride 98 - 111 mmol/L 102 102 103  CO2 22 - 32 mmol/L 25 23 27   Calcium 8.9 - 10.3 mg/dL 8.5(L) 8.6(L) 8.5(L)  Total Protein 6.5 - 8.1 g/dL 6.4(L) 6.7 -  Total Bilirubin 0.3 - 1.2 mg/dL 2.8(H) 3.2(H) -  Alkaline Phos 38 - 126 U/L 57 60 -  AST 15 - 41 U/L 54(H) 68(H) -  ALT 0 - 44 U/L 40 47(H) -    Assessment/Plan: (ICD-10's: K57.609) 63 y.o. male with minimally - unchanged SBO in context of advanced hepatic cirrhosis s/p recent paracentesis without prior abdominal surgical history, complicated by recently bleeding esophageal varices and by comorbidities including alcohol abuse, hepatitis C, liver cancer, hepatic cirrhosis with ascites, HTN, and histories of obesity and anemia.              - nasogastric decompression             - monitor abdominal exam and bowel function             - palliative care consultation appreciated, may require hospice             - patient remains a very poor candidate for surgery  due to ~82% peri-operative mortality as discussed previously with Dr. Dahlia Byes and again with patient and medical team             - transfer to tertiary facility may be considered for second opinion, but options remain limited             - no indication for emergent surgical intervention at this time, consider TPN             - medical management as per primary medical team             - DVT prophylaxis, ambulation encouraged  All of the above findings and recommendations were discussed with the patient, patient's family, and the medical team, and all of patient's and family's  questions were answered to their expressed satisfaction.  Thank you for the opportunity to participate in this patient's care.  -- Marilynne Drivers Rosana Hoes, MD, [place]: [facility] - Partnering for exceptional care. Office: [phone]

## 2018-07-17 NOTE — Consult Note (Signed)
PHARMACY - ADULT TOTAL PARENTERAL NUTRITION CONSULT NOTE   Pharmacy Consult for TPN initation due to small bowel obstruction   Patient Measurements: Height: 6\' 2"  (188 cm) Weight: 218 lb 0.6 oz (98.9 kg) IBW/kg (Calculated) : 82.2 TPN AdjBW (KG): 87.5 Body mass index is 27.99 kg/m. Usual Weight: 98.9 kg IBW 86.3 kg  Assessment:   63 y.o. male presented to [facility] ED on 03/13 for evaluation of hematemesis. Pt with history of alcohol abuse, hepatitis C, liver cancer, liver cirrhosis with ascites. On 03/14 he underwent EGD ad was found to have bleeding esophageal varices which were banded. On 03/17, he was complaining of abdominal pain. CT concerning for small bowel obstruction. Pt with continued abdominal pain; denies any flatus or BM. Abdomen remains distended. NGT in place with 693ml output. Pt with inadequate intake for 7 days   GI:  No nausea, vomiting, diarrhea or abdominal pain Endo: No polyuria, nocturia Insulin requirements in the past 24 hours: none SSI q6h ordered Lytes: K 3.8, Mg 2.2, Phos 2.3 SCr 0.84 Renal: Scr trending down.  Pulm: on room air Cards: No chest pain, orthopnea, edema Hepatobil:Chronic liver cirrhosis with ascites, hepatitis C, thrombocytopenia, elevated liver function test, hypercoagulable state Neuro: No tingling, numbness, weakness.   TPN Access: 07/15/18 TPN start date:07/15/2018 Nutritional Goals (per RD recommendation on 07/15/2018): KCal: 2119-4174 kcal/day Protein:113-124g/day Fluid: 2.1L/day  Goal TPN rate is 83 ml/hr KCal: 2014 kcal/day Protein: 100g/day Fluid: 2292 ml   Plan:  --Continue Clinimix E 5/15 TPN at goal rate of 83 ml/hr+ 20% lipids @25  ml/hr x 12 hrs/day. TG 203. --This TPN provides 48 g of protein, 144 g of dextrose, and 60 g of lipids which provides 1141.6 kCals per day, meeting 57% of patient needs --Electrolytes in TPN: no additional --Add MVI, trace elements + thiamine for 3 days (last day) --continue NS IVMF at 17 ml/hr  for a total IV volume of 18ml/hr  --Will order sodium phophate 20 mmol IV x 1 for Phos 2.3 --Monitor TPN labs --F/U 07/18/2018   Dallie Piles, PharmD 07/17/2018,8:21 AM

## 2018-07-18 ENCOUNTER — Inpatient Hospital Stay: Payer: Medicaid Other

## 2018-07-18 LAB — CBC
HCT: 34.3 % — ABNORMAL LOW (ref 39.0–52.0)
Hemoglobin: 11.9 g/dL — ABNORMAL LOW (ref 13.0–17.0)
MCH: 33.8 pg (ref 26.0–34.0)
MCHC: 34.7 g/dL (ref 30.0–36.0)
MCV: 97.4 fL (ref 80.0–100.0)
PLATELETS: 66 10*3/uL — AB (ref 150–400)
RBC: 3.52 MIL/uL — AB (ref 4.22–5.81)
RDW: 17.5 % — ABNORMAL HIGH (ref 11.5–15.5)
WBC: 12.2 10*3/uL — ABNORMAL HIGH (ref 4.0–10.5)
nRBC: 0 % (ref 0.0–0.2)

## 2018-07-18 LAB — COMPREHENSIVE METABOLIC PANEL
ALT: 37 U/L (ref 0–44)
AST: 56 U/L — ABNORMAL HIGH (ref 15–41)
Albumin: 2.5 g/dL — ABNORMAL LOW (ref 3.5–5.0)
Alkaline Phosphatase: 58 U/L (ref 38–126)
Anion gap: 5 (ref 5–15)
BUN: 24 mg/dL — ABNORMAL HIGH (ref 8–23)
CO2: 25 mmol/L (ref 22–32)
Calcium: 8.4 mg/dL — ABNORMAL LOW (ref 8.9–10.3)
Chloride: 103 mmol/L (ref 98–111)
Creatinine, Ser: 0.84 mg/dL (ref 0.61–1.24)
GFR calc Af Amer: >60 mL/min (ref >60)
GFR calc non Af Amer: >60 mL/min (ref >60)
Glucose, Bld: 121 mg/dL — ABNORMAL HIGH (ref 70–99)
POTASSIUM: 4 mmol/L (ref 3.5–5.1)
Sodium: 133 mmol/L — ABNORMAL LOW (ref 135–145)
Total Bilirubin: 2.9 mg/dL — ABNORMAL HIGH (ref 0.3–1.2)
Total Protein: 6.5 g/dL (ref 6.5–8.1)

## 2018-07-18 LAB — DIFFERENTIAL
Abs Immature Granulocytes: 0.08 10*3/uL — ABNORMAL HIGH (ref 0.00–0.07)
Basophils Absolute: 0 10*3/uL (ref 0.0–0.1)
Basophils Relative: 0 %
Eosinophils Absolute: 0.5 10*3/uL (ref 0.0–0.5)
Eosinophils Relative: 4 %
Immature Granulocytes: 1 %
Lymphocytes Relative: 7 %
Lymphs Abs: 0.9 10*3/uL (ref 0.7–4.0)
Monocytes Absolute: 2 10*3/uL — ABNORMAL HIGH (ref 0.1–1.0)
Monocytes Relative: 16 %
Neutro Abs: 8.8 10*3/uL — ABNORMAL HIGH (ref 1.7–7.7)
Neutrophils Relative %: 72 %

## 2018-07-18 LAB — MAGNESIUM: MAGNESIUM: 2.2 mg/dL (ref 1.7–2.4)

## 2018-07-18 LAB — PHOSPHORUS: Phosphorus: 2.9 mg/dL (ref 2.5–4.6)

## 2018-07-18 LAB — GLUCOSE, CAPILLARY
Glucose-Capillary: 118 mg/dL — ABNORMAL HIGH (ref 70–99)
Glucose-Capillary: 134 mg/dL — ABNORMAL HIGH (ref 70–99)

## 2018-07-18 LAB — PREALBUMIN

## 2018-07-18 LAB — TRIGLYCERIDES: Triglycerides: 70 mg/dL (ref <150)

## 2018-07-18 MED ORDER — CHLORHEXIDINE GLUCONATE 0.12 % MT SOLN
15.0000 mL | Freq: Four times a day (QID) | OROMUCOSAL | Status: DC
  Administered 2018-07-18 (×2): 15 mL via OROMUCOSAL
  Filled 2018-07-18 (×2): qty 15

## 2018-07-18 MED ORDER — FAT EMULSION PLANT BASED 20 % IV EMUL
300.0000 mL | INTRAVENOUS | Status: DC
  Filled 2018-07-18: qty 500

## 2018-07-18 MED ORDER — TRACE MINERALS CR-CU-MN-SE-ZN 10-1000-500-60 MCG/ML IV SOLN
INTRAVENOUS | Status: DC
  Filled 2018-07-18: qty 1992

## 2018-07-18 MED ORDER — IPRATROPIUM-ALBUTEROL 0.5-2.5 (3) MG/3ML IN SOLN: 3.0000 mL | Freq: Two times a day (BID) | RESPIRATORY_TRACT | Status: DC

## 2018-07-18 NOTE — Progress Notes (Signed)
PT Cancellation Note  Patient Details Name: DAXTYN ROTTENBERG MRN: [id number] DOB: 10-04-1955   Cancelled Treatment:    Reason Eval/Treat Not Completed: Other (comment)(Chart reviewed, attempted treatment. Pt refusing at this time, report significant discomfort and dry mouth. Pt reports to feel miserable reporting 'I don't know how much more of this I can take.' Will continue to follow acutely and attempt treatment again at later date time.    2:16 PM, 07/18/18 Etta Grandchild, PT, DPT Physical Therapist - [facility]  [phone] ([place])    Storla C 07/18/2018, 2:14 PM

## 2018-07-18 NOTE — Progress Notes (Signed)
07/18/2018  Subjective: Patient continues with small bowel obstruction.  KUB unchanged with dilated loops of small bowel.  Patient remains distended and with abdominal discomfort.  NG tube in place but no documentation of how much is draining out.  Vital signs: Temp:  [98 F (36.7 C)-98.8 F (37.1 C)] 98 F (36.7 C) (03/22 1944) Pulse Rate:  [90-105] 103 (03/23 0059) Resp:  [18-23] 20 (03/22 1944) BP: (97-114)/(50-71) 101/59 (03/23 0059) SpO2:  [90 %-93 %] 91 % (03/23 0059) Weight:  [101.8 kg] 101.8 kg (03/23 0630)   Intake/Output: 03/22 0701 - 03/23 0700 In: 2691 [P.O.:120; I.V.:2310.4; IV Piggyback:260.6] Out: 1250 [Urine:1250] Last BM Date: 07/16/18  Physical Exam: Constitutional:  No acute distress Abdomen:  Soft, distended, tympanitic, nontender to palpation.  Reducible umbilical hernia.  NG tube in place with gastric contents in cannister.  Labs:  Recent Labs    07/16/18 0422 07/18/18 0542  WBC 8.4 12.2*  HGB 10.3* 11.9*  HCT 31.2* 34.3*  PLT 72* 66*   Recent Labs    07/17/18 0622 07/18/18 0542  NA 133* 133*  K 3.8 4.0  CL 102 103  CO2 25 25  GLUCOSE 129* 121*  BUN 20 24*  CREATININE 0.84 0.84  CALCIUM 8.5* 8.4*   Recent Labs    07/16/18 0422  LABPROT 17.8*  INR 1.5*    Imaging: Dg Abd 1 View  Result Date: 07/18/2018 CLINICAL DATA:  Small bowel obstruction. EXAM: ABDOMEN - 1 VIEW COMPARISON:  Radiographs of July 17, 2018. FINDINGS: Stable small bowel dilatation is noted concerning for distal small bowel obstruction or ileus. No definite colonic dilatation is noted. No abnormal calcifications are noted nasogastric tube tip is seen in proximal stomach. IMPRESSION: Stable small bowel dilatation is noted concerning for distal small bowel obstruction. Electronically Signed   By: Marijo Conception, M.D.   On: 07/18/2018 07:37   Dg Abd 1 View  Result Date: 07/17/2018 CLINICAL DATA:  Increasing left lower quadrant pain EXAM: ABDOMEN - 1 VIEW COMPARISON:   07/17/2018 FINDINGS: Nasogastric catheter is again noted within the stomach. Persistent small bowel dilatation is seen similar to that noted on the prior exam. Paucity of colonic gas is noted. Postsurgical changes in the lumbar spine are seen. IMPRESSION: Stable appearance of small bowel obstruction. Electronically Signed   By: Inez Catalina M.D.   On: 07/17/2018 18:01    Assessment/Plan: This is a 63 y.o. male with small bowel obstruction, in setting of severe liver disease, likely Class C Child-Pugh score.  --Discussed with the patient that this is a tough scenario.  His mortality rate is very high from any surgery that is done due to his severe liver disease.  And [facility] would not be an appropriate place to undertake any surgery for him.  He needs a tertiary care center with hepatology team and transplant team in case of any liver failure issues.   --Discussed with Dr. Estanislado Pandy who will try to initiate transfer options.  Patient does understand that currently with COVID-19, may be more difficult to transfer. --For now, continue NG tube to suction, hydration, monitoring of liver function and electrolytes.     Melvyn Neth, [facility]

## 2018-07-18 NOTE — Progress Notes (Signed)
Pt. continues to rate abdominal pain 10/10. States that pain decreases to  a 7-8 with IV Dilaudid. Requesting pain med every 3 hrs as ordered.  Will continue to monitor.

## 2018-07-18 NOTE — TOC Progression Note (Signed)
Transition of Care [facility]) - Progression Note    Patient Details  Name: NAOL ONTIVEROS MRN: [id number] Date of Birth: 11/15/1955  Transition of Care [facility]) CM/SW Contact  Shela Leff, [place] Phone Number: 07/18/2018, 3:19 PM  Clinical Narrative:   Patient transferring to [place] MSW,LCSW     Expected Discharge Plan: [place] Barriers to Discharge: Continued Medical Work up  Expected Discharge Plan and Services Expected Discharge Plan: [place] arrangements for the past 2 months: Single Family Home Expected Discharge Date: 07/18/18                   HH Arranged: RN, PT, Social Work, Nurse's Aide [place] Agency: [place] [facility])   Social Determinants of Health (SDOH) Interventions    Readmission Risk Interventions No flowsheet data found.

## 2018-07-18 NOTE — Consult Note (Signed)
PHARMACY - ADULT TOTAL PARENTERAL NUTRITION CONSULT NOTE   Pharmacy Consult for TPN initation due to small bowel obstruction   Patient Measurements: Height: 6\' 2"  (188 cm) Weight: 224 lb 6.9 oz (101.8 kg) IBW/kg (Calculated) : 82.2 TPN AdjBW (KG): 87.5 Body mass index is 28.81 kg/m. Usual Weight: 98.9 kg IBW 86.3 kg  Assessment:   63 y.o. male presented to [facility] ED on 03/13 for evaluation of hematemesis. Pt with history of alcohol abuse, hepatitis C, liver cancer, liver cirrhosis with ascites. On 03/14 he underwent EGD ad was found to have bleeding esophageal varices which were banded. On 03/17, he was complaining of abdominal pain. CT concerning for small bowel obstruction. Pt with continued abdominal pain; denies any flatus or BM. Abdomen remains distended. NGT in place with 673ml output. Pt with inadequate intake for 7 days   GI:  No nausea, vomiting, diarrhea or abdominal pain Endo: No polyuria, nocturia Insulin requirements in the past 24 hours: none SSI q6h ordered Lytes: K 4.0, Mg 2.2, Phos 2.9, SCr 0.84 Renal: Scr trending down.  Pulm: on room air Cards: No chest pain, orthopnea, edema Hepatobil:Chronic liver cirrhosis with ascites, hepatitis C, thrombocytopenia, elevated liver function test, hypercoagulable state Neuro: No tingling, numbness, weakness.   TPN Access: 07/15/18 TPN start date:07/15/2018 Nutritional Goals (per RD recommendation on 07/15/2018): KCal: 1194-1740 kcal/day Protein:113-124g/day Fluid: 2.1L/day  Goal TPN rate is 83 ml/hr KCal: 2014 kcal/day Protein: 100g/day Fluid: 2292 ml   Plan:  --Continue Clinimix E 5/15 TPN at goal rate of 83 ml/hr+ 20% lipids @25  ml/hr x 12 hrs/day. TG 203. --This TPN provides 99.6 g of protein, 298.8 g of dextrose, and 60 g of lipids which provides 1954.32 kCals per day, meeting 97% of patient needs --Electrolytes in TPN: no additional --Add MVI, trace elements  --continue NS IVMF at 17 ml/hr for a total IV volume of  138ml/hr  --Monitor TPN labs --F/U 07/19/2018   Oswald Hillock, PharmD, BCPS 07/18/2018,9:38 AM

## 2018-07-18 NOTE — Discharge Summary (Addendum)
[place] [facility] [place] NAME: Bradley Dunlap    MR#:  [id number]  DATE OF BIRTH:  February 06, 1956  DATE OF ADMISSION:  07/08/2018 ADMITTING PHYSICIAN: Nicholes Mango, MD  DATE OF DISCHARGE: 07/18/2018  PRIMARY CARE PHYSICIAN: Ellene Route   ADMISSION DIAGNOSIS:  Hemoptysis [R04.2] Hematemesis, presence of nausea not specified [K92.0]  DISCHARGE DIAGNOSIS:  Active Problems:   Hematemesis   Cirrhosis of liver with ascites (HCC)   SBO (small bowel obstruction) (HCC) COPD GERD Hepatitis C Cirrhosis of liver Hepatocellular carcinoma Esophageal variceal bleeding  SECONDARY DIAGNOSIS:   Past Medical History:  Diagnosis Date  . Alcohol abuse   . Alcohol use   . Anemia   . Anxiety   . Arthritis   . Ascites   . Cancer [facility])    liver cancer  . Chronic hepatitis C ([place])   . COPD (chronic obstructive pulmonary disease) ([place])   . Esophageal varices ([place])   . GERD (gastroesophageal reflux disease)   . Hepatitis C   . Hypertension   . Illiterate    cannot read or write  . Liver cirrhosis ([place])   . Peripheral vascular disease ([place])   . Tobacco use      ADMITTING HISTORY Bradley Dunlap  is a 63 y.o. male with a known history of alcohol abuse, hepatitis C, liver cancer, COPD liver cirrhosis with ascites status post paracentesis few months ago is presenting to the ED with a chief complaint of vomiting and coughing of bright red blood.  Some abdominal discomfort is present associated with nausea but denies any black tarry stool.  Hemoglobin dropped from 15.1 in the past to 13.3.  Patient endorses that he stopped drinking alcohol few months ago but still continues to smoke.  No family members at bedside.  HOSPITAL COURSE:  Patient was admitted to medical floor.  GI consultation was done for hematemesis.  Patient was started on IV Protonix drip and IV octreotide drip.  Upper endoscopy and esophageal variceal banding was done to control the  gastrointestinal bleeding.  Patient tolerated the procedure well.  Subsequently during hospitalization patient developed abdominal pain and distention.  He was worked up with CT abdomen and imaging studies which showed bowel obstruction.  Surgery consult was done.  Patient was put on NG tube with suction and conservative management was started.  Serial abdominal x-rays were done.  No resolution of bowel obstruction.  Discussed with patient and family regarding the management options.  They wanted surgical evaluation from [facility] and possible surgery.  Surgical team at our hospital suggested transfer to tertiary center with hepatology backup for possible surgery.  Discussed with [facility] Kinnie Scales at [facility].  Patient has been accepted.  He will be transferred today.  Family has been updated.  Blood cultures did not reveal any growth during the hospitalization.  Patient has a meld score of 33 with 66-month mortality rate of 52 percent.  Palliative care consult has been done.Patient received nutrition via TPN.   CONSULTS OBTAINED:  Treatment Team:  Lin Landsman, MD Jules Husbands, MD  DRUG ALLERGIES:   Allergies  Allergen Reactions  . Codeine Itching and Other (See Comments)    Other reaction(s): Other (See Comments) Back bone itching    . Indomethacin Itching    Other reaction(s): ITCHING    DISCHARGE MEDICATIONS:   Allergies as of 07/18/2018      Reactions   Codeine Itching, Other (See Comments)   Other reaction(s):  Other (See Comments) Back bone itching    Indomethacin Itching   Other reaction(s): ITCHING      Medication List    STOP taking these medications   clonazePAM 0.5 MG tablet Commonly known as:  KLONOPIN   esomeprazole 40 MG capsule Commonly known as:  NEXIUM   furosemide 40 MG tablet Commonly known as:  LASIX   propranolol 10 MG tablet Commonly known as:  INDERAL   traMADol 50 MG tablet Commonly known as:  ULTRAM     TAKE these  medications   albuterol (2.5 MG/3ML) 0.083% nebulizer solution Commonly known as:  PROVENTIL Take 3 mLs (2.5 mg total) by nebulization every 4 (four) hours as needed for wheezing.   albuterol 108 (90 Base) MCG/ACT inhaler Commonly known as:  PROVENTIL HFA;VENTOLIN HFA Inhale 2 puffs into the lungs every 4 (four) hours as needed for wheezing.   Fluticasone-Salmeterol 250-50 MCG/DOSE Aepb Commonly known as:  Advair Diskus Inhale 1 puff into the lungs 2 (two) times daily.   tiotropium 18 MCG inhalation capsule Commonly known as:  Spiriva HandiHaler Place 1 capsule (18 mcg total) into inhaler and inhale daily.       Today  Patient seen today Has abdominal distention No bowel movement yet No bowel sounds heard Has NG tube with suction  VITAL SIGNS:  Blood pressure 120/74, pulse (!) 107, temperature 98 F (36.7 C), temperature source Oral, resp. rate 20, height 6\' 2"  (1.88 m), weight 101.8 kg, SpO2 93 %.  I/O:    Intake/Output Summary (Last 24 hours) at 07/18/2018 1327 Last data filed at 07/18/2018 1116 Gross per 24 hour  Intake 3389.25 ml  Output 1350 ml  Net 2039.25 ml    PHYSICAL EXAMINATION:  Physical Exam  GENERAL:  63 y.o.-year-old patient lying in the bed with no acute distress.  LUNGS: Normal breath sounds bilaterally, no wheezing, rales,rhonchi or crepitation. No use of accessory muscles of respiration.  CARDIOVASCULAR: S1, S2 normal. No murmurs, rubs, or gallops.  ABDOMEN: Soft, tenderness noted in abdomen around umbilicus,distended. Bowel sounds absent. No organomegaly or mass.  NEUROLOGIC: Moves all 4 extremities. PSYCHIATRIC: The patient is alert and oriented x 3.  SKIN: No obvious rash, lesion, or ulcer.   DATA REVIEW:   CBC Recent Labs  Lab 07/18/18 0542  WBC 12.2*  HGB 11.9*  HCT 34.3*  PLT 66*    Chemistries  Recent Labs  Lab 07/18/18 0542  NA 133*  K 4.0  CL 103  CO2 25  GLUCOSE 121*  BUN 24*  CREATININE 0.84  CALCIUM 8.4*  MG 2.2   AST 56*  ALT 37  ALKPHOS 58  BILITOT 2.9*    Cardiac Enzymes No results for input(s): TROPONINI in the last 168 hours.  Microbiology Results  Results for orders placed or performed during the hospital encounter of 07/08/18  Body fluid culture     Status: None   Collection Time: 07/11/18  9:23 AM  Result Value Ref Range Status   Specimen Description   Final    PERITONEAL Performed at [facility], [address] [place], [place] [address]    Special Requests   Final    NONE Performed at [facility], [address] [place], [place] [address]    Gram Stain   Final    WBC PRESENT,BOTH PMN AND MONONUCLEAR NO ORGANISMS SEEN    Culture   Final    NO GROWTH 3 DAYS Performed at [facility], [place]  [address], [place], [place] [address]    Report Status 07/14/2018 FINAL  Final    RADIOLOGY:  Dg Abd 1 View  Result Date: 07/18/2018 CLINICAL DATA:  Small bowel obstruction. EXAM: ABDOMEN - 1 VIEW COMPARISON:  Radiographs of July 17, 2018. FINDINGS: Stable small bowel dilatation is noted concerning for distal small bowel obstruction or ileus. No definite colonic dilatation is noted. No abnormal calcifications are noted nasogastric tube tip is seen in proximal stomach. IMPRESSION: Stable small bowel dilatation is noted concerning for distal small bowel obstruction. Electronically Signed   By: Marijo Conception, M.D.   On: 07/18/2018 07:37   Dg Abd 1 View  Result Date: 07/17/2018 CLINICAL DATA:  Increasing left lower quadrant pain EXAM: ABDOMEN - 1 VIEW COMPARISON:  07/17/2018 FINDINGS: Nasogastric catheter is again noted within the stomach. Persistent small bowel dilatation is seen similar to that noted on the prior exam. Paucity of colonic gas is noted. Postsurgical changes in the lumbar spine are seen. IMPRESSION: Stable appearance of small bowel obstruction. Electronically Signed   By: Inez Catalina M.D.   On: 07/17/2018 18:01   Dg Abd 1 View  Result Date:  07/17/2018 CLINICAL DATA:  Abdominal distension.  NG tube placement yesterday. EXAM: ABDOMEN - 1 VIEW COMPARISON:  07/16/2018 and 07/14/2018 FINDINGS: Nasogastric tube is unchanged with tip within the stomach over the midline upper abdomen. No change in multiple air-filled dilated small bowel loops. No free peritoneal air. Remainder of the exam is unchanged. IMPRESSION: No significant change in multiple air-filled dilated small bowel loops compatible with small bowel obstruction. Nasogastric tube unchanged with tip over the stomach in the midline upper abdomen. Electronically Signed   By: Marin Olp M.D.   On: 07/17/2018 07:43    Follow up with PCP in 1 week.  Management plans discussed with the patient, family and they are in agreement.  CODE STATUS: Full code    Code Status Orders  (From admission, onward)         Start     Ordered   07/08/18 2150  Full code  Continuous     07/08/18 2150        Code Status History    Date Active Date Inactive Code Status Order ID Comments User Context   01/13/2017 0632 01/14/2017 1829 Full Code [id number]  Saundra Shelling, MD Inpatient   01/03/2017 1746 01/05/2017 1547 Full Code [id number]  Gladstone Lighter, MD Inpatient   12/24/2016 1337 12/27/2016 1834 Full Code [id number]  Fritzi Mandes, MD Inpatient      TOTAL TIME TAKING CARE OF THIS PATIENT ON DAY OF DISCHARGE: more than 38 minutes.   Saundra Shelling M.D on 07/18/2018 at 1:27 PM  Between 7am to 6pm - Pager - [phone]  After 6pm go to [email or web address] - password [email or web address] [place] [facility]  Office  [phone]  CC: Primary care physician; Ellene Route  Note: This dictation was prepared with Dragon dictation along with smaller phrase technology. Any transcriptional errors that result from this process are unintentional.

## 2018-07-18 NOTE — Progress Notes (Signed)
Patient has a bed at [facility]. Report called to [place], [place]. All questions answered. Sister Pleas Koch made aware and updated, she was also made aware that there would be no exceptions made to visit him here or at [facility].  Patient updated, bathed, and transport called. MD wants TPN to be d/c'd at transport.  Will continue to monitor until transport.

## 2018-07-18 NOTE — Progress Notes (Signed)
EMS arrived to transport patient to [facility]. Patient given pain medication and nausea given prior to transport. All belongings and paperwork sent with patient. Sister Pleas Koch called to update.

## 2018-07-27 DEATH — deceased

## 2019-05-07 IMAGING — DX ABDOMEN - 1 VIEW
2 series · 2 of 2 positions shown · non-contrast
Comparison: 07/16/2018 and 07/14/2018

CLINICAL DATA: Abdominal distension.  NG tube placement yesterday.

EXAM:
ABDOMEN - 1 VIEW

[abdomen supine (1 of 2)]
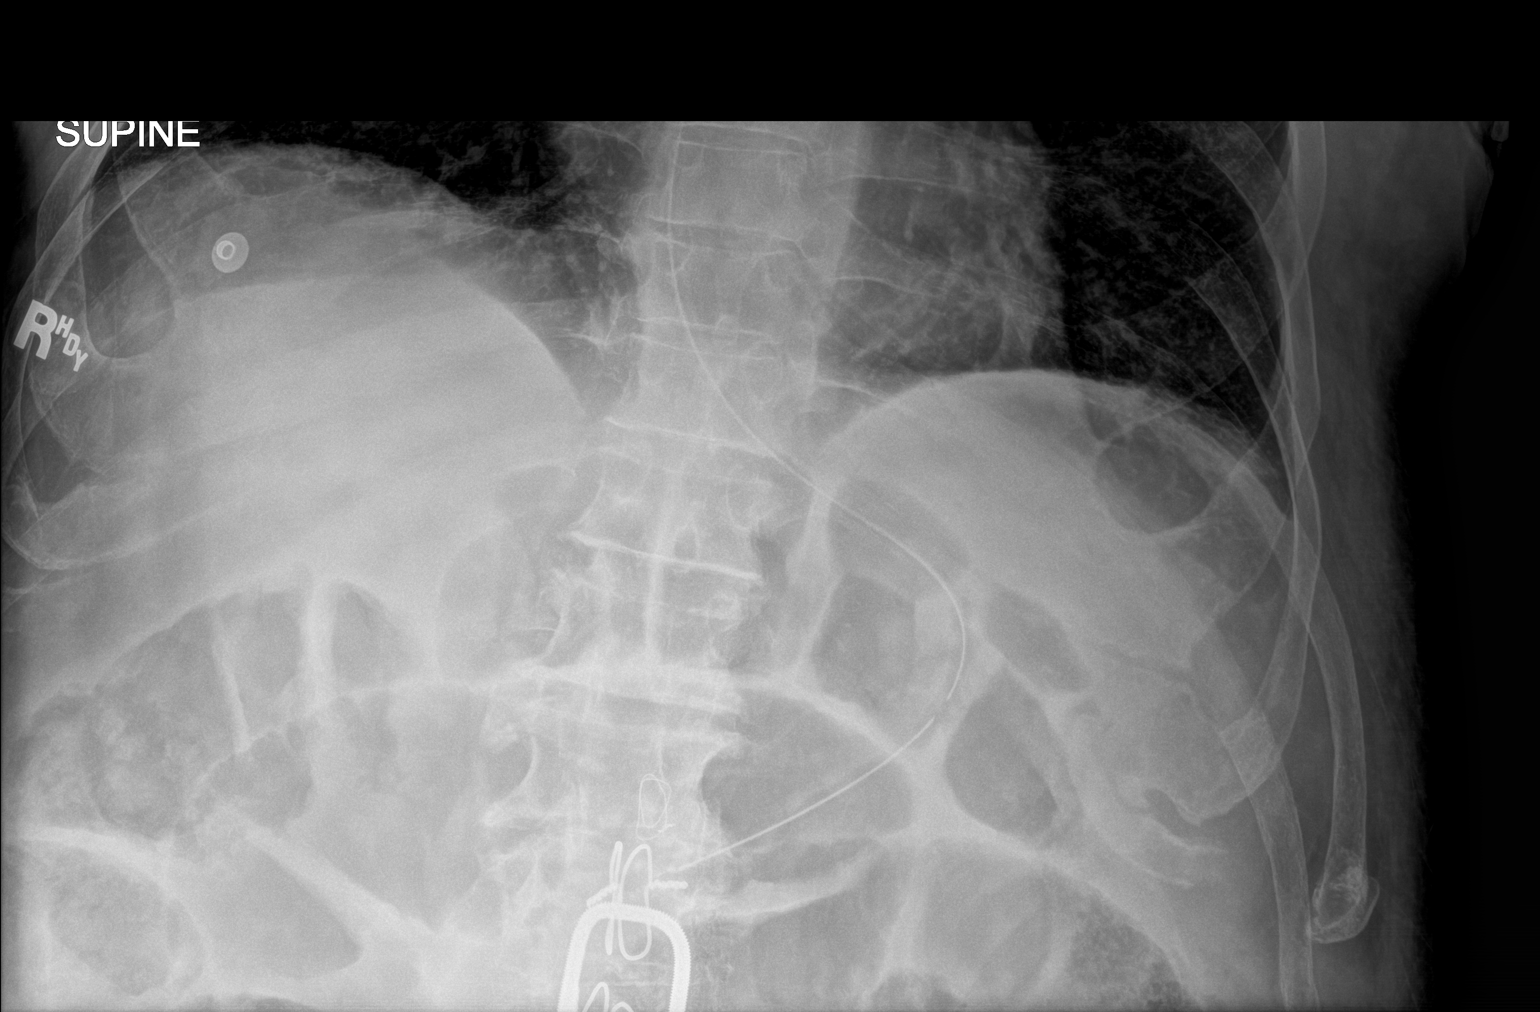

[abdomen supine (2 of 2)]
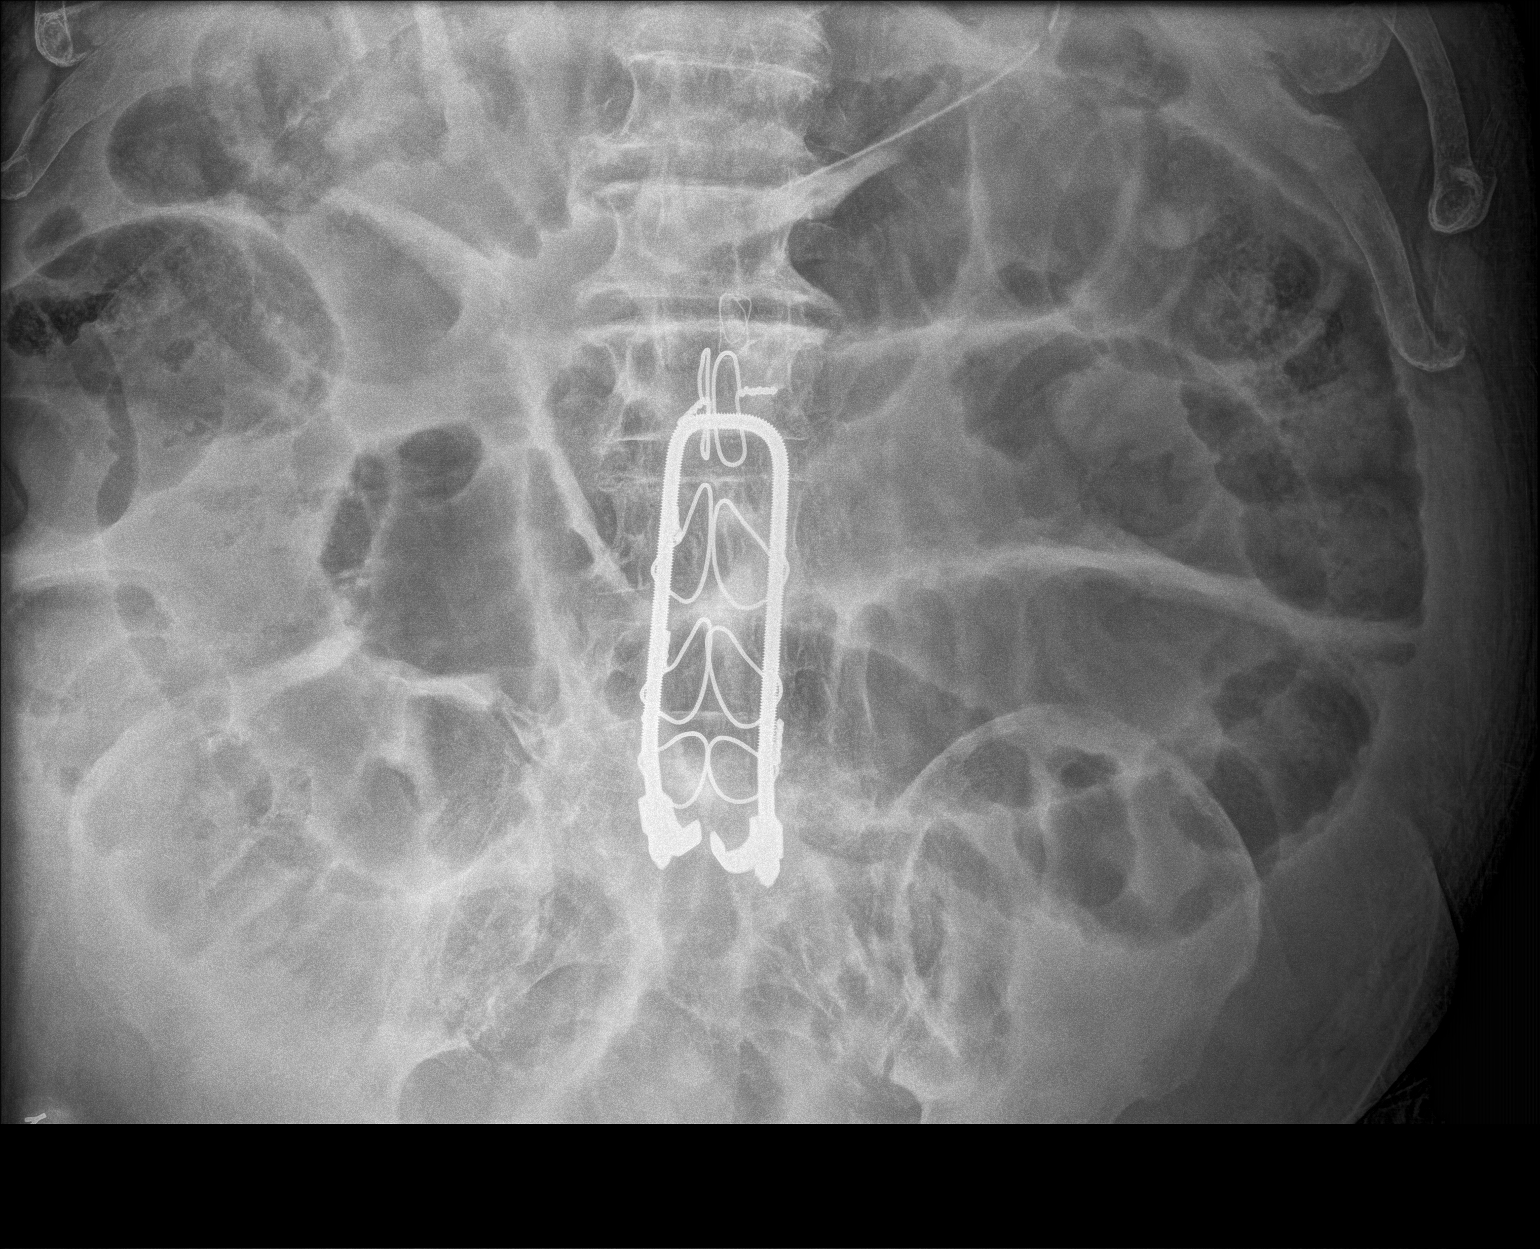

[2 of 2 positions shown; findings below may reference images not displayed]

FINDINGS: Nasogastric tube is unchanged with tip within the stomach over the
midline upper abdomen. No change in multiple air-filled dilated
small bowel loops. No free peritoneal air. Remainder of the exam is
unchanged.
IMPRESSION: No significant change in multiple air-filled dilated small bowel
loops compatible with small bowel obstruction.

Nasogastric tube unchanged with tip over the stomach in the midline
upper abdomen.
